# Patient Record
Sex: Female | Born: 1990 | Race: Black or African American | Hispanic: No | Marital: Single | Smoking: Never smoker
Health system: Southern US, Community
[De-identification: ages and names within clinical notes are randomized; demographics above are authoritative.]

## PROBLEM LIST (undated history)

## (undated) ENCOUNTER — Inpatient Hospital Stay (HOSPITAL_COMMUNITY): Payer: Self-pay

## (undated) DIAGNOSIS — N83209 Unspecified ovarian cyst, unspecified side: Secondary | ICD-10-CM

## (undated) DIAGNOSIS — R55 Syncope and collapse: Secondary | ICD-10-CM

## (undated) DIAGNOSIS — D649 Anemia, unspecified: Secondary | ICD-10-CM

## (undated) DIAGNOSIS — R011 Cardiac murmur, unspecified: Secondary | ICD-10-CM

## (undated) HISTORY — DX: Syncope and collapse: R55

## (undated) HISTORY — DX: Cardiac murmur, unspecified: R01.1

## (undated) HISTORY — PX: NO PAST SURGERIES: SHX2092

---

## 2011-06-13 ENCOUNTER — Emergency Department (HOSPITAL_COMMUNITY)
Admission: EM | Admit: 2011-06-13 | Discharge: 2011-06-13 | Disposition: A | Discharge status: Home / Self Care | Payer: Self-pay | Attending: Emergency Medicine | Admitting: Emergency Medicine

## 2011-06-13 ENCOUNTER — Encounter (HOSPITAL_COMMUNITY): Payer: Self-pay | Admitting: *Deleted

## 2011-06-13 ENCOUNTER — Other Ambulatory Visit: Payer: Self-pay

## 2011-06-13 ENCOUNTER — Emergency Department (HOSPITAL_COMMUNITY): Payer: Self-pay

## 2011-06-13 DIAGNOSIS — K219 Gastro-esophageal reflux disease without esophagitis: Secondary | ICD-10-CM | POA: Insufficient documentation

## 2011-06-13 DIAGNOSIS — R10816 Epigastric abdominal tenderness: Secondary | ICD-10-CM | POA: Insufficient documentation

## 2011-06-13 DIAGNOSIS — R0602 Shortness of breath: Secondary | ICD-10-CM | POA: Insufficient documentation

## 2011-06-13 DIAGNOSIS — R079 Chest pain, unspecified: Secondary | ICD-10-CM | POA: Insufficient documentation

## 2011-06-13 MED ORDER — GI COCKTAIL ~~LOC~~
30.0000 mL | Freq: Once | ORAL | Status: AC
  Administered 2011-06-13: 30 mL via ORAL
  Filled 2011-06-13: qty 30

## 2011-06-13 MED ORDER — FAMOTIDINE 20 MG PO TABS
40.0000 mg | ORAL_TABLET | Freq: Once | ORAL | Status: AC
  Administered 2011-06-13: 40 mg via ORAL
  Filled 2011-06-13: qty 1

## 2011-06-13 MED ORDER — SUCRALFATE 1 G PO TABS: 1.0000 g | ORAL_TABLET | Freq: Four times a day (QID) | ORAL | Status: DC

## 2011-06-13 MED ORDER — IBUPROFEN 800 MG PO TABS
800.0000 mg | ORAL_TABLET | Freq: Once | ORAL | Status: AC
  Administered 2011-06-13: 800 mg via ORAL
  Filled 2011-06-13: qty 1

## 2011-06-13 MED ORDER — PANTOPRAZOLE SODIUM 40 MG PO TBEC: 40.0000 mg | DELAYED_RELEASE_TABLET | Freq: Every day | ORAL | Status: DC

## 2011-06-13 NOTE — ED Notes (Signed)
Performed and gave pt EKG to A.Allen,EDP

## 2011-06-13 NOTE — ED Notes (Signed)
Pt from home c/o chest pain, denies radiation but states that "it is hard to breathe sometimes", started yesterday morning upon awakening, denies injury.

## 2011-06-13 NOTE — ED Provider Notes (Signed)
History     CSN: [id number]  Arrival date & time 06/13/11  1027   First MD Initiated Contact with Patient 06/13/11 1100      Chief Complaint  Patient presents with  . Chest Pain    (Consider location/radiation/quality/duration/timing/severity/associated sxs/prior treatment) Patient is a 21 y.o. female presenting with chest pain. The history is provided by the patient.  Chest Pain    patient here with epigastric pain x24 hours. No cough or fever. Pain is described as burning. No associated belching or increased flatus. No pleuritic pain. Denies any leg swelling. No treatment used prior to arrival no history of same. No vomiting or diarrhea or urinary symptoms.  History reviewed. No pertinent past medical history.  History reviewed. No pertinent past surgical history.  History reviewed. No pertinent family history.  History  Substance Use Topics  . Smoking status: Never Smoker   . Smokeless tobacco: Never Used  . Alcohol Use: No    OB History    Grav Para Term Preterm Abortions TAB SAB Ect Mult Living                  Review of Systems  Cardiovascular: Positive for chest pain.  All other systems reviewed and are negative.    Allergies  Review of patient's allergies indicates no known allergies.  Home Medications  No current outpatient prescriptions on file.  BP 107/66  Pulse 70  Temp(Src) 98.7 F (37.1 C) (Oral)  Resp 16  SpO2 100%  Physical Exam  Nursing note and vitals reviewed. Constitutional: She is oriented to person, place, and time. She appears well-developed and well-nourished.  Non-toxic appearance. No distress.  HENT:  Head: Normocephalic and atraumatic.  Eyes: Conjunctivae, EOM and lids are normal. Pupils are equal, round, and reactive to light.  Neck: Normal range of motion. Neck supple. No tracheal deviation present. No mass present.  Cardiovascular: Normal rate, regular rhythm and normal heart sounds.  Exam reveals no gallop.   No murmur  heard. Pulmonary/Chest: Effort normal and breath sounds normal. No stridor. No respiratory distress. She has no decreased breath sounds. She has no wheezes. She has no rhonchi. She has no rales.  Abdominal: Soft. Normal appearance and bowel sounds are normal. She exhibits no distension. There is tenderness in the epigastric area. There is no rebound and no CVA tenderness.  Musculoskeletal: Normal range of motion. She exhibits no edema and no tenderness.  Neurological: She is alert and oriented to person, place, and time. She has normal strength. No cranial nerve deficit or sensory deficit. GCS eye subscore is 4. GCS verbal subscore is 5. GCS motor subscore is 6.  Skin: Skin is warm and dry. No abrasion and no rash noted.  Psychiatric: She has a normal mood and affect. Her speech is normal and behavior is normal.    ED Course  Procedures (including critical care time)  Labs Reviewed - No data to display No results found.   No diagnosis found.    MDM   Date: 06/13/2011  Rate: 65  Rhythm: normal sinus rhythm  QRS Axis: normal  Intervals: normal  ST/T Wave abnormalities: normal  Conduction Disutrbances:none  Narrative Interpretation:   Old EKG Reviewed: none available  12:54 PM Pt given meds and feels better, suspect gerd, doubt pe--will give ppi      Toy Baker, MD 06/13/11 1255

## 2011-08-18 ENCOUNTER — Other Ambulatory Visit: Payer: Self-pay | Admitting: Infectious Diseases

## 2011-08-18 ENCOUNTER — Ambulatory Visit
Admission: RE | Admit: 2011-08-18 | Discharge: 2011-08-18 | Disposition: A | Discharge status: Home / Self Care | Payer: PRIVATE HEALTH INSURANCE | Source: Ambulatory Visit | Attending: Infectious Diseases | Admitting: Infectious Diseases

## 2011-08-18 DIAGNOSIS — R7611 Nonspecific reaction to tuberculin skin test without active tuberculosis: Secondary | ICD-10-CM

## 2011-12-10 ENCOUNTER — Encounter (HOSPITAL_COMMUNITY): Payer: Self-pay | Admitting: Emergency Medicine

## 2011-12-10 ENCOUNTER — Emergency Department (HOSPITAL_COMMUNITY)
Admission: EM | Admit: 2011-12-10 | Discharge: 2011-12-10 | Disposition: A | Discharge status: Home / Self Care | Payer: Self-pay | Attending: Emergency Medicine | Admitting: Emergency Medicine

## 2011-12-10 DIAGNOSIS — B86 Scabies: Secondary | ICD-10-CM | POA: Insufficient documentation

## 2011-12-10 MED ORDER — DIPHENHYDRAMINE HCL 25 MG PO TABS: 50.0000 mg | ORAL_TABLET | Freq: Four times a day (QID) | ORAL | Status: DC

## 2011-12-10 MED ORDER — PERMETHRIN 5 % EX CREA: TOPICAL_CREAM | CUTANEOUS | Status: AC

## 2011-12-10 NOTE — ED Provider Notes (Signed)
History     CSN: [id number]  Arrival date & time 12/10/11  1229   First MD Initiated Contact with Patient 12/10/11 1242      Chief Complaint  Patient presents with  . Rash    (Consider location/radiation/quality/duration/timing/severity/associated sxs/prior treatment) HPI Pt presents with c/o itchy rash.  Pt states rash began several days ago.  She initially thought they were small bug bites and are spreading.  Over arms, ankles, waist band.  No fever/chills.  She has not tried anything prior to arrival.  There are no other associated systemic symptoms, there are no other alleviating or modifying factors.  She has had no recent travel.  No household contacts have similar symptoms.   History reviewed. No pertinent past medical history.  History reviewed. No pertinent past surgical history.  History reviewed. No pertinent family history.  History  Substance Use Topics  . Smoking status: Never Smoker   . Smokeless tobacco: Never Used  . Alcohol Use: No    OB History    Grav Para Term Preterm Abortions TAB SAB Ect Mult Living                  Review of Systems ROS reviewed and all otherwise negative except for mentioned in HPI  Allergies  Review of patient's allergies indicates no known allergies.  Home Medications   Current Outpatient Rx  Name Route Sig Dispense Refill  . DIPHENHYDRAMINE HCL 25 MG PO TABS Oral Take 2 tablets (50 mg total) by mouth every 6 (six) hours. Take 1-2 tablets every 6 hours x 2 days, then space out to an as needed basis 20 tablet 0  . PERMETHRIN 5 % EX CREA  Apply to affected area once leave on 8-12, then rinse off 60 g 0    BP 116/72  Pulse 70  Temp 98.4 F (36.9 C) (Oral)  Resp 18  SpO2 100% Vitals reviewed Physical Exam Physical Examination: General appearance - alert, well appearing, and in no distress Mental status - alert, oriented to person, place, and time Eyes - no conjunctival injection, no scleral icterus Chest - clear to  auscultation, no wheezes, rales or rhonchi, symmetric air entry Heart - normal rate, regular rhythm, normal S1, S2, no murmurs, rubs, clicks or gallops Musculoskeletal - no joint tenderness, deformity or swelling Extremities - peripheral pulses normal, no pedal edema, no clubbing or cyanosis Skin - normal coloration, flesh colored papules over arms, lower back around waist band, ankles  ED Course  Procedures (including critical care time)  Labs Reviewed - No data to display No results found.   1. Scabies       MDM  Pt presentts with rash c/w scabies.  Pt given permethrin rx and explained its applications.  Discharged with strict return precautions.  Pt agreeable with plan.        Ethelda Chick, MD 12/11/11 1345

## 2011-12-10 NOTE — ED Notes (Signed)
Pt c/o generalized body rash with bug bites that are itchy x several days; pt sts mostly on arms and face

## 2012-02-27 ENCOUNTER — Emergency Department (HOSPITAL_COMMUNITY)
Admission: EM | Admit: 2012-02-27 | Discharge: 2012-02-27 | Disposition: A | Discharge status: Home / Self Care | Payer: Self-pay | Attending: Emergency Medicine | Admitting: Emergency Medicine

## 2012-02-27 ENCOUNTER — Encounter (HOSPITAL_COMMUNITY): Payer: Self-pay | Admitting: *Deleted

## 2012-02-27 DIAGNOSIS — N921 Excessive and frequent menstruation with irregular cycle: Secondary | ICD-10-CM | POA: Insufficient documentation

## 2012-02-27 LAB — POCT PREGNANCY, URINE: Preg Test, Ur: NEGATIVE

## 2012-02-27 NOTE — ED Notes (Signed)
Reports concern due to menstrual cycle stopped on Sept. 7th however, started back on the 11th and still present. Stated 4 days ago just started  having to use 1-2pad/day.

## 2012-02-27 NOTE — ED Notes (Signed)
Pt reports vaginal bleeding starting September 11th.  Pt reports periods are typically regular and on the 21st. Pt reports last menstrual cycle started on the 11th, stopped for two days and bleeding then began again.  Pt denies stomach pain or cramping.  Pt denies being sexually active.  Pt states blood amount is spotting- bright red/dark red in color.

## 2012-02-27 NOTE — ED Provider Notes (Signed)
History     CSN: [id number]  Arrival date & time 02/27/12  1305   First MD Initiated Contact with Patient 02/27/12 1352      Chief Complaint  Patient presents with  . Vaginal Bleeding    (Consider location/radiation/quality/duration/timing/severity/associated sxs/prior treatment) The history is provided by the patient.  pt c/o irregular periods.   She denies pain.  She denies n/v. She denies uti sxs or vag discharge.  She is not sexually active.  She denies hx of similar sxs. She denies fh of fibroids, endometriosis or other gyn dz.   She has not had bleeding anywhere else. She denies rash or bruise.  History reviewed. No pertinent past medical history.  History reviewed. No pertinent past surgical history.  No family history on file.  History  Substance Use Topics  . Smoking status: Never Smoker   . Smokeless tobacco: Never Used  . Alcohol Use: No    OB History    Grav Para Term Preterm Abortions TAB SAB Ect Mult Living                  Review of Systems  Constitutional: Negative for fever and chills.  HENT: Negative for nosebleeds.   Respiratory: Negative for shortness of breath.   Cardiovascular: Negative for chest pain and palpitations.  Gastrointestinal: Negative for abdominal pain and blood in stool.  Genitourinary: Negative for hematuria.  Musculoskeletal: Negative for back pain.  Skin: Negative for pallor and rash.  Neurological: Negative for weakness.  Hematological: Does not bruise/bleed easily.  All other systems reviewed and are negative.    Allergies  Review of patient's allergies indicates no known allergies.  Home Medications  No current outpatient prescriptions on file.  BP 122/70  Pulse 89  Temp 99.4 F (37.4 C) (Oral)  Resp 19  SpO2 100%  LMP 02/09/2012  Physical Exam  Nursing note and vitals reviewed. Constitutional: She is oriented to person, place, and time. She appears well-developed and well-nourished.  HENT:  Head:  Normocephalic and atraumatic.  Eyes: Conjunctivae normal are normal.       No conjunctival pallor  Neck: Normal range of motion.  Cardiovascular: Normal rate, regular rhythm and intact distal pulses.   No murmur heard.      CRF < 2 sec  Abdominal: Soft. Bowel sounds are normal. She exhibits no distension and no mass. There is no tenderness. There is no rebound and no guarding.  Genitourinary:       Pelvic done with female med tech  Ext vagina nl.  Min amt of old brown vag bleeding. Os closed. No adnexal masses or cmt.  No vag discharge.  Musculoskeletal: Normal range of motion. She exhibits no edema.  Neurological: She is alert and oriented to person, place, and time.  Skin: Skin is warm and dry. No pallor.  Psychiatric: She has a normal mood and affect. Thought content normal.    ED Course  Procedures (including critical care time) 40 y female with irregular vag bleeding. No orthostatic sxs. No pain. Not sexually active. Not pregnant.    Labs Reviewed  POCT PREGNANCY, URINE   No results found.   No diagnosis found.    MDM  Irregular menses        Cheri Guppy, MD 02/27/12 1425  Cheri Guppy, MD 02/27/12 1446

## 2014-11-20 ENCOUNTER — Ambulatory Visit (INDEPENDENT_AMBULATORY_CARE_PROVIDER_SITE_OTHER): Payer: 59 | Admitting: Physician Assistant

## 2014-11-20 VITALS — BP 106/68 | HR 58 | Temp 98.5°F | Resp 18 | Ht 61.5 in | Wt 117.6 lb

## 2014-11-20 DIAGNOSIS — Z23 Encounter for immunization: Secondary | ICD-10-CM | POA: Diagnosis not present

## 2014-11-20 DIAGNOSIS — R6889 Other general symptoms and signs: Secondary | ICD-10-CM | POA: Diagnosis not present

## 2014-11-20 LAB — POCT CBC
GRANULOCYTE PERCENT: 56.7 % (ref 37–80)
HCT, POC: 40.8 % (ref 37.7–47.9)
Hemoglobin: 13.2 g/dL (ref 12.2–16.2)
Lymph, poc: 2.3 (ref 0.6–3.4)
MCH, POC: 29.4 pg (ref 27–31.2)
MCHC: 32.3 g/dL (ref 31.8–35.4)
MCV: 91 fL (ref 80–97)
MID (CBC): 0.2 (ref 0–0.9)
MPV: 8.7 fL (ref 0–99.8)
PLATELET COUNT, POC: 213 10*3/uL (ref 142–424)
POC GRANULOCYTE: 3.2 (ref 2–6.9)
POC LYMPH %: 40.6 % (ref 10–50)
POC MID %: 2.7 % (ref 0–12)
RBC: 4.48 M/uL (ref 4.04–5.48)
RDW, POC: 13.5 %
WBC: 5.7 10*3/uL (ref 4.6–10.2)

## 2014-11-20 LAB — TSH: TSH: 0.752 u[IU]/mL (ref 0.350–4.500)

## 2014-11-20 NOTE — Patient Instructions (Addendum)
You are not anemic. I will call you with results of thyroid test. Return with further problems/concerns.  Tdap Vaccine (Tetanus, Diphtheria, Pertussis): What You Need to Know 1. Why get vaccinated? Tetanus, diphtheria and pertussis can be very serious diseases, even for adolescents and adults. Tdap vaccine can protect [place] from these diseases. TETANUS (Lockjaw) causes painful muscle tightening and stiffness, usually all over the body.  It can lead to tightening of muscles in the head and neck so you can't open your mouth, swallow, or sometimes even breathe. Tetanus kills about 1 out of 5 people who are infected. DIPHTHERIA can cause a thick coating to form in the back of the throat.  It can lead to breathing problems, paralysis, heart failure, and death. PERTUSSIS (Whooping Cough) causes severe coughing spells, which can cause difficulty breathing, vomiting and disturbed sleep.  It can also lead to weight loss, incontinence, and rib fractures. Up to 2 in 100 adolescents and 5 in 100 adults with pertussis are hospitalized or have complications, which could include pneumonia or death. These diseases are caused by bacteria. Diphtheria and pertussis are spread from person to person through coughing or sneezing. Tetanus enters the body through cuts, scratches, or wounds. Before vaccines, the [place] saw as many as 200,000 cases a year of diphtheria and pertussis, and hundreds of cases of tetanus. Since vaccination began, tetanus and diphtheria have dropped by about 99% and pertussis by about 80%. 2. Tdap vaccine Tdap vaccine can protect adolescents and adults from tetanus, diphtheria, and pertussis. One dose of Tdap is routinely given at age 53 or 58. People who did not get Tdap at that age should get it as soon as possible. Tdap is especially important for health care professionals and anyone having close contact with a baby younger than 12 months. Pregnant women should get a dose of Tdap during  every pregnancy, to protect the newborn from pertussis. Infants are most at risk for severe, life-threatening complications from pertussis. A similar vaccine, called Td, protects from tetanus and diphtheria, but not pertussis. A Td booster should be given every 10 years. Tdap may be given as one of these boosters if you have not already gotten a dose. Tdap may also be given after a severe cut or burn to prevent tetanus infection. Your doctor can give you more information. Tdap may safely be given at the same time as other vaccines. 3. Some people should not get this vaccine  If you ever had a life-threatening allergic reaction after a dose of any tetanus, diphtheria, or pertussis containing vaccine, OR if you have a severe allergy to any part of this vaccine, you should not get Tdap. Tell your doctor if you have any severe allergies.  If you had a coma, or long or multiple seizures within 7 days after a childhood dose of DTP or DTaP, you should not get Tdap, unless a cause other than the vaccine was found. You can still get Td.  Talk to your doctor if you:  have epilepsy or another nervous system problem,  had severe pain or swelling after any vaccine containing diphtheria, tetanus or pertussis,  ever had Guillain-Barr Syndrome (GBS),  aren't feeling well on the day the shot is scheduled. 4. Risks of a vaccine reaction With any medicine, including vaccines, there is a chance of side effects. These are usually mild and go away on their own, but serious reactions are also possible. Brief fainting spells can follow a vaccination, leading to injuries from falling.  Sitting or lying down for about 15 minutes can help prevent these. Tell your doctor if you feel dizzy or light-headed, or have vision changes or ringing in the ears. Mild problems following Tdap (Did not interfere with activities)  Pain where the shot was given (about 3 in 4 adolescents or 2 in 3 adults)  Redness or swelling where  the shot was given (about 1 person in 5)  Mild fever of at least 100.31F (up to about 1 in 25 adolescents or 1 in 100 adults)  Headache (about 3 or 4 people in 10)  Tiredness (about 1 person in 3 or 4)  Nausea, vomiting, diarrhea, stomach ache (up to 1 in 4 adolescents or 1 in 10 adults)  Chills, body aches, sore joints, rash, swollen glands (uncommon) Moderate problems following Tdap (Interfered with activities, but did not require medical attention)  Pain where the shot was given (about 1 in 5 adolescents or 1 in 100 adults)  Redness or swelling where the shot was given (up to about 1 in 16 adolescents or 1 in 25 adults)  Fever over 102F (about 1 in 100 adolescents or 1 in 250 adults)  Headache (about 3 in 20 adolescents or 1 in 10 adults)  Nausea, vomiting, diarrhea, stomach ache (up to 1 or 3 people in 100)  Swelling of the entire arm where the shot was given (up to about 3 in 100). Severe problems following Tdap (Unable to perform usual activities; required medical attention)  Swelling, severe pain, bleeding and redness in the arm where the shot was given (rare). A severe allergic reaction could occur after any vaccine (estimated less than 1 in a million doses). 5. What if there is a serious reaction? What should I look for?  Look for anything that concerns you, such as signs of a severe allergic reaction, very high fever, or behavior changes. Signs of a severe allergic reaction can include hives, swelling of the face and throat, difficulty breathing, a fast heartbeat, dizziness, and weakness. These would start a few minutes to a few hours after the vaccination. What should I do?  If you think it is a severe allergic reaction or other emergency that can't wait, call 9-1-1 or get the person to the nearest hospital. Otherwise, call your doctor.  Afterward, the reaction should be reported to the "Vaccine Adverse Event Reporting System" (VAERS). Your doctor might file this  report, or you can do it yourself through the VAERS web site at www.vaers.LAgents.no, or by calling 1-847-048-5100. VAERS is only for reporting reactions. They do not give medical advice.  6. The National Vaccine Injury Compensation Program The [employer] Vaccine Injury Compensation Program (VICP) is a federal program that was created to compensate people who may have been injured by certain vaccines. Persons who believe they may have been injured by a vaccine can learn about the program and about filing a claim by calling 1-(714)726-9167 or visiting the VICP website at SpiritualWord.at. 7. How can I learn more?  Ask your doctor.  Call your local or state health department.  Contact the Centers for Disease Control and Prevention (CDC):  Call 225-076-3117 or visit CDC's website at PicCapture.uy. CDC Tdap Vaccine VIS (10/07/11) Document Released: 11/16/2011 Document Revised: 10/01/2013 Document Reviewed: 08/29/2013 ExitCare Patient Information 2015 [place], [place]. This information is not intended to replace advice given to you by your health care provider. Make sure you discuss any questions you have with your health care provider. Hepatitis A Vaccine: What You Need to  Know 1. What is hepatitis A? Hepatitis A is a serious liver disease caused by the hepatitis A virus (HAV). HAV is found in the stool of people with hepatitis A. It is usually spread by close personal contact and sometimes by eating food or drinking water containing HAV. A person who has hepatitis A can easily pass the disease to others within the same household. Hepatitis A can cause:  "flu-like" illness  jaundice (yellow skin or eyes, dark urine)  severe stomach pains and diarrhea (children) People with hepatitis A often have to be hospitalized (up to about 1 person in 5). Adults with hepatitis A are often too ill to work for up to a month. Sometimes, people die as a result of hepatitis A (about 3-6  deaths per 1,000 cases). Hepatitis A vaccine can prevent hepatitis A. 2. Who should get hepatitis A vaccine and when? WHO Some people should be routinely vaccinated with hepatitis A vaccine:  All children between their first and second birthdays (31 through 39 months of age).  Anyone 1 year of age and older traveling to or working in countries with high or intermediate prevalence of hepatitis A, such as those located in [place] or [place], [place], [place] (except [place]), [place], and [place]. For more information see http://www.church.org/.  Children and adolescents 2 through 50 years of age who live in states or communities where routine vaccination has been implemented because of high disease incidence.  Men who have sex with men.  People who use street drugs.  People with chronic liver disease.  People who are treated with clotting factor concentrates.  People who work with HAV-infected primates or who work with HAV in Music therapist.  Members of households planning to adopt a child, or care for a newly arriving adopted child, from a country where hepatitis A is common. Other people might get hepatitis A vaccine in certain situations (ask your doctor for more details):  Unvaccinated children or adolescents in communities where outbreaks of hepatitis A are occurring.  Unvaccinated people who have been exposed to hepatitis A virus.  Anyone 1 year of age or older who wants protection from hepatitis A. Hepatitis A vaccine is not licensed for children younger than 1 year of age. WHEN For children, the first dose should be given at 36 through 30 months of age. Children who are not vaccinated by 81 years of age can be vaccinated at later visits. For others at risk, the hepatitis A vaccine series may be started whenever a person wishes to be protected or is at risk of infection. For travelers, it is best to start the vaccine series at least 1 month before traveling. (Some  protection may still result if the vaccine is given on or closer to the travel date.) Some people who cannot get the vaccine before traveling, or for whom the vaccine might not be effective, can get a shot called immune globulin (IG). IG gives immediate, temporary protection. Two doses of the vaccine are needed for lasting protection. These doses should be given at least 6 months apart. Hepatitis A vaccine may be given at the same time as other vaccines. 3. Some people should not get hepatitis A vaccine or should wait.  Anyone who has ever had a severe (life threatening) allergic reaction to a previous dose of hepatitis A vaccine should not get another dose.  Anyone who has a severe (life threatening) allergy to any vaccine component should not get the vaccine.  Tell your doctor if  you have any severe allergies, including a severe allergy to latex. All hepatitis A vaccines contain alum, and some hepatitis A vaccines contain 2-phenoxyethanol.  Anyone who is moderately or severely ill at the time the shot is scheduled should probably wait until they recover. Ask your doctor. People with a mild illness can usually get the vaccine.  Tell your doctor if you are pregnant. Because hepatitis A vaccine is inactivated (killed), the risk to a pregnant woman or her unborn baby is believed to be very low. But your doctor can weigh any theoretical risk from the vaccine against the need for protection. 4. What are the risks from hepatitis A vaccine? A vaccine, like any medicine, could possibly cause serious problems, such as severe allergic reactions. The risk of hepatitis A vaccine causing serious harm, or death, is extremely small. Getting hepatitis A vaccine is much safer than getting the disease. Mild problems  soreness where the shot was given (about 1 out of 2 adults, and up to 1 out of 6 children)  headache (about 1 out of 6 adults and 1 out of 25 children)  loss of appetite (about 1 out of 12  children)  tiredness (about 1 out of 14 adults) If these problems occur, they usually last 1 or 2 days. Severe problems  serious allergic reaction, within a few minutes to a few hours after the shot (very rare). 5. What if there is a serious reaction? What should I look for?  Look for anything that concerns you, such as signs of a severe allergic reaction, very high fever, or behavior changes. Signs of a severe allergic reaction can include hives, swelling of the face and throat, difficulty breathing, a fast heartbeat, dizziness, and weakness. These would start a few minutes to a few hours after the vaccination. What should I do?  If you think it is a severe allergic reaction or other emergency that can't wait, call 9-1-1 or get the person to the nearest hospital. Otherwise, call your doctor.  Afterward, the reaction should be reported to the Vaccine Adverse Event Reporting System (VAERS). Your doctor might file this report, or you can do it yourself through the VAERS web site at www.vaers.LAgents.no, or by calling 1-830-152-6042. VAERS is only for reporting reactions. They do not give medical advice. 6. The National Vaccine Injury Compensation Program The [employer] Vaccine Injury Compensation Program (VICP) is a federal program that was created to compensate people who may have been injured by certain vaccines. Persons who believe they may have been injured by a vaccine can learn about the program and about filing a claim by calling 1-(801) 359-1414 or visiting the VICP website at SpiritualWord.at. 7. How can I learn more?  Ask your doctor.  Call your local or state health department.  Contact the Centers for Disease Control and Prevention (CDC):  Call 564-019-5625 (1-800-CDC-INFO) or  Visit CDC's website at: PicCapture.uy CDC Hepatitis A Vaccine VIS (Interim) (03/24/10)  Document Released: 03/11/2006 Document Revised: 10/01/2013 Document Reviewed:  06/28/2013 ExitCare Patient Information 2015 [place], [place]. This information is not intended to replace advice given to you by your health care provider. Make sure you discuss any questions you have with your health care provider.

## 2014-11-20 NOTE — Progress Notes (Signed)
[facility] [address] [place] [address] [phone]  Date:  11/20/2014   Name:  Shirley Baker   DOB:  01-17-1991   MRN:  [id number]  PCP:  No primary care provider on file.    Chief Complaint: Immunizations and Labs Only   History of Present Illness:  This is a 24 y.o. female who is presenting needing vaccinations and blood work. Has been getting vaccinations from [facility]. She was informed recently that she was due for hep A and tdap.   Pt also wanting lab work to make sure she is not anemic. She states that for "forever" she notices she gets cold easily. She denies fatigue, palpitations, edema. She has never been told she was anemic before. She has not personal or family history of thyroid issues.  Review of Systems:  Review of Systems See HPI  There are no active problems to display for this patient.   Prior to Admission medications   Not on File    No Known Allergies  History reviewed. No pertinent past surgical history.  History  Substance Use Topics  . Smoking status: Never Smoker   . Smokeless tobacco: Never Used  . Alcohol Use: No    History reviewed. No pertinent family history.  Medication list has been reviewed and updated.  Physical Examination:  Physical Exam  Constitutional: She is oriented to person, place, and time. She appears well-developed and well-nourished. No distress.  HENT:  Head: Normocephalic and atraumatic.  Right Ear: Hearing normal.  Left Ear: Hearing normal.  Nose: Nose normal.  Mouth/Throat: Uvula is midline, oropharynx is clear and moist and mucous membranes are normal.  Eyes: Conjunctivae and lids are normal. Right eye exhibits no discharge. Left eye exhibits no discharge. No scleral icterus.  Conjunctiva normal, no pallor  Cardiovascular: Normal rate, regular rhythm, normal heart sounds and normal pulses.   No murmur heard. Pulmonary/Chest: Effort normal and breath sounds  normal. No respiratory distress. She has no wheezes. She has no rhonchi. She has no rales.  Musculoskeletal: Normal range of motion.  Neurological: She is alert and oriented to person, place, and time.  Skin: Skin is warm, dry and intact. No lesion and no rash noted.  Psychiatric: She has a normal mood and affect. Her speech is normal and behavior is normal. Thought content normal.   BP 106/68 mmHg  Pulse 58  Temp(Src) 98.5 F (36.9 C) (Oral)  Resp 18  Ht 5' 1.5" (1.562 m)  Wt 117 lb 9.6 oz (53.343 kg)  BMI 21.86 kg/m2  SpO2 98%  Results for orders placed or performed in visit on 11/20/14  POCT CBC  Result Value Ref Range   WBC 5.7 4.6 - 10.2 K/uL   Lymph, poc 2.3 0.6 - 3.4   POC LYMPH PERCENT 40.6 10 - 50 %L   MID (cbc) 0.2 0 - 0.9   POC MID % 2.7 0 - 12 %M   POC Granulocyte 3.2 2 - 6.9   Granulocyte percent 56.7 37 - 80 %G   RBC 4.48 4.04 - 5.48 M/uL   Hemoglobin 13.2 12.2 - 16.2 g/dL   HCT, POC 29.5 62.1 - 47.9 %   MCV 91.0 80 - 97 fL   MCH, POC 29.4 27 - 31.2 pg   MCHC 32.3 31.8 - 35.4 g/dL   RDW, POC 30.8 %   Platelet Count, POC 213 142 - 424 K/uL   MPV 8.7 0 - 99.8 fL  Assessment and Plan:  1. Need for hepatitis A immunization - Hepatitis A vaccine adult IM  2. Need for Tdap vaccination - Tdap vaccine greater than or equal to 7yo IM  3. Cold intolerance Not anemic. TSH pending. - POCT CBC - TSH   Roswell Miners. Dyke Brackett, MHS [facility]  11/20/2014

## 2014-12-25 ENCOUNTER — Telehealth: Payer: Self-pay

## 2014-12-25 NOTE — Telephone Encounter (Signed)
Yes, let's have her return to clinic prior to the referral so we can get more information and get some lab work completed prior to sending her to a specialist.

## 2014-12-25 NOTE — Telephone Encounter (Signed)
Does pt need to RTC? 

## 2014-12-25 NOTE — Telephone Encounter (Addendum)
Pt would like to be referred to a Fertility Clinic. Please call [phone]

## 2014-12-26 ENCOUNTER — Ambulatory Visit (INDEPENDENT_AMBULATORY_CARE_PROVIDER_SITE_OTHER): Payer: 59 | Admitting: Family Medicine

## 2014-12-26 VITALS — BP 102/64 | HR 65 | Temp 98.2°F | Resp 14 | Ht 61.5 in | Wt 118.6 lb

## 2014-12-26 DIAGNOSIS — Z3161 Procreative counseling and advice using natural family planning: Secondary | ICD-10-CM

## 2014-12-26 NOTE — Progress Notes (Signed)
[facility] [address], [place] [address] [phone]  Date:  12/26/2014   Name:  Shirley Baker   DOB:  01-12-1991   MRN:  [id number]  PCP:  No primary care provider on file.    Chief Complaint: Referral and Labs Only   History of Present Illness:  Shirley Baker is a 24 y.o. very pleasant female patient who presents with the following: Counseling/labs regarding infertility. Pt states she has been trying to get pregnant with her husband for 6 months. Pt used to use explanon for contraception and had it removed 6-7 months ago. LMP 11/25/14. Took a pregnancy test this morning which was negative. She has symptoms of ovulation each month - tender breasts, abdominal cramping. Pt denies any medical conditions that she knows of. She has had regular periods for 6 months now, since having the explanon removed. Pt states that, to her knowledge, her mother and grandmother did not have infertility issues. Pt takes an OTC MV and no other medications. Pt receives an annual well woman check at the local health department.  There are no active problems to display for this patient.  History reviewed. No pertinent past medical history.  History reviewed. No pertinent past surgical history.  History  Substance Use Topics  . Smoking status: Never Smoker   . Smokeless tobacco: Never Used  . Alcohol Use: No    History reviewed. No pertinent family history.  No Known Allergies  Medication list has been reviewed and updated.  No current outpatient prescriptions on file prior to visit.   No current facility-administered medications on file prior to visit.    Review of Systems: Const: denies fever, chills, night sweats HEENT: denies visual changes, photophobia, hearing loss, ear pain, nasal congestion CV: denies CP and palpitations Pulm: denies cough and SOB GI: denies abdominal pain, N/V/D GU: denies dysuria, hematuria, or vaginal discharge Neuro: denies HA and  weakness Skin: denies skin changes/rashes  Physical Examination: HEENT: PERRLA  Neck: trachea is midline, no thyromegaly, no cervical LAD CV: heart RRR no m/r/g, distal pulses in tact Pulm: CTABL, no wheezes or rhonchi Abd: abdomen is soft, non-tender, non-distended with bowel sounds in all 4 quadrants, no masses Skin: warm, dry, no lesions noted  Filed Vitals:   12/26/14 1142  BP: 102/64  Pulse: 65  Temp: 98.2 F (36.8 C)  Resp: 14   Filed Vitals:   12/26/14 1142  Height: 5' 1.5" (1.562 m)  Weight: 118 lb 9.6 oz (53.797 kg)   Body mass index is 22.05 kg/(m^2). Ideal Body Weight: Weight in (lb) to have BMI = 25: 134.2  Assessment and Plan: Counseling about natural family planning reassurance that it often takes healthy fertile couples 6-12 months to conceive  -begin taking an OTC prenatal vitamin -patient resources: cycle beads app on iphone to track ovulation, "Taking Charge of Your Fertility" book -call or come back to clinic with any questions or concenrs -FU in 6 months for re-evaluation/referral if still not pregnant.  She may certainly call and I will refer her  Signed Abbe Amsterdam, MD

## 2014-12-26 NOTE — Patient Instructions (Signed)
Please start on pre-natal vitamins now Please give [place] a call if you are still not pregnant in about 6 months and I will refer you to a fertility specialist You might try a book called "Taking Charge of your Fertility" to learn more about your fertility cycle

## 2014-12-26 NOTE — Telephone Encounter (Signed)
LMOM to RTC. 

## 2014-12-30 ENCOUNTER — Ambulatory Visit (INDEPENDENT_AMBULATORY_CARE_PROVIDER_SITE_OTHER): Payer: 59 | Admitting: Family Medicine

## 2014-12-30 VITALS — BP 120/76 | HR 64 | Temp 98.4°F | Resp 16 | Ht 61.0 in | Wt 119.2 lb

## 2014-12-30 DIAGNOSIS — R220 Localized swelling, mass and lump, head: Secondary | ICD-10-CM | POA: Diagnosis not present

## 2014-12-30 DIAGNOSIS — R070 Pain in throat: Secondary | ICD-10-CM

## 2014-12-30 LAB — POCT RAPID STREP A (OFFICE): Rapid Strep A Screen: NEGATIVE

## 2014-12-30 MED ORDER — PENICILLIN G BENZATHINE 1200000 UNIT/2ML IM SUSP: 2.4000 10*6.[IU] | Freq: Once | INTRAMUSCULAR | Status: DC

## 2014-12-30 MED ORDER — AMOXICILLIN 875 MG PO TABS: 875.0000 mg | ORAL_TABLET | Freq: Two times a day (BID) | ORAL | Status: DC

## 2014-12-30 MED ORDER — PENICILLIN G BENZATHINE 1200000 UNIT/2ML IM SUSP
1.2000 10*6.[IU] | Freq: Once | INTRAMUSCULAR | Status: AC
  Administered 2014-12-30: 1.2 10*6.[IU] via INTRAMUSCULAR

## 2014-12-30 NOTE — Patient Instructions (Addendum)

## 2014-12-30 NOTE — Progress Notes (Signed)
[facility] [address] [place] [address] [phone]  Date:  12/30/2014   Name:  Shirley Baker   DOB:  August 07, 1990   MRN:  [id number]  PCP:  No primary care provider on file.    History of Present Illness:  Shirley Baker is a 24 y.o. female patient who presents to [facility] for chief complaint of tooth pain at the back of her mouth that started 3 days ago.  She rates the pain 10/10 at times.  It hurts to swallow and open mouth.  She has been drooling.  There is no sob or dyspnea.  She has tried advil which has not helped.  She has no UR sxs, nasal congestion, or cough.      There are no active problems to display for this patient.   History reviewed. No pertinent past medical history.  History reviewed. No pertinent past surgical history.  History  Substance Use Topics  . Smoking status: Never Smoker   . Smokeless tobacco: Never Used  . Alcohol Use: No    History reviewed. No pertinent family history.  No Known Allergies  Medication list has been reviewed and updated.  No current outpatient prescriptions on file prior to visit.   No current facility-administered medications on file prior to visit.    ROS ROS otherwise unremarkable unless listed above.    Physical Examination: BP 120/76 mmHg  Pulse 64  Temp(Src) 98.4 F (36.9 C) (Oral)  Resp 16  Ht  (1.549 m)  Wt 119 lb 3.2 oz (54.069 kg)  BMI 22.53 kg/m2  SpO2 96%  LMP 12/30/2014 Ideal Body Weight: Weight in (lb) to have BMI = 25: 132  Physical Exam  Constitutional: She is oriented to person, place, and time. She appears well-developed and well-nourished. No distress.  HENT:  Head: Normocephalic and atraumatic.  Right Ear: Tympanic membrane, external ear and ear canal normal.  Left Ear: Tympanic membrane, external ear and ear canal normal.  Nose: No mucosal edema or rhinorrhea.  Mouth/Throat: No uvula swelling. No oropharyngeal exudate, posterior oropharyngeal edema or posterior  oropharyngeal erythema.  Posterior gum swelling of left side.  Tender without drainage.  There is no erythema.  Uvula is midline.    Pulmonary/Chest: Breath sounds normal. No apnea. No respiratory distress. She has no decreased breath sounds. She has no wheezes. She has no rhonchi.  Lymphadenopathy:       Head (right side): No submental, no submandibular, no tonsillar, no preauricular and no posterior auricular adenopathy present.       Head (left side): No submental, no submandibular, no tonsillar, no preauricular and no posterior auricular adenopathy present.    She has cervical adenopathy.       Left cervical: Superficial cervical adenopathy present.  Neurological: She is alert and oriented to person, place, and time.  Skin: She is not diaphoretic.  Psychiatric: She has a normal mood and affect. Her speech is normal and behavior is normal.     Assessment and Plan: 24 year old female is here today for chief complaint of mouth pain.  There is gum swelling, and I have advised that she seek dental referral.  Bicillin given.  Starting on amoxicillin at this time.  Alarming sxs given that demand immediate return.  RTC in 3 days.    Trena Platt, PA-C [facility] 12/30/2014 2:43 PM

## 2015-01-01 LAB — CULTURE, GROUP A STREP: Organism ID, Bacteria: NORMAL

## 2015-01-02 ENCOUNTER — Ambulatory Visit (INDEPENDENT_AMBULATORY_CARE_PROVIDER_SITE_OTHER): Payer: 59 | Admitting: Family Medicine

## 2015-01-02 VITALS — BP 108/74 | HR 73 | Temp 98.6°F | Resp 16 | Ht 61.0 in | Wt 116.0 lb

## 2015-01-02 DIAGNOSIS — K088 Other specified disorders of teeth and supporting structures: Secondary | ICD-10-CM | POA: Diagnosis not present

## 2015-01-02 DIAGNOSIS — R220 Localized swelling, mass and lump, head: Secondary | ICD-10-CM

## 2015-01-02 DIAGNOSIS — K0889 Other specified disorders of teeth and supporting structures: Secondary | ICD-10-CM

## 2015-01-02 MED ORDER — CLINDAMYCIN HCL 300 MG PO CAPS: 300.0000 mg | ORAL_CAPSULE | Freq: Three times a day (TID) | ORAL | Status: AC

## 2015-01-02 NOTE — Addendum Note (Signed)
Addended by: Trena Platt D on: 01/02/2015 08:37 PM   Modules accepted: Level of Service

## 2015-01-02 NOTE — Patient Instructions (Signed)
Please attend your dental appointment, as this appears to be the root of your problem.   You can take ibuprofen  every 4 hours for pain.

## 2015-01-02 NOTE — Progress Notes (Signed)
[facility] [address], [place] [address] [phone]  Date:  01/02/2015   Name:  Shirley Baker   DOB:  [birth year]   MRN:  [id number]  PCP:  Shirley Edin, MD    History of Present Illness:  Shirley Baker is a 24 y.o. female patient who presents to [facility] for follow up of mouth pain.  She was having swelling and pain 5 days ago at the left side of her mouth to her neck.  She was treated with bicillin and amoxicillin, and advised to rtc if her sxs did not resolve.  She states that she continues to have the mouth pain.  It is difficult to eat, swallow, and open her mouth due to the pain.  She has no fever.  She has not taken the amoxicillin, however she does report a rash that itches along her face.  It is mildly pruritic.  She has taken nothing for this.  At last visit, I had asked that she contact a dentist.  She states that it was difficult to find an appointment with schedule.  No sob, dyspnea, fever.          There are no active problems to display for this patient.   No past medical history on file.  No past surgical history on file.  History  Substance Use Topics  . Smoking status: Never Smoker   . Smokeless tobacco: Never Used  . Alcohol Use: No    No family history on file.  No Known Allergies  Medication list has been reviewed and updated.  No current outpatient prescriptions on file prior to visit.   No current facility-administered medications on file prior to visit.    ROS ROS otherwise unremarkable unless listed above.   Physical Examination: BP 108/74 mmHg  Pulse 73  Temp(Src) 98.6 F (37 C) (Oral)  Resp 16  Ht 5\' 1"  (1.549 m)  Wt 116 lb (52.617 kg)  BMI 21.93 kg/m2  SpO2 99%  LMP 12/30/2014 Ideal Body Weight: Weight in (lb) to have BMI = 25: 132  Physical Exam  Constitutional: She is oriented to person, place, and time. She appears well-developed and well-nourished. No distress.  HENT:  Head: Normocephalic and atraumatic.    Mouth/Throat: No uvula swelling. No oropharyngeal exudate, posterior oropharyngeal edema or posterior oropharyngeal erythema.    There is generous swelling of the gumline over just above most posterior molar (3rd).  It is tender to the touch and without drainage.  Oral mucosa is swollen and hangs over the the tooth, erythematous and raw, and is in the line of teeth when she bites down.    Eyes: Conjunctivae and EOM are normal. Pupils are equal, round, and reactive to light. Right eye exhibits no discharge. Left eye exhibits no discharge.  Neck: Normal range of motion.  Cardiovascular: Normal rate and regular rhythm.   Pulmonary/Chest: Effort normal and breath sounds normal. No respiratory distress. She has no wheezes.  Neurological: She is alert and oriented to person, place, and time.  Skin: Rash (Tiny papular rash along cheeks, forehead, and beneath eyes.  ) noted. She is not diaphoretic.  Psychiatric: She has a normal mood and affect. Her behavior is normal.     Assessment and Plan: 24 year old female is here today for chief complaint of mouth pain and rash.  This appears to be possible exposure to allergen, heat, or the abx.  I will give her clindamycin at this time.  Discontinue the amoxicillin to avoid possible pnc allergen.  I have advised her to contact a dentist.  She now has an appointment for tomorrow at 2: 45pm.  I have advised her to take ibuprofen  every 6 hours for pain.  She may likely need to have back molars removed at this time, but dental specialist is involved at this time.   1. Swelling of gums - clindamycin (CLEOCIN) 300 MG capsule; Take 1 capsule (300 mg total) by mouth 3 (three) times daily.  Dispense: 21 capsule; Refill: 0  2. Tooth pain - clindamycin (CLEOCIN) 300 MG capsule; Take 1 capsule (300 mg total) by mouth 3 (three) times daily.  Dispense: 21 capsule; Refill: 0   Trena Platt, PA-C [facility] 01/02/2015 7:58 PM

## 2015-01-02 NOTE — Progress Notes (Signed)
  Medical screening examination/treatment/procedure(s) were performed by non-physician practitioner and as supervising physician I was immediately available for consultation/collaboration.     

## 2015-01-03 NOTE — Progress Notes (Signed)
Patient was examined by me.  The recorded information has been reviewed and considered. Dr Conley Rolls

## 2015-01-15 ENCOUNTER — Ambulatory Visit: Payer: 59 | Admitting: Family Medicine

## 2015-02-10 ENCOUNTER — Encounter: Payer: 59 | Admitting: Family Medicine

## 2015-03-04 ENCOUNTER — Encounter: Payer: Self-pay | Admitting: Emergency Medicine

## 2015-03-17 ENCOUNTER — Inpatient Hospital Stay (HOSPITAL_COMMUNITY)
Admission: AD | Admit: 2015-03-17 | Discharge: 2015-03-17 | Disposition: A | Discharge status: Home / Self Care | Payer: Medicaid Other | Source: Ambulatory Visit | Attending: Family Medicine | Admitting: Family Medicine

## 2015-03-17 ENCOUNTER — Encounter (HOSPITAL_COMMUNITY): Payer: Self-pay | Admitting: *Deleted

## 2015-03-17 ENCOUNTER — Inpatient Hospital Stay (HOSPITAL_COMMUNITY): Payer: Medicaid Other

## 2015-03-17 DIAGNOSIS — Z3A01 Less than 8 weeks gestation of pregnancy: Secondary | ICD-10-CM | POA: Diagnosis not present

## 2015-03-17 DIAGNOSIS — O26899 Other specified pregnancy related conditions, unspecified trimester: Secondary | ICD-10-CM

## 2015-03-17 DIAGNOSIS — O26891 Other specified pregnancy related conditions, first trimester: Secondary | ICD-10-CM | POA: Insufficient documentation

## 2015-03-17 DIAGNOSIS — R109 Unspecified abdominal pain: Secondary | ICD-10-CM

## 2015-03-17 DIAGNOSIS — O9989 Other specified diseases and conditions complicating pregnancy, childbirth and the puerperium: Secondary | ICD-10-CM

## 2015-03-17 DIAGNOSIS — O3680X0 Pregnancy with inconclusive fetal viability, not applicable or unspecified: Secondary | ICD-10-CM

## 2015-03-17 LAB — URINALYSIS, ROUTINE W REFLEX MICROSCOPIC
Bilirubin Urine: NEGATIVE
GLUCOSE, UA: NEGATIVE mg/dL
Hgb urine dipstick: NEGATIVE
KETONES UR: NEGATIVE mg/dL
LEUKOCYTES UA: NEGATIVE
NITRITE: NEGATIVE
PH: 6 (ref 5.0–8.0)
Protein, ur: NEGATIVE mg/dL
SPECIFIC GRAVITY, URINE: 1.01 (ref 1.005–1.030)
Urobilinogen, UA: 0.2 mg/dL (ref 0.0–1.0)

## 2015-03-17 LAB — CBC
HCT: 34.3 % — ABNORMAL LOW (ref 36.0–46.0)
HEMOGLOBIN: 12.4 g/dL (ref 12.0–15.0)
MCH: 30.8 pg (ref 26.0–34.0)
MCHC: 36.2 g/dL — ABNORMAL HIGH (ref 30.0–36.0)
MCV: 85.3 fL (ref 78.0–100.0)
Platelets: 226 10*3/uL (ref 150–400)
RBC: 4.02 MIL/uL (ref 3.87–5.11)
RDW: 12.8 % (ref 11.5–15.5)
WBC: 7.4 10*3/uL (ref 4.0–10.5)

## 2015-03-17 LAB — HCG, QUANTITATIVE, PREGNANCY: HCG, BETA CHAIN, QUANT, S: 4370 m[IU]/mL — AB (ref <5)

## 2015-03-17 LAB — POCT PREGNANCY, URINE: Preg Test, Ur: POSITIVE — AB

## 2015-03-17 NOTE — MAU Provider Note (Signed)
History     CSN: [id number]  Arrival date and time: 03/17/15 1813   First Provider Initiated Contact with Patient 03/17/15 1936      Chief Complaint  Patient presents with  . Abdominal Pain   HPI Comments: [name] is a 24 y.o. female G2P1001 at [redacted]w[redacted]d presenting to MAU with abdominal pain. The patient first noticed the abdominal pain 2 weeks ago. The pain is located in the lower part of her stomach; worse on her right side and radiates to her thighs. She currently rates her pain 8/10. She read online that pain could be from an ectopic pregnancy and would like to make sure this is not the case.   She denies vaginal bleeding.   Pelvic Pain The patient's primary symptoms include pelvic pain. This is a new problem. The current episode started 1 to 4 weeks ago. The problem occurs intermittently. The problem has been unchanged. The pain is severe. The problem affects the right side. She is pregnant. Associated symptoms include abdominal pain and nausea. Pertinent negatives include no chills, dysuria, fever or vomiting. There has been no bleeding. Nothing aggravates the symptoms. She has tried nothing for the symptoms.     OB History    Gravida Para Term Preterm AB TAB SAB Ectopic Multiple Living   Past Medical History  Diagnosis Date  . Medical history non-contributory     Past Surgical History  Procedure Laterality Date  . No past surgeries      History reviewed. No pertinent family history.  Social History  Substance Use Topics  . Smoking status: Never Smoker   . Smokeless tobacco: Never Used  . Alcohol Use: No    Allergies: No Known Allergies  No prescriptions prior to admission    Review of Systems  Constitutional: Negative for fever and chills.  Gastrointestinal: Positive for nausea and abdominal pain. Negative for vomiting.  Genitourinary: Positive for pelvic pain. Negative for dysuria.   Physical Exam   Blood pressure 139/83, pulse  89, temperature 98.4 F (36.9 C), temperature source Oral, resp. rate 16, height 5' 1.5" (1.562 m), weight 114 lb (51.71 kg), last menstrual period 02/16/2015.  Physical Exam  Vitals reviewed. Constitutional: She is oriented to person, place, and time. She appears well-developed and well-nourished. No distress.  HENT:  Head: Normocephalic.  Eyes: Pupils are equal, round, and reactive to light.  Cardiovascular: Normal rate.   Respiratory: Effort normal.  GI: There is tenderness in the right lower quadrant and suprapubic area. There is no rigidity, no rebound and no guarding.  Musculoskeletal: Normal range of motion.  Neurological: She is alert and oriented to person, place, and time.  Skin: Skin is warm and dry. She is not diaphoretic.  Psychiatric: Her behavior is normal.    Results for orders placed or performed during the hospital encounter of 03/17/15 (from the past 24 hour(s))  Urinalysis, Routine w reflex microscopic (not at [facility])     Status: None   Collection Time: 03/17/15  6:18 PM  Result Value Ref Range   Color, Urine YELLOW YELLOW   APPearance CLEAR CLEAR   Specific Gravity, Urine 1.010 1.005 - 1.030   pH 6.0 5.0 - 8.0   Glucose, UA NEGATIVE NEGATIVE mg/dL   Hgb urine dipstick NEGATIVE NEGATIVE   Bilirubin Urine NEGATIVE NEGATIVE   Ketones, ur NEGATIVE NEGATIVE mg/dL   Protein, ur NEGATIVE NEGATIVE mg/dL   Urobilinogen,  UA 0.2 0.0 - 1.0 mg/dL   Nitrite NEGATIVE NEGATIVE   Leukocytes, UA NEGATIVE NEGATIVE  Pregnancy, urine POC     Status: Abnormal   Collection Time: 03/17/15  6:25 PM  Result Value Ref Range   Preg Test, Ur POSITIVE (A) NEGATIVE  CBC     Status: Abnormal   Collection Time: 03/17/15  7:55 PM  Result Value Ref Range   WBC 7.4 4.0 - 10.5 K/uL   RBC 4.02 3.87 - 5.11 MIL/uL   Hemoglobin 12.4 12.0 - 15.0 g/dL   HCT 04.534.3 (L) 40.936.0 - 81.146.0 %   MCV 85.3 78.0 - 100.0 fL   MCH 30.8 26.0 - 34.0 pg   MCHC 36.2 (H) 30.0 - 36.0 g/dL   RDW 91.412.8 78.211.5 - 95.615.5 %    Platelets 226 150 - 400 K/uL  ABO/Rh     Status: None (Preliminary result)   Collection Time: 03/17/15  7:55 PM  Result Value Ref Range   ABO/RH(D) B POS   hCG, quantitative, pregnancy     Status: Abnormal   Collection Time: 03/17/15  7:55 PM  Result Value Ref Range   hCG, Beta Chain, Quant, S 4370 (H) <5 mIU/mL    [place] Ob Comp Less 14 Wks  03/17/2015  CLINICAL DATA:  Abdominal pain for 1 week. The HCG pending. Gestational age by last menstrual period equals 4 weeks 1 day EXAM: OBSTETRIC <14 WK US AND TRANSVAGINAL OB US TECHNIQUE: Both transabdominal and transvaginal ultrasound examinations were performed for complete evaluation of the gestation as well as the maternal uterus, adnexal regions, and pelvic cul-de-sac. Transvaginal technique was performed to assess early pregnancy. COMPARISON:  None. FINDINGS: Intrauterine gestational sac: None identified Yolk sac:  Not identified Embryo:  Not identified Maternal uterus/adnexae: Corpus luteal cyst of the RIGHT ovary measuring 2.2 cm. Normal small follicles LEFT ovary. Small free fluid. IMPRESSION: No intrauterine gestational sac, yolk sac, or fetal pole identified. Differential considerations include intrauterine pregnancy too early to be sonographically visualized, missed abortion, or ectopic pregnancy. Followup ultrasound is recommended in 10-14 days for further evaluation. Electronically Signed   By: Genevive BiStewart  Edmunds M.D.   On: 03/17/2015 20:32   [place] Ob Transvaginal  03/17/2015  CLINICAL DATA:  Abdominal pain for 1 week. The HCG pending. Gestational age by last menstrual period equals 4 weeks 1 day EXAM: OBSTETRIC <14 WK US AND TRANSVAGINAL OB US TECHNIQUE: Both transabdominal and transvaginal ultrasound examinations were performed for complete evaluation of the gestation as well as the maternal uterus, adnexal regions, and pelvic cul-de-sac. Transvaginal technique was performed to assess early pregnancy. COMPARISON:  None. FINDINGS: Intrauterine  gestational sac: None identified Yolk sac:  Not identified Embryo:  Not identified Maternal uterus/adnexae: Corpus luteal cyst of the RIGHT ovary measuring 2.2 cm. Normal small follicles LEFT ovary. Small free fluid. IMPRESSION: No intrauterine gestational sac, yolk sac, or fetal pole identified. Differential considerations include intrauterine pregnancy too early to be sonographically visualized, missed abortion, or ectopic pregnancy. Followup ultrasound is recommended in 10-14 days for further evaluation. Electronically Signed   By: Genevive BiStewart  Edmunds M.D.   On: 03/17/2015 20:32    MAU Course  Procedures  None  MDM  CBC ABO Quant US   Report given to Thressa ShellerHeather Hogan CNM who resumes care of the patient. Patient is awaiting US.   Duane LopeJennifer I Rasch, NP  Assessment and Plan   1. Pregnancy of unknown anatomic location   2. Abdominal pain in pregnancy, antepartum    DC  home Comfort measures reviewed  Bleeding precautions Ectopic precautions RX: none    Follow-up Information    Follow up with [facility] [place] [facility] ADMISS[facility].   Why:  03/19/15 around 7:00 PM    Contact information:   [address] [id number] mc [place] [address] [phone]     Tawnya Crook  03/17/2015 9:22 PM

## 2015-03-17 NOTE — MAU Note (Signed)
Patient presents stating she is pregnant (+HPT) with c/o abdominal pain X 1 week. Denies bleeding or discharge.

## 2015-03-17 NOTE — Discharge Instructions (Signed)

## 2015-03-18 LAB — ABO/RH: ABO/RH(D): B POS

## 2015-03-18 LAB — HIV ANTIBODY (ROUTINE TESTING W REFLEX): HIV Screen 4th Generation wRfx: NONREACTIVE

## 2015-03-19 ENCOUNTER — Encounter (HOSPITAL_COMMUNITY): Payer: Self-pay

## 2015-03-19 ENCOUNTER — Inpatient Hospital Stay (HOSPITAL_COMMUNITY)
Admission: AD | Admit: 2015-03-19 | Discharge: 2015-03-19 | Disposition: A | Discharge status: Home / Self Care | Payer: Medicaid Other | Source: Ambulatory Visit | Attending: Obstetrics & Gynecology | Admitting: Obstetrics & Gynecology

## 2015-03-19 ENCOUNTER — Inpatient Hospital Stay (HOSPITAL_COMMUNITY): Payer: Medicaid Other

## 2015-03-19 DIAGNOSIS — O26899 Other specified pregnancy related conditions, unspecified trimester: Secondary | ICD-10-CM

## 2015-03-19 DIAGNOSIS — R109 Unspecified abdominal pain: Secondary | ICD-10-CM

## 2015-03-19 DIAGNOSIS — O26891 Other specified pregnancy related conditions, first trimester: Secondary | ICD-10-CM | POA: Insufficient documentation

## 2015-03-19 DIAGNOSIS — O3680X0 Pregnancy with inconclusive fetal viability, not applicable or unspecified: Secondary | ICD-10-CM

## 2015-03-19 DIAGNOSIS — O3680X1 Pregnancy with inconclusive fetal viability, fetus 1: Secondary | ICD-10-CM

## 2015-03-19 DIAGNOSIS — Z3A01 Less than 8 weeks gestation of pregnancy: Secondary | ICD-10-CM | POA: Insufficient documentation

## 2015-03-19 DIAGNOSIS — R1084 Generalized abdominal pain: Secondary | ICD-10-CM

## 2015-03-19 DIAGNOSIS — R1031 Right lower quadrant pain: Secondary | ICD-10-CM | POA: Insufficient documentation

## 2015-03-19 LAB — HCG, QUANTITATIVE, PREGNANCY: hCG, Beta Chain, Quant, S: 8479 m[IU]/mL — ABNORMAL HIGH (ref <5)

## 2015-03-19 MED ORDER — OXYCODONE-ACETAMINOPHEN 5-325 MG PO TABS: 1.0000 | ORAL_TABLET | ORAL | Status: DC | PRN

## 2015-03-19 NOTE — MAU Note (Signed)
Pt here for F/U BHCG.  Pt continues to have RLQ pain, denies bleeding.

## 2015-03-19 NOTE — Discharge Instructions (Signed)
Ultrasound department will call you on Monday to schedule ultrasound for next week   Abdominal Pain During Pregnancy Abdominal pain is common in pregnancy. Most of the time, it does not cause harm. There are many causes of abdominal pain. Some causes are more serious than others. Some of the causes of abdominal pain in pregnancy are easily diagnosed. Occasionally, the diagnosis takes time to understand. Other times, the cause is not determined. Abdominal pain can be a sign that something is very wrong with the pregnancy, or the pain may have nothing to do with the pregnancy at all. For this reason, always tell your health care provider if you have any abdominal discomfort. HOME CARE INSTRUCTIONS  Monitor your abdominal pain for any changes. The following actions may help to alleviate any discomfort you are experiencing:  Do not have sexual intercourse or put anything in your vagina until your symptoms go away completely.  Get plenty of rest until your pain improves.  Drink clear fluids if you feel nauseous. Avoid solid food as long as you are uncomfortable or nauseous.  Only take over-the-counter or prescription medicine as directed by your health care provider.  Keep all follow-up appointments with your health care provider. SEEK IMMEDIATE MEDICAL CARE IF:  You are bleeding, leaking fluid, or passing tissue from the vagina.  You have increasing pain or cramping.  You have persistent vomiting.  You have painful or bloody urination.  You have a fever.  You notice a decrease in your baby's movements.  You have extreme weakness or feel faint.  You have shortness of breath, with or without abdominal pain.  You develop a severe headache with abdominal pain.  You have abnormal vaginal discharge with abdominal pain.  You have persistent diarrhea.  You have abdominal pain that continues even after rest, or gets worse. MAKE SURE YOU:   Understand these instructions.  Will watch  your condition.  Will get help right away if you are not doing well or get worse.   This information is not intended to replace advice given to you by your health care provider. Make sure you discuss any questions you have with your health care provider.   Document Released: 05/17/2005 Document Revised: 03/07/2013 Document Reviewed: 12/14/2012 Elsevier Interactive Patient Education [employer]2016 Elsevier Inc.

## 2015-03-19 NOTE — MAU Provider Note (Signed)
History   [id number]   Chief Complaint  Patient presents with  . Follow-up    HPI Shirley Baker is a 24 y.o. female G2P1001 here for follow-up BHCG.  Upon review of the records patient was first seen on 10/17 for abdominal pain. BHCG on that day was 4370. Ultrasound showed no intrauterine gestation. GC/CT and wet prep were not collected. Pt discharged home. Pt here today with report of increased abdominal pain and no vaginal bleeding.   All other systems negative.   Patient's last menstrual period was 02/16/2015.  OB History  Gravida Para Term Preterm AB SAB TAB Ectopic Multiple Living  2 1 1       1     # Outcome Date GA Lbr Len/2nd Weight Sex Delivery Anes PTL Lv  2 Current           1 Term      Vag-Spont         Past Medical History  Diagnosis Date  . Medical history non-contributory     No family history on file.  Social History   Social History  . Marital Status: Single    Spouse Name: N/A  . Number of Children: N/A  . Years of Education: N/A   Social History Main Topics  . Smoking status: Never Smoker   . Smokeless tobacco: Never Used  . Alcohol Use: No  . Drug Use: No  . Sexual Activity: Yes    Birth Control/ Protection: None   Other Topics Concern  . Not on file   Social History Narrative    No Known Allergies  No current facility-administered medications on file prior to encounter.   No current outpatient prescriptions on file prior to encounter.     Physical Exam   Filed Vitals:   03/19/15 1753  BP: 122/71  Pulse: 75  Temp: 98.2 F (36.8 C)  TempSrc: Oral  Resp: 18    Physical Exam  Constitutional: She is oriented to person, place, and time. She appears well-developed and well-nourished. No distress.  HENT:  Head: Normocephalic and atraumatic.  Cardiovascular: Normal rate.   Respiratory: Effort normal. No respiratory distress.  GI: Soft. There is no tenderness.  Neurological: She is alert and oriented to person, place, and time.   Skin: She is not diaphoretic.  Psychiatric: She has a normal mood and affect. Her behavior is normal. Judgment and thought content normal.    MAU Course  Procedures Component     Latest Ref Rng 03/17/2015 03/19/2015  HCG, Beta Chain, Quant, S     <5 mIU/mL 4370 (H) 8479 (H)   [place] Ob Transvaginal  03/19/2015  CLINICAL DATA:  Right lower quadrant pain. EXAM: OBSTETRIC <14 WK US AND TRANSVAGINAL OB US TECHNIQUE: Both transabdominal and transvaginal ultrasound examinations were performed for complete evaluation of the gestation as well as the maternal uterus, adnexal regions, and pelvic cul-de-sac. Transvaginal technique was performed to assess early pregnancy. COMPARISON:  03/17/2015. FINDINGS: Intrauterine gestational sac: Present. Yolk sac:  Questionable faint yolk sac. Embryo:  Not visualized. Cardiac Activity: Not visualized. MSD: 6 mm  mm   5 w   2  d    US EDC: 11/17/2015 Maternal uterus/adnexae: Small to moderate subchorionic hemorrhage. Corpus luteum cyst in the right ovary. Left ovary is unremarkable. Small free fluid. IMPRESSION: 1. Single living intrauterine pregnancy with gestational age of [redacted] weeks 2 days. 2. Small to moderate subchorionic hemorrhage. 3. Small free fluid. Electronically Signed   By: Leanna BattlesMelinda  Blietz  M.D.   On: 03/19/2015 19:36     MDM VSS No apparent distress IUGS with "questionable yolk sac" & appropriate rise in BHCG Assessment and Plan  24 y.o. G2P1001 at [redacted]w[redacted]d wks Pregnancy Follow-up BHCG 1. Pregnancy of unknown anatomic location   2. Abdominal pain in pregnancy    P: Discharge home F/u in 1 week for outpatient ultrasound Discussed reasons to return to MAU  Judeth Horn, NP 03/19/2015 5:37 PM

## 2015-03-24 ENCOUNTER — Ambulatory Visit (HOSPITAL_COMMUNITY): Payer: Self-pay

## 2015-03-27 ENCOUNTER — Ambulatory Visit (HOSPITAL_COMMUNITY)
Admission: RE | Admit: 2015-03-27 | Discharge: 2015-03-27 | Disposition: A | Discharge status: Home / Self Care | Payer: 59 | Source: Ambulatory Visit | Attending: Student | Admitting: Student

## 2015-03-27 ENCOUNTER — Inpatient Hospital Stay (HOSPITAL_COMMUNITY)
Admission: AD | Admit: 2015-03-27 | Discharge: 2015-03-27 | Disposition: A | Discharge status: Home / Self Care | Payer: 59 | Source: Ambulatory Visit | Attending: Family Medicine | Admitting: Family Medicine

## 2015-03-27 ENCOUNTER — Encounter (HOSPITAL_COMMUNITY): Payer: Self-pay | Admitting: Advanced Practice Midwife

## 2015-03-27 DIAGNOSIS — O468X1 Other antepartum hemorrhage, first trimester: Secondary | ICD-10-CM

## 2015-03-27 DIAGNOSIS — Z3A01 Less than 8 weeks gestation of pregnancy: Secondary | ICD-10-CM | POA: Insufficient documentation

## 2015-03-27 DIAGNOSIS — R109 Unspecified abdominal pain: Secondary | ICD-10-CM

## 2015-03-27 DIAGNOSIS — O3680X0 Pregnancy with inconclusive fetal viability, not applicable or unspecified: Secondary | ICD-10-CM

## 2015-03-27 DIAGNOSIS — O209 Hemorrhage in early pregnancy, unspecified: Secondary | ICD-10-CM

## 2015-03-27 DIAGNOSIS — O26899 Other specified pregnancy related conditions, unspecified trimester: Secondary | ICD-10-CM

## 2015-03-27 DIAGNOSIS — O208 Other hemorrhage in early pregnancy: Secondary | ICD-10-CM | POA: Insufficient documentation

## 2015-03-27 DIAGNOSIS — O418X10 Other specified disorders of amniotic fluid and membranes, first trimester, not applicable or unspecified: Secondary | ICD-10-CM

## 2015-03-27 MED ORDER — CONCEPT OB 130-92.4-1 MG PO CAPS: 1.0000 | ORAL_CAPSULE | Freq: Every day | ORAL | Status: DC

## 2015-03-27 NOTE — Discharge Instructions (Signed)
Subchorionic Hematoma A subchorionic hematoma is a gathering of blood between the outer wall of the placenta and the inner wall of the womb (uterus). The placenta is the organ that connects the fetus to the wall of the uterus. The placenta performs the feeding, breathing (oxygen to the fetus), and waste removal (excretory work) of the fetus.  Subchorionic hematoma is the most common abnormality found on a result from ultrasonography done during the first trimester or early second trimester of pregnancy. If there has been little or no vaginal bleeding, early small hematomas usually shrink on their own and do not affect your baby or pregnancy. The blood is gradually absorbed over 1-2 weeks. When bleeding starts later in pregnancy or the hematoma is larger or occurs in an older pregnant woman, the outcome may not be as good. Larger hematomas may get bigger, which increases the chances for miscarriage. Subchorionic hematoma also increases the risk of premature detachment of the placenta from the uterus, preterm (premature) labor, and stillbirth. HOME CARE INSTRUCTIONS  Stay on bed rest if your health care provider recommends this. Although bed rest will not prevent more bleeding or prevent a miscarriage, your health care provider may recommend bed rest until you are advised otherwise.  Avoid heavy lifting (more than 10 lb [4.5 kg]), exercise, sexual intercourse, or douching as directed by your health care provider.  Keep track of the number of pads you use each day and how soaked (saturated) they are. Write down this information.  Do not use tampons.  Keep all follow-up appointments as directed by your health care provider. Your health care provider may ask you to have follow-up blood tests or ultrasound tests or both. SEEK IMMEDIATE MEDICAL CARE IF:  You have severe cramps in your stomach, back, abdomen, or pelvis.  You have a fever.  You pass large clots or tissue. Save any tissue for your health  care provider to look at.  Your bleeding increases or you become lightheaded, feel weak, or have fainting episodes.   This information is not intended to replace advice given to you by your health care provider. Make sure you discuss any questions you have with your health care provider.   Document Released: 09/01/2006 Document Revised: 06/07/2014 Document Reviewed: 12/14/2012 Elsevier Interactive Patient Education [employer]  First Trimester of Pregnancy The first trimester of pregnancy is from week 1 until the end of week 12 (months 1 through 3). A week after a sperm fertilizes an egg, the egg will implant on the wall of the uterus. This embryo will begin to develop into a baby. Genes from you and your partner are forming the baby. The female genes determine whether the baby is a boy or a girl. At 6-8 weeks, the eyes and face are formed, and the heartbeat can be seen on ultrasound. At the end of 12 weeks, all the baby's organs are formed.  Now that you are pregnant, you will want to do everything you can to have a healthy baby. Two of the most important things are to get good prenatal care and to follow your health care provider's instructions. Prenatal care is all the medical care you receive before the baby's birth. This care will help prevent, find, and treat any problems during the pregnancy and childbirth. BODY CHANGES Your body goes through many changes during pregnancy. The changes vary from woman to woman.   You may gain or lose a couple of pounds at first.  You may feel sick to  your stomach (nauseous) and throw up (vomit). If the vomiting is uncontrollable, call your health care provider.  You may tire easily.  You may develop headaches that can be relieved by medicines approved by your health care provider.  You may urinate more often. Painful urination may mean you have a bladder infection.  You may develop heartburn as a result of your pregnancy.  You may develop  constipation because certain hormones are causing the muscles that push waste through your intestines to slow down.  You may develop hemorrhoids or swollen, bulging veins (varicose veins).  Your breasts may begin to grow larger and become tender. Your nipples may stick out more, and the tissue that surrounds them (areola) may become darker.  Your gums may bleed and may be sensitive to brushing and flossing.  Dark spots or blotches (chloasma, mask of pregnancy) may develop on your face. This will likely fade after the baby is born.  Your menstrual periods will stop.  You may have a loss of appetite.  You may develop cravings for certain kinds of food.  You may have changes in your emotions from day to day, such as being excited to be pregnant or being concerned that something may go wrong with the pregnancy and baby.  You may have more vivid and strange dreams.  You may have changes in your hair. These can include thickening of your hair, rapid growth, and changes in texture. Some women also have hair loss during or after pregnancy, or hair that feels dry or thin. Your hair will most likely return to normal after your baby is born. WHAT TO EXPECT AT YOUR PRENATAL VISITS During a routine prenatal visit:  You will be weighed to make sure you and the baby are growing normally.  Your blood pressure will be taken.  Your abdomen will be measured to track your baby's growth.  The fetal heartbeat will be listened to starting around week 10 or 12 of your pregnancy.  Test results from any previous visits will be discussed. Your health care provider may ask you:  How you are feeling.  If you are feeling the baby move.  If you have had any abnormal symptoms, such as leaking fluid, bleeding, severe headaches, or abdominal cramping.  If you are using any tobacco products, including cigarettes, chewing tobacco, and electronic cigarettes.  If you have any questions. Other tests that may be  performed during your first trimester include:  Blood tests to find your blood type and to check for the presence of any previous infections. They will also be used to check for low iron levels (anemia) and Rh antibodies. Later in the pregnancy, blood tests for diabetes will be done along with other tests if problems develop.  Urine tests to check for infections, diabetes, or protein in the urine.  An ultrasound to confirm the proper growth and development of the baby.  An amniocentesis to check for possible genetic problems.  Fetal screens for spina bifida and Down syndrome.  You may need other tests to make sure you and the baby are doing well.  HIV (human immunodeficiency virus) testing. Routine prenatal testing includes screening for HIV, unless you choose not to have this test. HOME CARE INSTRUCTIONS  Medicines  Follow your health care provider's instructions regarding medicine use. Specific medicines may be either safe or unsafe to take during pregnancy.  Take your prenatal vitamins as directed.  If you develop constipation, try taking a stool softener if your health care  approves. Diet  Eat regular, well-balanced meals. Choose a variety of foods, such as meat or vegetable-based protein, fish, milk and low-fat dairy products, vegetables, fruits, and whole grain breads and cereals. Your health care provider will help you determine the amount of weight gain that is right for you.  Avoid raw meat and uncooked cheese. These carry germs that can cause birth defects in the baby.  Eating four or five small meals rather than three large meals a day may help relieve nausea and vomiting. If you start to feel nauseous, eating a few soda crackers can be helpful. Drinking liquids between meals instead of during meals also seems to help nausea and vomiting.  If you develop constipation, eat more high-fiber foods, such as fresh vegetables or fruit and whole grains. Drink enough fluids  to keep your urine clear or pale yellow. Activity and Exercise  Exercise only as directed by your health care provider. Exercising will help you:  Control your weight.  Stay in shape.  Be prepared for labor and delivery.  Experiencing pain or cramping in the lower abdomen or low back is a good sign that you should stop exercising. Check with your health care provider before continuing normal exercises.  Try to avoid standing for long periods of time. Move your legs often if you must stand in one place for a long time.  Avoid heavy lifting.  Wear low-heeled shoes, and practice good posture.  You may continue to have sex unless your health care provider directs you otherwise. Relief of Pain or Discomfort  Wear a good support bra for breast tenderness.   Take warm sitz baths to soothe any pain or discomfort caused by hemorrhoids. Use hemorrhoid cream if your health care provider approves.   Rest with your legs elevated if you have leg cramps or low back pain.  If you develop varicose veins in your legs, wear support hose. Elevate your feet for 15 minutes, 3-4 times a day. Limit salt in your diet. Prenatal Care  Schedule your prenatal visits by the twelfth week of pregnancy. They are usually scheduled monthly at first, then more often in the last 2 months before delivery.  Write down your questions. Take them to your prenatal visits.  Keep all your prenatal visits as directed by your health care provider. Safety  Wear your seat belt at all times when driving.  Make a list of emergency phone numbers, including numbers for family, friends, the hospital, and police and fire departments. General Tips  Ask your health care provider for a referral to a local prenatal education class. Begin classes no later than at the beginning of month 6 of your pregnancy.  Ask for help if you have counseling or nutritional needs during pregnancy. Your health care provider can offer advice or  refer you to specialists for help with various needs.  Do not use hot tubs, steam rooms, or saunas.  Do not douche or use tampons or scented sanitary pads.  Do not cross your legs for long periods of time.  Avoid cat litter boxes and soil used by cats. These carry germs that can cause birth defects in the baby and possibly loss of the fetus by miscarriage or stillbirth.  Avoid all smoking, herbs, alcohol, and medicines not prescribed by your health care provider. Chemicals in these affect the formation and growth of the baby.  Do not use any tobacco products, including cigarettes, chewing tobacco, and electronic cigarettes. If you need help quitting, ask your   your health care provider. You may receive counseling support and other resources to help you quit.  Schedule a dentist appointment. At home, brush your teeth with a soft toothbrush and be gentle when you floss. SEEK MEDICAL CARE IF:   You have dizziness.  You have mild pelvic cramps, pelvic pressure, or nagging pain in the abdominal area.  You have persistent nausea, vomiting, or diarrhea.  You have a bad smelling vaginal discharge.  You have pain with urination.  You notice increased swelling in your face, hands, legs, or ankles. SEEK IMMEDIATE MEDICAL CARE IF:   You have a fever.  You are leaking fluid from your vagina.  You have spotting or bleeding from your vagina.  You have severe abdominal cramping or pain.  You have rapid weight gain or loss.  You vomit blood or material that looks like coffee grounds.  You are exposed to [place] measles and have never had them.  You are exposed to fifth disease or chickenpox.  You develop a severe headache.  You have shortness of breath.  You have any kind of trauma, such as from a fall or a car accident.   This information is not intended to replace advice given to you by your health care provider. Make sure you discuss any questions you have with your health care  provider.   Document Released: 05/11/2001 Document Revised: 06/07/2014 Document Reviewed: 03/27/2013 Elsevier Interactive Patient Education [employer]2016 Elsevier Inc.

## 2015-03-27 NOTE — MAU Provider Note (Signed)
History   Chief Complaint:  No chief complaint on file.   Shirley Baker is  24 y.o. G2P1001 Patient's last menstrual period was 02/16/2015.Marland Kitchen. Patient is here for follow up of quantitative HCG and ongoing surveillance of pregnancy status.   She is 3876w4d weeks gestation  by LMP.    Since her last visit, the patient is without new complaint.     ROS Abdomin Pain: Intermittent, mostly at night. Less than previous MAU visit.  Vaginal bleeding: none now.   Passage of clots or tissue: None Dizziness: None  Her previous Quantitative HCG values are:   Results for Shirley Baker, Aven (MRN [id number]) as of 03/27/2015 10:42  Ref. Range 03/17/2015 19:55 03/19/2015 17:42  HCG, Beta Chain, Quant, S Latest Ref Range: <5 mIU/mL 4370 (H) 8479 (H)    Physical Exam   LMP 02/16/2015 Constitutional: Well-nourished female in no apparent distress. No pallor Neuro: Alert and oriented 4 Cardiovascular: Normal rate Respiratory: Normal effort and rate Gynecological Exam: examination not indicated  Labs: No results found for this or any previous visit (from the past 24 hour(s)).  Ultrasound Studies:   [place] Ob Comp Less 14 Wks  03/17/2015  CLINICAL DATA:  Abdominal pain for 1 week. The HCG pending. Gestational age by last menstrual period equals 4 weeks 1 day EXAM: OBSTETRIC <14 WK US AND TRANSVAGINAL OB US TECHNIQUE: Both transabdominal and transvaginal ultrasound examinations were performed for complete evaluation of the gestation as well as the maternal uterus, adnexal regions, and pelvic cul-de-sac. Transvaginal technique was performed to assess early pregnancy. COMPARISON:  None. FINDINGS: Intrauterine gestational sac: None identified Yolk sac:  Not identified Embryo:  Not identified Maternal uterus/adnexae: Corpus luteal cyst of the RIGHT ovary measuring 2.2 cm. Normal small follicles LEFT ovary. Small free fluid. IMPRESSION: No intrauterine gestational sac, yolk sac, or fetal pole identified. Differential  considerations include intrauterine pregnancy too early to be sonographically visualized, missed abortion, or ectopic pregnancy. Followup ultrasound is recommended in 10-14 days for further evaluation. Electronically Signed   By: Genevive BiStewart  Edmunds M.D.   On: 03/17/2015 20:32   [place] Ob Transvaginal  03/27/2015  CLINICAL DATA:  Abdominal pain.  Early pregnancy. EXAM: TRANSVAGINAL OB ULTRASOUND TECHNIQUE: Transvaginal ultrasound was performed for complete evaluation of the gestation as well as the maternal uterus, adnexal regions, and pelvic cul-de-sac. COMPARISON:  03/19/2015 FINDINGS: Intrauterine gestational sac: Visualized/normal in shape. Yolk sac:  Present. Embryo:  Present. Cardiac Activity: Present. Heart Rate: 121 bpm CRL:   3.1  mm   5 w 6 d Maternal uterus/adnexae: Small subchorionic hemorrhage. Trace free fluid in the pelvis. Unremarkable appearance of the ovaries aside from a right ovarian corpus luteum cyst. IMPRESSION: Single living intrauterine gestation as above. Fetal pole and cardiac activity now identified. Electronically Signed   By: Sebastian AcheAllen  Grady M.D.   On: 03/27/2015 10:39   [place] Ob Transvaginal  03/19/2015  CLINICAL DATA:  Right lower quadrant pain. EXAM: OBSTETRIC <14 WK US AND TRANSVAGINAL OB US TECHNIQUE: Both transabdominal and transvaginal ultrasound examinations were performed for complete evaluation of the gestation as well as the maternal uterus, adnexal regions, and pelvic cul-de-sac. Transvaginal technique was performed to assess early pregnancy. COMPARISON:  03/17/2015. FINDINGS: Intrauterine gestational sac: Present. Yolk sac:  Questionable faint yolk sac. Embryo:  Not visualized. Cardiac Activity: Not visualized. MSD: 6 mm  mm   5 w   2  d    US EDC: 11/17/2015 Maternal uterus/adnexae: Small to moderate subchorionic hemorrhage. Corpus luteum cyst in the  right ovary. Left ovary is unremarkable. Small free fluid. IMPRESSION: 1. Single living intrauterine pregnancy with gestational  age of [redacted] weeks 2 days. 2. Small to moderate subchorionic hemorrhage. 3. Small free fluid. Electronically Signed   By: Leanna Battles M.D.   On: 03/19/2015 19:36   US Ob Transvaginal  03/17/2015  CLINICAL DATA:  Abdominal pain for 1 week. The HCG pending. Gestational age by last menstrual period equals 4 weeks 1 day EXAM: OBSTETRIC <14 WK [place] AND TRANSVAGINAL OB US TECHNIQUE: Both transabdominal and transvaginal ultrasound examinations were performed for complete evaluation of the gestation as well as the maternal uterus, adnexal regions, and pelvic cul-de-sac. Transvaginal technique was performed to assess early pregnancy. COMPARISON:  None. FINDINGS: Intrauterine gestational sac: None identified Yolk sac:  Not identified Embryo:  Not identified Maternal uterus/adnexae: Corpus luteal cyst of the RIGHT ovary measuring 2.2 cm. Normal small follicles LEFT ovary. Small free fluid. IMPRESSION: No intrauterine gestational sac, yolk sac, or fetal pole identified. Differential considerations include intrauterine pregnancy too early to be sonographically visualized, missed abortion, or ectopic pregnancy. Followup ultrasound is recommended in 10-14 days for further evaluation. Electronically Signed   By: Genevive Bi M.D.   On: 03/17/2015 20:32    MAU course/MDM: Ultrasound  Bleeding in early pregnancy with confirmed live IUP and hemodynamically stable.  Assessment: 1. Subchorionic hemorrhage in first trimester   2. Vaginal bleeding in pregnancy, first trimester    Plan: Discharge home in stable condition. Bleeding and first trimester precautions PNV Rx  Follow-up Information    Follow up with Obstetrician of your choice.   Why:  Start prenatal care      Follow up with [facility] [place] [facility].   Why:  As needed in emergencies   Contact information:   [address] [id number] mc [place] [id number] [phone]        Medication List     TAKE these medications        CONCEPT OB 130-92.4-1 MG Caps  Take 1 tablet by mouth daily.     oxyCODONE-acetaminophen 5-325 MG tablet  Commonly known as:  PERCOCET/ROXICET  Take 1-2 tablets by mouth every 4 (four) hours as needed for severe pain.       Dorathy Kinsman, CNM 03/27/2015, 10:53 AM  2/3

## 2015-05-05 LAB — CYSTIC FIBROSIS MUTATION 97: Cystic Fibrosis Mutat: NEGATIVE

## 2015-05-06 LAB — HEMOGLOBINOPATHY EVALUATION

## 2015-05-13 ENCOUNTER — Other Ambulatory Visit (HOSPITAL_COMMUNITY): Payer: Self-pay | Admitting: Nurse Practitioner

## 2015-05-13 DIAGNOSIS — Z3A13 13 weeks gestation of pregnancy: Secondary | ICD-10-CM

## 2015-05-13 DIAGNOSIS — Z3682 Encounter for antenatal screening for nuchal translucency: Secondary | ICD-10-CM

## 2015-05-16 ENCOUNTER — Ambulatory Visit (HOSPITAL_COMMUNITY)
Admission: RE | Admit: 2015-05-16 | Discharge: 2015-05-16 | Disposition: A | Discharge status: Home / Self Care | Payer: Medicaid Other | Source: Ambulatory Visit | Attending: Nurse Practitioner | Admitting: Nurse Practitioner

## 2015-05-16 ENCOUNTER — Other Ambulatory Visit (HOSPITAL_COMMUNITY): Payer: Self-pay | Admitting: Nurse Practitioner

## 2015-05-16 ENCOUNTER — Ambulatory Visit (HOSPITAL_COMMUNITY)
Admission: RE | Admit: 2015-05-16 | Discharge: 2015-05-16 | Disposition: A | Discharge status: Home / Self Care | Payer: Medicaid Other | Source: Ambulatory Visit | Attending: Emergency Medicine | Admitting: Emergency Medicine

## 2015-05-16 ENCOUNTER — Encounter (HOSPITAL_COMMUNITY): Payer: Self-pay

## 2015-05-16 ENCOUNTER — Other Ambulatory Visit: Payer: Self-pay | Admitting: Nurse Practitioner

## 2015-05-16 DIAGNOSIS — Z36 Encounter for antenatal screening of mother: Secondary | ICD-10-CM | POA: Diagnosis present

## 2015-05-16 DIAGNOSIS — Z3682 Encounter for antenatal screening for nuchal translucency: Secondary | ICD-10-CM

## 2015-05-16 DIAGNOSIS — Z3A13 13 weeks gestation of pregnancy: Secondary | ICD-10-CM

## 2015-05-16 DIAGNOSIS — Z3A12 12 weeks gestation of pregnancy: Secondary | ICD-10-CM | POA: Insufficient documentation

## 2015-06-01 NOTE — L&D Delivery Note (Signed)
Delivery Note At 1:26 AM a viable and healthy female was delivered after pushing for one contraction via Vaginal, Spontaneous Delivery (Presentation: Left Occiput Anterior).  Infant vigorous, placed skin-to-skin w/ mom. APGAR: 8, 9; weight pending .   Placenta status: Intact, Spontaneous. Small, w/ many calcifications, small, short cord.  Pathology.  Cord: 3 vessels with the following complications: None.  Cord pH: NA  Anesthesia: None  Episiotomy: None Lacerations: left Labial lac, hemostatic, no repair.  Suture Repair: NA Est. Blood Loss (mL): 100  Mom to postpartum.  Baby to Couplet care / Skin to Skin. Placenta to: Pathology Feeding: Breast Circ: NA Contraception: Condoms  Shirley Baker 11/10/2015, 2:19 AM

## 2015-09-29 NOTE — Progress Notes (Signed)
Cardiology Office Note   Date:  09/30/2015   ID:  Shirley Baker, DOB Oct 03, 1990, MRN [id number]  PCP:  Lucilla EdinAUB, STEVE A, MD  Cardiologist:   Regan LemmingWill Martin Dae Antonucci, MD    Chief Complaint  Patient presents with  . New Patient (Initial Visit)  . Loss of Consciousness  . Heart Murmur     History of Present Illness: Shirley BearMadina Buege is a 25 y.o. female who presents today for cardiology evaluation.   She presents for evaluation of a murmur as well as syncope. She says that she was diagnosed with a murmur by one of her OB physicians. She was actually sitting at the physician's office when she passed out. She says that when she passes out, she gets hot and sweaty. Most recently C was sitting in a chair when this happened. It is happened 2 other times. One time she was standing in the kitchen cooking. It is not associated with changing positions. She always has a prodrome of feeling hot and sweaty prior to passing out. When she wakes up she feels well. She has had palpitations in the past that are associated with her passing out.   Today, she denies symptoms of palpitations, chest pain, shortness of breath, orthopnea, PND, lower extremity edema, claudication, dizziness, presyncope, syncope, bleeding, or neurologic sequela. The patient is tolerating medications without difficulties and is otherwise without complaint today.    Past Medical History  Diagnosis Date  . Medical history non-contributory   . Syncope   . Heart murmur    Past Surgical History  Procedure Laterality Date  . No past surgeries       Current Outpatient Prescriptions  Medication Sig Dispense Refill  . BIOTIN PO Take 1 tablet by mouth daily.    Burnis Medin. Prenat w/o A Vit-FeFum-FePo-FA (CONCEPT OB) 130-92.4-1 MG CAPS Take 1 tablet by mouth daily. 30 capsule 12   No current facility-administered medications for this visit.    Allergies:   Review of patient's allergies indicates no known allergies.   Social History:  The patient   reports that she has never smoked. She has never used smokeless tobacco. She reports that she does not drink alcohol or use illicit drugs.   Family History:  The patient's History on this patient family history is not on file.    ROS:  Please see the history of present illness.   Otherwise, review of systems is positive for chest pain, shortness of breath when laying down, dizziness, syncope.   All other systems are reviewed and negative.    PHYSICAL EXAM: VS:  BP 110/70 mmHg  Pulse 81  Ht 5\' 2"  (1.575 m)  Wt 134 lb (60.782 kg)  BMI 24.50 kg/m2  LMP 02/16/2015 , BMI Body mass index is 24.5 kg/(m^2). GEN: Well nourished, well developed, in no acute distress HEENT: normal Neck: no JVD, carotid bruits, or masses Cardiac: RRR; 1/6 murmur at the base, no rubs, or gallops,no edema  Respiratory:  clear to auscultation bilaterally, normal work of breathing GI: soft, nontender, nondistended, + BS MS: no deformity or atrophy Skin: warm and dry Neuro:  Strength and sensation are intact Psych: euthymic mood, full affect  EKG:  EKG is ordered today. The ekg ordered today shows sinus rhythm, rate 81  Recent Labs: 11/20/2014: TSH 0.752 03/17/2015: Hemoglobin 12.4; Platelets 226    Lipid Panel  No results found for: CHOL, TRIG, HDL, CHOLHDL, VLDL, LDLCALC, LDLDIRECT   Wt Readings from Last 3 Encounters:  09/30/15 134 lb (60.782  kg)  05/16/15 118 lb 9.6 oz (53.797 kg)  03/17/15 114 lb (51.71 kg)      Other studies Reviewed: Additional studies/ records that were reviewed today include: OB notes  ASSESSMENT AND PLAN:  1.  Syncope: She has been having syncope that does not sound orthostatic in nature. She has had palpitations that were associated with her syncope. Due to that, we'll fit her with a 30 day monitor to see if we can diagnose any SVT that could be a cause of her issues. She has a month and a half left for her pregnancy, and this may improve after she delivers. I have told her  of driving restrictions by [facility]. She is not to drive for 6 months since the most recent episode of syncope.  2. Murmur: She has a soft murmur at the base of her heart. She does not have any shortness of breath or symptoms of heart failure. We'll continue to monitor.    Current medicines are reviewed at length with the patient today.   The patient does not have concerns regarding her medicines.  The following changes were made today:  none  Labs/ tests ordered today include:  Orders Placed This Encounter  Procedures  . Cardiac event monitor  . EKG 12-Lead     Disposition:   FU with Mindy Gali post monitor  Signed, Bettey Muraoka Jorja Loa, MD  09/30/2015 12:21 PM     [facility] [address] [place] [address] [phone] (office) [phone] (fax)

## 2015-09-30 ENCOUNTER — Encounter: Payer: Self-pay | Admitting: Cardiology

## 2015-09-30 ENCOUNTER — Ambulatory Visit (INDEPENDENT_AMBULATORY_CARE_PROVIDER_SITE_OTHER): Payer: Medicaid Other | Admitting: Cardiology

## 2015-09-30 VITALS — BP 110/70 | HR 81 | Ht 62.0 in | Wt 134.0 lb

## 2015-09-30 DIAGNOSIS — R002 Palpitations: Secondary | ICD-10-CM | POA: Diagnosis not present

## 2015-09-30 DIAGNOSIS — R55 Syncope and collapse: Secondary | ICD-10-CM | POA: Diagnosis not present

## 2015-09-30 NOTE — Patient Instructions (Signed)
Medication Instructions:  Your physician recommends that you continue on your current medications as directed. Please refer to the Current Medication list given to you today.  Labwork: None ordered  Testing/Procedures: Your physician has recommended that you wear an event monitor. Event monitors are medical devices that record the heart's electrical activity. Doctors most often us these monitors to diagnose arrhythmias. Arrhythmias are problems with the speed or rhythm of the heartbeat. The monitor is a small, portable device. You can wear one while you do your normal daily activities. This is usually used to diagnose what is causing palpitations/syncope (passing out).  Follow-Up: To be determined once monitor results have reviewed by Dr. Elberta Fortisamnitz.  We will call you with the results.  If you need a refill on your cardiac medications before your next appointment, please call your pharmacy.  Thank you for choosing [facility]!!   Dory HornSherri Rece Zechman, RN [phone]

## 2015-10-24 ENCOUNTER — Inpatient Hospital Stay (HOSPITAL_COMMUNITY)
Admission: AD | Admit: 2015-10-24 | Discharge: 2015-10-24 | Disposition: A | Discharge status: Home / Self Care | Payer: Medicaid Other | Source: Ambulatory Visit | Attending: Family Medicine | Admitting: Family Medicine

## 2015-10-24 ENCOUNTER — Encounter (HOSPITAL_COMMUNITY): Payer: Self-pay | Admitting: *Deleted

## 2015-10-24 ENCOUNTER — Inpatient Hospital Stay (HOSPITAL_COMMUNITY): Payer: Medicaid Other

## 2015-10-24 DIAGNOSIS — R102 Pelvic and perineal pain: Secondary | ICD-10-CM | POA: Diagnosis not present

## 2015-10-24 DIAGNOSIS — Z3A35 35 weeks gestation of pregnancy: Secondary | ICD-10-CM

## 2015-10-24 DIAGNOSIS — Z3A36 36 weeks gestation of pregnancy: Secondary | ICD-10-CM | POA: Insufficient documentation

## 2015-10-24 DIAGNOSIS — O288 Other abnormal findings on antenatal screening of mother: Secondary | ICD-10-CM

## 2015-10-24 DIAGNOSIS — O26893 Other specified pregnancy related conditions, third trimester: Secondary | ICD-10-CM | POA: Diagnosis not present

## 2015-10-24 LAB — URINALYSIS, ROUTINE W REFLEX MICROSCOPIC
Bilirubin Urine: NEGATIVE
GLUCOSE, UA: NEGATIVE mg/dL
Hgb urine dipstick: NEGATIVE
Ketones, ur: NEGATIVE mg/dL
Nitrite: NEGATIVE
PROTEIN: NEGATIVE mg/dL
pH: 6 (ref 5.0–8.0)

## 2015-10-24 LAB — URINE MICROSCOPIC-ADD ON

## 2015-10-24 NOTE — Discharge Instructions (Signed)
Abdominal Pain During Pregnancy Belly (abdominal) pain is common during pregnancy. Most of the time, it is not a serious problem. Other times, it can be a sign that something is wrong with the pregnancy. Always tell your doctor if you have belly pain. HOME CARE Monitor your belly pain for any changes. The following actions may help you feel better:  Do not have sex (intercourse) or put anything in your vagina until you feel better.  Rest until your pain stops.  Drink clear fluids if you feel sick to your stomach (nauseous). Do not eat solid food until you feel better.  Only take medicine as told by your doctor.  Keep all doctor visits as told. GET HELP RIGHT AWAY IF:  1. You are bleeding, leaking fluid, or pieces of tissue come out of your vagina. 2. You have more pain or cramping. 3. You keep throwing up (vomiting). 4. You have pain when you pee (urinate) or have blood in your pee. 5. You have a fever. 6. You do not feel your baby moving as much. 7. You feel very weak or feel like passing out. 8. You have trouble breathing, with or without belly pain. 9. You have a very bad headache and belly pain. 10. You have fluid leaking from your vagina and belly pain. 11. You keep having watery poop (diarrhea). 12. Your belly pain does not go away after resting, or the pain gets worse. MAKE SURE YOU:   Understand these instructions.  Will watch your condition.  Will get help right away if you are not doing well or get worse.   This information is not intended to replace advice given to you by your health care provider. Make sure you discuss any questions you have with your health care provider.   Document Released: 05/05/2009 Document Revised: 01/17/2013 Document Reviewed: 12/14/2012 Elsevier Interactive Patient Education 2016 [employer] Fetal Movement Counts Patient Name: __________________________________________________ Patient Due Date: ____________________ Performing a fetal  movement count is highly recommended in high-risk pregnancies, but it is good for every pregnant woman to do. Your health care provider may ask you to start counting fetal movements at 28 weeks of the pregnancy. Fetal movements often increase:  After eating a full meal.  After physical activity.  After eating or drinking something sweet or cold.  At rest. Pay attention to when you feel the baby is most active. This will help you notice a pattern of your baby's sleep and wake cycles and what factors contribute to an increase in fetal movement. It is important to perform a fetal movement count at the same time each day when your baby is normally most active.  HOW TO COUNT FETAL MOVEMENTS 13. Find a quiet and comfortable area to sit or lie down on your left side. Lying on your left side provides the best blood and oxygen circulation to your baby. 14. Write down the day and time on a sheet of paper or in a journal. 15. Start counting kicks, flutters, swishes, rolls, or jabs in a 2-hour period. You should feel at least 10 movements within 2 hours. 16. If you do not feel 10 movements in 2 hours, wait 2-3 hours and count again. Look for a change in the pattern or not enough counts in 2 hours. SEEK MEDICAL CARE IF:  You feel less than 10 counts in 2 hours, tried twice.  There is no movement in over an hour.  The pattern is changing or taking longer each day to reach  10 counts in 2 hours.  You feel the baby is not moving as he or she usually does. Date: ____________ Movements: ____________ Start time: ____________ Doreatha Martin time: ____________  Date: ____________ Movements: ____________ Start time: ____________ Doreatha Martin time: ____________ Date: ____________ Movements: ____________ Start time: ____________ Doreatha Martin time: ____________ Date: ____________ Movements: ____________ Start time: ____________ Doreatha Martin time: ____________ Date: ____________ Movements: ____________ Start time: ____________ Doreatha Martin time:  ____________ Date: ____________ Movements: ____________ Start time: ____________ Doreatha Martin time: ____________ Date: ____________ Movements: ____________ Start time: ____________ Doreatha Martin time: ____________ Date: ____________ Movements: ____________ Start time: ____________ Doreatha Martin time: ____________  Date: ____________ Movements: ____________ Start time: ____________ Doreatha Martin time: ____________ Date: ____________ Movements: ____________ Start time: ____________ Doreatha Martin time: ____________ Date: ____________ Movements: ____________ Start time: ____________ Doreatha Martin time: ____________ Date: ____________ Movements: ____________ Start time: ____________ Doreatha Martin time: ____________ Date: ____________ Movements: ____________ Start time: ____________ Doreatha Martin time: ____________ Date: ____________ Movements: ____________ Start time: ____________ Doreatha Martin time: ____________ Date: ____________ Movements: ____________ Start time: ____________ Doreatha Martin time: ____________  Date: ____________ Movements: ____________ Start time: ____________ Doreatha Martin time: ____________ Date: ____________ Movements: ____________ Start time: ____________ Doreatha Martin time: ____________ Date: ____________ Movements: ____________ Start time: ____________ Doreatha Martin time: ____________ Date: ____________ Movements: ____________ Start time: ____________ Doreatha Martin time: ____________ Date: ____________ Movements: ____________ Start time: ____________ Doreatha Martin time: ____________ Date: ____________ Movements: ____________ Start time: ____________ Doreatha Martin time: ____________ Date: ____________ Movements: ____________ Start time: ____________ Doreatha Martin time: ____________  Date: ____________ Movements: ____________ Start time: ____________ Doreatha Martin time: ____________ Date: ____________ Movements: ____________ Start time: ____________ Doreatha Martin time: ____________ Date: ____________ Movements: ____________ Start time: ____________ Doreatha Martin time: ____________ Date: ____________ Movements:  ____________ Start time: ____________ Doreatha Martin time: ____________ Date: ____________ Movements: ____________ Start time: ____________ Doreatha Martin time: ____________ Date: ____________ Movements: ____________ Start time: ____________ Doreatha Martin time: ____________ Date: ____________ Movements: ____________ Start time: ____________ Doreatha Martin time: ____________  Date: ____________ Movements: ____________ Start time: ____________ Doreatha Martin time: ____________ Date: ____________ Movements: ____________ Start time: ____________ Doreatha Martin time: ____________ Date: ____________ Movements: ____________ Start time: ____________ Doreatha Martin time: ____________ Date: ____________ Movements: ____________ Start time: ____________ Doreatha Martin time: ____________ Date: ____________ Movements: ____________ Start time: ____________ Doreatha Martin time: ____________ Date: ____________ Movements: ____________ Start time: ____________ Doreatha Martin time: ____________ Date: ____________ Movements: ____________ Start time: ____________ Doreatha Martin time: ____________  Date: ____________ Movements: ____________ Start time: ____________ Doreatha Martin time: ____________ Date: ____________ Movements: ____________ Start time: ____________ Doreatha Martin time: ____________ Date: ____________ Movements: ____________ Start time: ____________ Doreatha Martin time: ____________ Date: ____________ Movements: ____________ Start time: ____________ Doreatha Martin time: ____________ Date: ____________ Movements: ____________ Start time: ____________ Doreatha Martin time: ____________ Date: ____________ Movements: ____________ Start time: ____________ Doreatha Martin time: ____________ Date: ____________ Movements: ____________ Start time: ____________ Doreatha Martin time: ____________  Date: ____________ Movements: ____________ Start time: ____________ Doreatha Martin time: ____________ Date: ____________ Movements: ____________ Start time: ____________ Doreatha Martin time: ____________ Date: ____________ Movements: ____________ Start time: ____________ Doreatha Martin  time: ____________ Date: ____________ Movements: ____________ Start time: ____________ Doreatha Martin time: ____________ Date: ____________ Movements: ____________ Start time: ____________ Doreatha Martin time: ____________ Date: ____________ Movements: ____________ Start time: ____________ Doreatha Martin time: ____________ Date: ____________ Movements: ____________ Start time: ____________ Doreatha Martin time: ____________  Date: ____________ Movements: ____________ Start time: ____________ Doreatha Martin time: ____________ Date: ____________ Movements: ____________ Start time: ____________ Doreatha Martin time: ____________ Date: ____________ Movements: ____________ Start time: ____________ Doreatha Martin time: ____________ Date: ____________ Movements: ____________ Start time: ____________ Doreatha Martin time: ____________ Date: ____________ Movements: ____________ Start time: ____________ Doreatha Martin time: ____________ Date: ____________ Movements: ____________ Start time: ____________ Doreatha Martin time: ____________   This information is not intended to replace advice  given to you by your health care provider. Make sure you discuss any questions you have with your health care provider.   Document Released: 06/16/2006 Document Revised: 06/07/2014 Document Reviewed: 03/13/2012 Elsevier Interactive Patient Education [employer]

## 2015-10-24 NOTE — MAU Note (Signed)
Pt states she is having pelvic pain and pressure and rates it 8/10. Pt denies ctxs, LOF or vaginal bleeding. Fetus active.

## 2015-10-24 NOTE — MAU Provider Note (Signed)
  History     CSN: [id number]  Arrival date and time: 10/24/15 1813   First Provider Initiated Contact with Patient 10/24/15 1924       Chief Complaint  Patient presents with  . Pelvic Pain   HPI Shirley Baker is a 25 y.o. G2P1001 at 7632w0d who presents with pelvic pain & vaginal pressure. Symptoms began this morning. Pain is constant & worse with walking/movement. Rates pain 8/10. Has not treated. Denies contractions, vaginal bleeding, or LOF. Positive fetal movement. Denies complications with pregnancy.  OB History    Gravida Para Term Preterm AB TAB SAB Ectopic Multiple Living   2 1 1       1       Past Medical History  Diagnosis Date  . Medical history non-contributory   . Syncope   . Heart murmur     Past Surgical History  Procedure Laterality Date  . No past surgeries      No family history on file.  Social History  Substance Use Topics  . Smoking status: Never Smoker   . Smokeless tobacco: Never Used  . Alcohol Use: No    Allergies: No Known Allergies  Prescriptions prior to admission  Medication Sig Dispense Refill Last Dose  . Prenat w/o A Vit-FeFum-FePo-FA (CONCEPT OB) 130-92.4-1 MG CAPS Take 1 tablet by mouth daily. 30 capsule 12 10/24/2015 at Unknown time    Review of Systems  Constitutional: Negative.   Gastrointestinal: Positive for abdominal pain. Negative for nausea, vomiting, diarrhea and constipation.  Genitourinary: Negative.    Physical Exam   Blood pressure 128/77, pulse 77, temperature 97.8 F (36.6 C), temperature source Oral, resp. rate 20, height 5\' 2"  (1.575 m), weight 141 lb (63.957 kg), last menstrual period 02/16/2015.  Physical Exam  Nursing note and vitals reviewed. Constitutional: She is oriented to person, place, and time. She appears well-developed and well-nourished. No distress.  HENT:  Head: Normocephalic and atraumatic.  Eyes: Conjunctivae are normal. Right eye exhibits no discharge. Left eye exhibits no discharge. No  scleral icterus.  Neck: Normal range of motion.  Cardiovascular: Normal rate, regular rhythm and normal heart sounds.   No murmur heard. Respiratory: Effort normal and breath sounds normal. No respiratory distress. She has no wheezes.  GI: Soft. There is no tenderness.  Neurological: She is alert and oriented to person, place, and time.  Skin: Skin is warm and dry. She is not diaphoretic.  Psychiatric: She has a normal mood and affect. Her behavior is normal. Judgment and thought content normal.   Dilation: Fingertip Effacement (%): Thick Cervical Position: Posterior Station: -3 Exam by:: Judeth HornErin Winner Valeriano NP  Fetal Tracing:  Baseline: 135 Variability: moderate Accelerations: 10x10 Decelerations: none  Toco: irregular UI   MAU Course  Procedures  MDM Reassuring tracing; not reactive BPP 8/8 with normal AFI  Assessment and Plan  A: 1. Pelvic pain affecting pregnancy in third trimester, antepartum   2. NST (non-stress test) nonreactive   3. [redacted] weeks gestation of pregnancy     P: Discharge home Maternity support belt Preterm labor precautions & fetal kick counts Keep f/u with ob  Judeth Hornrin Jadian Karman 10/24/2015, 7:24 PM

## 2015-11-09 ENCOUNTER — Encounter (HOSPITAL_COMMUNITY): Payer: Self-pay | Admitting: *Deleted

## 2015-11-09 ENCOUNTER — Inpatient Hospital Stay (HOSPITAL_COMMUNITY): Payer: Medicaid Other

## 2015-11-09 ENCOUNTER — Inpatient Hospital Stay (HOSPITAL_COMMUNITY)
Admission: AD | Admit: 2015-11-09 | Discharge: 2015-11-09 | Disposition: A | Discharge status: Home / Self Care | Payer: Medicaid Other | Source: Ambulatory Visit | Attending: Obstetrics and Gynecology | Admitting: Obstetrics and Gynecology

## 2015-11-09 DIAGNOSIS — O288 Other abnormal findings on antenatal screening of mother: Secondary | ICD-10-CM

## 2015-11-09 NOTE — MAU Note (Signed)
Notified Zerita BoersDarlene Lawson CNM BPP 8/8, CNM aware NST not reactive, received order to discharge patient.

## 2015-11-09 NOTE — Discharge Instructions (Signed)
Fetal Movement Counts °Patient Name: __________________________________________________ Patient Due Date: ____________________ °Performing a fetal movement count is highly recommended in high-risk pregnancies, but it is good for every pregnant woman to do. Your health care provider may ask you to start counting fetal movements at 28 weeks of the pregnancy. Fetal movements often increase: °· After eating a full meal. °· After physical activity. °· After eating or drinking something sweet or cold. °· At rest. °Pay attention to when you feel the baby is most active. This will help you notice a pattern of your baby's sleep and wake cycles and what factors contribute to an increase in fetal movement. It is important to perform a fetal movement count at the same time each day when your baby is normally most active.  °HOW TO COUNT FETAL MOVEMENTS °1. Find a quiet and comfortable area to sit or lie down on your left side. Lying on your left side provides the best blood and oxygen circulation to your baby. °2. Write down the day and time on a sheet of paper or in a journal. °3. Start counting kicks, flutters, swishes, rolls, or jabs in a 2-hour period. You should feel at least 10 movements within 2 hours. °4. If you do not feel 10 movements in 2 hours, wait 2-3 hours and count again. Look for a change in the pattern or not enough counts in 2 hours. °SEEK MEDICAL CARE IF: °· You feel less than 10 counts in 2 hours, tried twice. °· There is no movement in over an hour. °· The pattern is changing or taking longer each day to reach 10 counts in 2 hours. °· You feel the baby is not moving as he or she usually does. °Date: ____________ Movements: ____________ Start time: ____________ Finish time: ____________  °Date: ____________ Movements: ____________ Start time: ____________ Finish time: ____________ °Date: ____________ Movements: ____________ Start time: ____________ Finish time: ____________ °Date: ____________ Movements:  ____________ Start time: ____________ Finish time: ____________ °Date: ____________ Movements: ____________ Start time: ____________ Finish time: ____________ °Date: ____________ Movements: ____________ Start time: ____________ Finish time: ____________ °Date: ____________ Movements: ____________ Start time: ____________ Finish time: ____________ °Date: ____________ Movements: ____________ Start time: ____________ Finish time: ____________  °Date: ____________ Movements: ____________ Start time: ____________ Finish time: ____________ °Date: ____________ Movements: ____________ Start time: ____________ Finish time: ____________ °Date: ____________ Movements: ____________ Start time: ____________ Finish time: ____________ °Date: ____________ Movements: ____________ Start time: ____________ Finish time: ____________ °Date: ____________ Movements: ____________ Start time: ____________ Finish time: ____________ °Date: ____________ Movements: ____________ Start time: ____________ Finish time: ____________ °Date: ____________ Movements: ____________ Start time: ____________ Finish time: ____________  °Date: ____________ Movements: ____________ Start time: ____________ Finish time: ____________ °Date: ____________ Movements: ____________ Start time: ____________ Finish time: ____________ °Date: ____________ Movements: ____________ Start time: ____________ Finish time: ____________ °Date: ____________ Movements: ____________ Start time: ____________ Finish time: ____________ °Date: ____________ Movements: ____________ Start time: ____________ Finish time: ____________ °Date: ____________ Movements: ____________ Start time: ____________ Finish time: ____________ °Date: ____________ Movements: ____________ Start time: ____________ Finish time: ____________  °Date: ____________ Movements: ____________ Start time: ____________ Finish time: ____________ °Date: ____________ Movements: ____________ Start time: ____________ Finish  time: ____________ °Date: ____________ Movements: ____________ Start time: ____________ Finish time: ____________ °Date: ____________ Movements: ____________ Start time: ____________ Finish time: ____________ °Date: ____________ Movements: ____________ Start time: ____________ Finish time: ____________ °Date: ____________ Movements: ____________ Start time: ____________ Finish time: ____________ °Date: ____________ Movements: ____________ Start time: ____________ Finish time: ____________  °Date: ____________ Movements: ____________ Start time: ____________ Finish   time: ____________ °Date: ____________ Movements: ____________ Start time: ____________ Finish time: ____________ °Date: ____________ Movements: ____________ Start time: ____________ Finish time: ____________ °Date: ____________ Movements: ____________ Start time: ____________ Finish time: ____________ °Date: ____________ Movements: ____________ Start time: ____________ Finish time: ____________ °Date: ____________ Movements: ____________ Start time: ____________ Finish time: ____________ °Date: ____________ Movements: ____________ Start time: ____________ Finish time: ____________  °Date: ____________ Movements: ____________ Start time: ____________ Finish time: ____________ °Date: ____________ Movements: ____________ Start time: ____________ Finish time: ____________ °Date: ____________ Movements: ____________ Start time: ____________ Finish time: ____________ °Date: ____________ Movements: ____________ Start time: ____________ Finish time: ____________ °Date: ____________ Movements: ____________ Start time: ____________ Finish time: ____________ °Date: ____________ Movements: ____________ Start time: ____________ Finish time: ____________ °Date: ____________ Movements: ____________ Start time: ____________ Finish time: ____________  °Date: ____________ Movements: ____________ Start time: ____________ Finish time: ____________ °Date: ____________  Movements: ____________ Start time: ____________ Finish time: ____________ °Date: ____________ Movements: ____________ Start time: ____________ Finish time: ____________ °Date: ____________ Movements: ____________ Start time: ____________ Finish time: ____________ °Date: ____________ Movements: ____________ Start time: ____________ Finish time: ____________ °Date: ____________ Movements: ____________ Start time: ____________ Finish time: ____________ °Date: ____________ Movements: ____________ Start time: ____________ Finish time: ____________  °Date: ____________ Movements: ____________ Start time: ____________ Finish time: ____________ °Date: ____________ Movements: ____________ Start time: ____________ Finish time: ____________ °Date: ____________ Movements: ____________ Start time: ____________ Finish time: ____________ °Date: ____________ Movements: ____________ Start time: ____________ Finish time: ____________ °Date: ____________ Movements: ____________ Start time: ____________ Finish time: ____________ °Date: ____________ Movements: ____________ Start time: ____________ Finish time: ____________ °  °This information is not intended to replace advice given to you by your health care provider. Make sure you discuss any questions you have with your health care provider. °  °Document Released: 06/16/2006 Document Revised: 06/07/2014 Document Reviewed: 03/13/2012 °Elsevier Interactive Patient Education ©2016 [employer] °Vaginal Delivery °During delivery, your health care provider will help you give birth to your baby. During a vaginal delivery, you will work to push the baby out of your vagina. However, before you can push your baby out, a few things need to happen. The opening of your uterus (cervix) has to soften, thin out, and open up (dilate) all the way to 10 cm. Also, your baby has to move down from the uterus into your vagina.  °SIGNS OF LABOR  °Your health care provider will first need to make sure you  are in labor. Signs of labor include:  °· Passing what is called the mucous plug before labor begins. This is a small amount of blood-stained mucus. °· Having regular, painful uterine contractions.   °· The time between contractions gets shorter.   °· The discomfort and pain gradually get more intense. °· Contraction pains get worse when walking and do not go away when resting.   °· Your cervix becomes thinner (effacement) and dilates. °BEFORE THE DELIVERY °Once you are in labor and admitted into the hospital or care center, your health care provider may do the following:  °5. Perform a complete physical exam. °6. Review any complications related to pregnancy or labor.  °7. Check your blood pressure, pulse, temperature, and heart rate (vital signs).   °8. Determine if, and when, the rupture of amniotic membranes occurred. °9. Do a vaginal exam (using a sterile glove and lubricant) to determine:   °1. The position (presentation) of the baby. Is the baby's head presenting first (vertex) in the birth canal (vagina), or are the feet or buttocks first (breech)?   °2. The level (station) of the baby's head within   the birth canal.   °3. The effacement and dilatation of the cervix.   °10. An electronic fetal monitor is usually placed on your abdomen when you first arrive. This is used to monitor your contractions and the baby's heart rate. °1. When the monitor is on your abdomen (external fetal monitor), it can only pick up the frequency and length of your contractions. It cannot tell the strength of your contractions. °2. If it becomes necessary for your health care provider to know exactly how strong your contractions are or to see exactly what the baby's heart rate is doing, an internal monitor may be inserted into your vagina and uterus. Your health care provider will discuss the benefits and risks of using an internal monitor and obtain your permission before inserting the device. °3. Continuous fetal monitoring may be  needed if you have an epidural, are receiving certain medicines (such as oxytocin), or have pregnancy or labor complications. °11. An IV access tube may be placed into a vein in your arm to deliver fluids and medicines if necessary. °THREE STAGES OF LABOR AND DELIVERY °Normal labor and delivery is divided into three stages. °First Stage °This stage starts when you begin to contract regularly and your cervix begins to efface and dilate. It ends when your cervix is completely open (fully dilated). The first stage is the longest stage of labor and can last from 3 hours to 15 hours.  °Several methods are available to help with labor pain. You and your health care provider will decide which option is best for you. Options include:  °· Opioid medicines. These are strong pain medicines that you can get through your IV tube or as a shot into your muscle. These medicines lessen pain but do not make it go away completely.  °· Epidural. A medicine is given through a thin tube that is inserted in your back. The medicine numbs the lower part of your body and prevents any pain in that area. °· Paracervical pain medicine. This is an injection of an anesthetic on each side of your cervix.   °· You may request natural childbirth, which does not involve the use of pain medicines or an epidural during labor and delivery. Instead, you will use other things, such as breathing exercises, to help cope with the pain. °Second Stage °The second stage of labor begins when your cervix is fully dilated at 10 cm. It continues until you push your baby down through the birth canal and the baby is born. This stage can take only minutes or several hours. °· The location of your baby's head as it moves through the birth canal is reported as a number called a station. If the baby's head has not started its descent, the station is described as being at minus 3 (-3). When your baby's head is at the zero station, it is at the middle of the birth canal  and is engaged in the pelvis. The station of your baby helps indicate the progress of the second stage of labor. °· When your baby is born, your health care provider may hold the baby with his or her head lowered to prevent amniotic fluid, mucus, and blood from getting into the baby's lungs. The baby's mouth and nose may be suctioned with a small bulb syringe to remove any additional fluid. °· Your health care provider may then place the baby on your stomach. It is important to keep the baby from getting cold. To do this, the health care provider will dry   the baby off, place the baby directly on your skin (with no blankets between you and the baby), and cover the baby with warm, dry blankets.   °· The umbilical cord is cut. °Third Stage °During the third stage of labor, your health care provider will deliver the placenta (afterbirth) and make sure your bleeding is under control. The delivery of the placenta usually takes about 5 minutes but can take up to 30 minutes. After the placenta is delivered, a medicine may be given either by IV or injection to help contract the uterus and control bleeding. If you are planning to breastfeed, you can try to do so now. °After you deliver the placenta, your uterus should contract and get very firm. If your uterus does not remain firm, your health care provider will massage it. This is important because the contraction of the uterus helps cut off bleeding at the site where the placenta was attached to your uterus. If your uterus does not contract properly and stay firm, you may continue to bleed heavily. If there is a lot of bleeding, medicines may be given to contract the uterus and stop the bleeding.  °  °This information is not intended to replace advice given to you by your health care provider. Make sure you discuss any questions you have with your health care provider. °  °Document Released: 02/24/2008 Document Revised: 06/07/2014 Document Reviewed: 01/12/2012 °Elsevier  Interactive Patient Education ©2016 Elsevier Inc. ° °

## 2015-11-09 NOTE — MAU Note (Signed)
Pt states she is having contractions that started around 0000.  Pt states they are no more than 5-6 minutes apart but they only last like 20 seconds.  Pt states she is having vaginal bleeding that started last night.  Pt states she is feeling the baby move.

## 2015-11-09 NOTE — Progress Notes (Signed)
Shirley Baker. Lawson, CNM notified of pt in MAU.  Notified that pt complains of contractions lasting 20 seconds.  Pt states that she had some bleeding last night.  Notified that pt showed the RN pictures of the bleeding and it was bright red and mixed with mucus.  Notified that there were three spots on a pad that she wore for two hours last night.  Notified that pt is 1 cm, thick, and posterior.  Notified that there was no bleeding on the glove when pt was checked.  Notified that FHR tracing is not yet reactive but pt is currently drinking gingerale to help.  Provider states when strip gets reactive pt can be discharged.

## 2015-11-10 ENCOUNTER — Inpatient Hospital Stay (HOSPITAL_COMMUNITY)
Admission: AD | Admit: 2015-11-10 | Discharge: 2015-11-12 | DRG: 775 | Disposition: A | Discharge status: Home / Self Care | Payer: Medicaid Other | Source: Ambulatory Visit | Attending: Obstetrics and Gynecology | Admitting: Obstetrics and Gynecology

## 2015-11-10 ENCOUNTER — Encounter (HOSPITAL_COMMUNITY): Payer: Self-pay | Admitting: Emergency Medicine

## 2015-11-10 DIAGNOSIS — O99824 Streptococcus B carrier state complicating childbirth: Secondary | ICD-10-CM | POA: Diagnosis present

## 2015-11-10 DIAGNOSIS — Z3A38 38 weeks gestation of pregnancy: Secondary | ICD-10-CM

## 2015-11-10 LAB — CBC
HCT: 37.8 % (ref 36.0–46.0)
HEMOGLOBIN: 13.7 g/dL (ref 12.0–15.0)
MCH: 31.4 pg (ref 26.0–34.0)
MCHC: 36.2 g/dL — ABNORMAL HIGH (ref 30.0–36.0)
MCV: 86.7 fL (ref 78.0–100.0)
PLATELETS: 169 10*3/uL (ref 150–400)
RBC: 4.36 MIL/uL (ref 3.87–5.11)
RDW: 13.3 % (ref 11.5–15.5)
WBC: 9 10*3/uL (ref 4.0–10.5)

## 2015-11-10 LAB — TYPE AND SCREEN
ABO/RH(D): B POS
ANTIBODY SCREEN: NEGATIVE

## 2015-11-10 LAB — RPR: RPR: NONREACTIVE

## 2015-11-10 MED ORDER — FERROUS SULFATE 325 (65 FE) MG PO TABS
325.0000 mg | ORAL_TABLET | Freq: Two times a day (BID) | ORAL | Status: DC
  Administered 2015-11-10 – 2015-11-12 (×5): 325 mg via ORAL
  Filled 2015-11-10 (×6): qty 1

## 2015-11-10 MED ORDER — ONDANSETRON HCL 4 MG PO TABS: 4.0000 mg | ORAL_TABLET | ORAL | Status: DC | PRN

## 2015-11-10 MED ORDER — OXYTOCIN 10 UNIT/ML IJ SOLN
10.0000 [IU] | Freq: Once | INTRAMUSCULAR | Status: AC
  Administered 2015-11-10: 10 [IU] via INTRAMUSCULAR

## 2015-11-10 MED ORDER — SIMETHICONE 80 MG PO CHEW
80.0000 mg | CHEWABLE_TABLET | ORAL | Status: DC | PRN
  Filled 2015-11-10: qty 1

## 2015-11-10 MED ORDER — OXYTOCIN 10 UNIT/ML IJ SOLN
INTRAMUSCULAR | Status: AC
  Filled 2015-11-10: qty 1

## 2015-11-10 MED ORDER — MAGNESIUM HYDROXIDE 400 MG/5ML PO SUSP
30.0000 mL | ORAL | Status: DC | PRN
  Filled 2015-11-10: qty 30

## 2015-11-10 MED ORDER — BENZOCAINE-MENTHOL 20-0.5 % EX AERO
1.0000 "application " | INHALATION_SPRAY | CUTANEOUS | Status: DC | PRN
  Administered 2015-11-12: 1 via TOPICAL
  Filled 2015-11-10 (×2): qty 56

## 2015-11-10 MED ORDER — DIBUCAINE 1 % RE OINT
1.0000 "application " | TOPICAL_OINTMENT | RECTAL | Status: DC | PRN
  Administered 2015-11-12: 1 via RECTAL
  Filled 2015-11-10: qty 56.7
  Filled 2015-11-10: qty 28

## 2015-11-10 MED ORDER — OXYCODONE-ACETAMINOPHEN 5-325 MG PO TABS
1.0000 | ORAL_TABLET | ORAL | Status: DC | PRN
  Administered 2015-11-10 (×2): 1 via ORAL
  Filled 2015-11-10 (×2): qty 1

## 2015-11-10 MED ORDER — IBUPROFEN 600 MG PO TABS
600.0000 mg | ORAL_TABLET | Freq: Once | ORAL | Status: AC
  Administered 2015-11-10: 600 mg via ORAL
  Filled 2015-11-10: qty 1

## 2015-11-10 MED ORDER — ACETAMINOPHEN 325 MG PO TABS: 650.0000 mg | ORAL_TABLET | ORAL | Status: DC | PRN

## 2015-11-10 MED ORDER — PRENATAL MULTIVITAMIN CH
1.0000 | ORAL_TABLET | Freq: Every day | ORAL | Status: DC
  Administered 2015-11-10 – 2015-11-12 (×3): 1 via ORAL
  Filled 2015-11-10 (×3): qty 1

## 2015-11-10 MED ORDER — DIPHENHYDRAMINE HCL 25 MG PO CAPS: 25.0000 mg | ORAL_CAPSULE | Freq: Four times a day (QID) | ORAL | Status: DC | PRN

## 2015-11-10 MED ORDER — WITCH HAZEL-GLYCERIN EX PADS
1.0000 "application " | MEDICATED_PAD | CUTANEOUS | Status: DC | PRN
  Administered 2015-11-12: 1 via TOPICAL

## 2015-11-10 MED ORDER — MEASLES, MUMPS & RUBELLA VAC ~~LOC~~ INJ
0.5000 mL | INJECTION | Freq: Once | SUBCUTANEOUS | Status: DC
  Filled 2015-11-10: qty 0.5

## 2015-11-10 MED ORDER — COCONUT OIL OIL
1.0000 "application " | TOPICAL_OIL | Status: DC | PRN
  Filled 2015-11-10: qty 120

## 2015-11-10 MED ORDER — ONDANSETRON HCL 4 MG/2ML IJ SOLN: 4.0000 mg | INTRAMUSCULAR | Status: DC | PRN

## 2015-11-10 MED ORDER — OXYCODONE-ACETAMINOPHEN 5-325 MG PO TABS
2.0000 | ORAL_TABLET | ORAL | Status: DC | PRN
Start: 2015-11-10 — End: 2015-11-12

## 2015-11-10 MED ORDER — TETANUS-DIPHTH-ACELL PERTUSSIS 5-2.5-18.5 LF-MCG/0.5 IM SUSP: 0.5000 mL | Freq: Once | INTRAMUSCULAR | Status: DC

## 2015-11-10 MED ORDER — SENNOSIDES-DOCUSATE SODIUM 8.6-50 MG PO TABS
2.0000 | ORAL_TABLET | ORAL | Status: DC
  Administered 2015-11-11 (×2): 2 via ORAL
  Filled 2015-11-10 (×2): qty 2

## 2015-11-10 MED ORDER — IBUPROFEN 600 MG PO TABS
600.0000 mg | ORAL_TABLET | Freq: Four times a day (QID) | ORAL | Status: DC
  Administered 2015-11-10 – 2015-11-12 (×9): 600 mg via ORAL
  Filled 2015-11-10 (×8): qty 1

## 2015-11-10 MED ORDER — ZOLPIDEM TARTRATE 5 MG PO TABS: 5.0000 mg | ORAL_TABLET | Freq: Every evening | ORAL | Status: DC | PRN

## 2015-11-10 NOTE — Lactation Note (Signed)
This note was copied from a baby's chart. Lactation Consultation Note Experienced BF mom has a 26 yr old that she BF for 5 months. Denies difficulty BF. Mom states this baby is BF fine. Mom very sleepy, doing STS holding baby on chest. Mom encouraged to feed baby 8-12 times/24 hours and with feeding cues. Mom encouraged to waken baby for feeds. LPI information sheet given. Explained d/t weight baby would need to be supplemented d/t weight. Mom stated fine. Mom is breast/formula. Referred to Baby and Me Book in Breastfeeding section Pg. 22-23 for position options and Proper latch demonstration .Mom encouraged to do skin-to-skin.Referred to Baby and Me Book in Breastfeeding section Pg. 22-23 for position options and Proper latch demonstration. [place] brochure given w/resources, support groups and [place] services. DEBP kit taken to rm. Mom falling asleep. Will have set up at a later time while awake.  Patient Name: Shirley Baker [id number] Date: 11/10/2015 Reason for consult: Initial assessment   Maternal Data Has patient been taught Hand Expression?: Yes Does the patient have breastfeeding experience prior to this delivery?: Yes  Feeding Feeding Type: Breast Fed  LATCH Score/Interventions Latch: Grasps breast easily, tongue down, lips flanged, rhythmical sucking.  Audible Swallowing: Spontaneous and intermittent  Type of Nipple: Everted at rest and after stimulation  Comfort (Breast/Nipple): Soft / non-tender     Hold (Positioning): Assistance needed to correctly position infant at breast and maintain latch. Intervention(s): Breastfeeding basics reviewed;Support Pillows;Position options;Skin to skin  LATCH Score: 9  Lactation Tools Discussed/Used Tools: Pump Breast pump type: Double-Electric Breast Pump   Consult Status Consult Status: Follow-up Date: 11/10/15 Follow-up type: In-patient    Theodoro Kalata 11/10/2015, 5:55 AM

## 2015-11-10 NOTE — MAU Note (Signed)
Pt had trouble voiding post delivery and reports burning when urinationing. Bladder scan revealed >720 cc in bladder. Dermoplast applied and Percocet given. Pt up to BR and was then able to void. 246cc per Bladder scan. Fundus remains firm @ U with scant lochia.

## 2015-11-10 NOTE — Progress Notes (Signed)
UR chart review completed.  

## 2015-11-10 NOTE — Lactation Note (Signed)
This note was copied from a baby's chart. Lactation Consultation Note  Patient Name: Shirley Baker Today's Date: 11/10/2015   F/u visit at 11 hr of life. Request by Dr. Ezequiel EssexGable to see pt; RN informed this LC that Mom was requesting formula. I entered room & mother stated that she wanted formula b/c the baby "wasn't getting anything." Infant was suckling at breast, but no swallows were noted. Mom gave me permission to hand express; colostrum was easily obtained. This was spoon-fed to infant w/ease (excellent tongue mobility noted). I assisted w/relatching infant. Again, no swallows were noted & some dimpling was noted. Mom permitted me to do more hand-expression. The infant was spoon-fed for a total of 8mL of colostrum. Infant then ready to sleep.   Parents are from [place]. Mom nursed her 1st child for 5 months & did not begin giving formula until that child was 834 months old.   Parents were fine w/offering infant colostrum in lieu of formula at this time. I would recommend additional hand expression & spoon-feeding before introducing formula, if parents are in agreement. Shirley Baker, [facility] Baker [facility] 11/10/2015, 12:53 PM

## 2015-11-10 NOTE — H&P (Signed)
HPI: Shirley Baker is a 25 y.o. year old 172P2001 female at 523w1d weeks gestation who presents to MAU reporting Spontaneous rupture of membranes upon arrive to MUA and Labor, Urge to push upon arrival. Baby +3. Delivery in one contraction. See delivery note.  [facility]. Hgb AS.    History OB History    Gravida Para Term Preterm AB TAB SAB Ectopic Multiple Living   2 2 2       0 1     Past Medical History  Diagnosis Date  . Medical history non-contributory   . Syncope   . Heart murmur    Past Surgical History  Procedure Laterality Date  . No past surgeries     Family History: family history is not on file. Social History:  reports that she has never smoked. She has never used smokeless tobacco. She reports that she does not drink alcohol or use illicit drugs.   Prenatal Transfer Tool  Maternal Diabetes: No Genetic Screening: Normal Maternal Ultrasounds/Referrals: Normal Fetal Ultrasounds or other Referrals:  None Maternal Substance Abuse:  No Significant Maternal Medications:  None Significant Maternal Lab Results:  Lab values include: Group B Strep positive, Other:  Other Comments:  Mother has Hgb AS.   Review of Systems  Constitutional: Negative for fever.  Gastrointestinal: Positive for abdominal pain.  Genitourinary:       Neg for vaginal bleeding. Pos for LOF.     Dilation: 10 Effacement (%): 100 Station: +3 Exam by:: V. Bridget Westbrooks CNM Blood pressure 126/77, pulse 74, temperature 98.3 F (36.8 C), temperature source Oral, last menstrual period 02/16/2015, unknown if currently breastfeeding. Maternal Exam:  Uterine Assessment: Contraction strength is firm.  Contraction frequency is regular.   Abdomen: Estimated fetal weight is 6 lb.   Fetal presentation: vertex  Introitus: Normal vulva. Amniotic fluid character: clear.  Pelvis: adequate for delivery.   Cervix: Cervix evaluated by digital exam.     Fetal Exam Fetal  Monitor Review: Baseline rate: Not assessed .      Physical Exam  Vitals reviewed. Constitutional: She is oriented to person, place, and time. She appears well-developed and well-nourished. She appears distressed.  HENT:  Head: Normocephalic.  Eyes: Conjunctivae are normal.  Cardiovascular: Normal rate and regular rhythm.   Respiratory: Effort normal and breath sounds normal.  GI: Soft. There is no tenderness.  Genitourinary: Vagina normal.  Musculoskeletal: She exhibits no edema or tenderness.  Neurological: She is alert and oriented to person, place, and time. She has normal reflexes.  Skin: Skin is warm and dry.  Psychiatric: She has a normal mood and affect.    Prenatal labs: ABO, Rh: --/--/B POS (10/17 1956) Antibody:  Neg Rubella:  Immune RPR:   NR HBsAg:   Neg HIV: Non Reactive (10/17 1956)  GBS:   Pos First trimester screen: Neg 1 hour GTT 130  Assessment: 1. Labor: Vaginal delivery  2. Fetal Wellbeing: Category Not assessed due to rapid delivery  3. Pain Control: None 4. GBS: Pos-unTx'd 5. 38.1 week IUP  Plan:  1. Admit to Mother Baby per consult with MD 2. Routine Postpartum orders  Dorathy KinsmanSMITH, Tauna Macfarlane 11/10/2015, 2:08 AM

## 2015-11-10 NOTE — MAU Note (Signed)
Pt presents complaining of contractions. Pt undressing and SROM at 0123. Copious amounts of clear fluid. Pt began pushing immediately. Ivonne AndrewV. Smith CNM at bedside for delivery

## 2015-11-11 NOTE — Progress Notes (Signed)
Patient sleeping with baby.  Baby moved, mom did not wake up until baby placed in crib.  Educated about not sleeping with baby

## 2015-11-11 NOTE — Progress Notes (Signed)
POSTPARTUM PROGRESS NOTE  Post Partum Day 1 Subjective:  Shirley Baker is a 25 y.o. G2P2001 3659w1d s/p precipitous NSVD.  No acute events overnight.  Pt denies problems with ambulating, voiding or po intake.  She denies nausea or vomiting.  Pain is well controlled.  She has had flatus. She has had bowel movement.  Lochia Minimal.   Objective: Blood pressure 137/80, pulse 78, temperature 98.4 F (36.9 C), temperature source Oral, resp. rate 18, last menstrual period 02/16/2015, unknown if currently breastfeeding.  Physical Exam:  General: alert, cooperative and no distress Lochia:normal flow Chest: CTAB Heart: RRR no m/r/g Abdomen: +BS, soft, nontender,  Uterine Fundus: firm, 3 below umbilicus DVT Evaluation: No calf swelling or tenderness Extremities: No edema   Recent Labs  11/10/15 0226  HGB 13.7  HCT 37.8    Assessment/Plan:  ASSESSMENT: Shirley Baker is a 25 y.o. G2P2001 6159w1d s/p precipitous NSVD  Plan for d/c 6/14 Breastfeeding Discussed LARC, pt reaffirmed desires condom only, counseled on postpartum risk of pregnancy and safe sex practices   LOS: 1 day   Olena LeatherwoodKelly M Nahia Nissan 11/11/2015, 9:13 AM

## 2015-11-12 MED ORDER — IBUPROFEN 600 MG PO TABS: 600.0000 mg | ORAL_TABLET | Freq: Four times a day (QID) | ORAL | Status: DC

## 2015-11-12 MED ORDER — ACETAMINOPHEN 325 MG PO TABS: 650.0000 mg | ORAL_TABLET | ORAL | Status: DC | PRN

## 2015-11-12 NOTE — Discharge Summary (Signed)
OB Discharge Summary     Patient Name: Shirley Baker DOB: Oct 23, 1990 MRN: [id number]  Date of admission: 11/10/2015 Delivering MD: Dorathy Kinsman   Date of discharge: 11/12/2015  Admitting diagnosis: 38 WEEKS CTX Intrauterine pregnancy: [redacted]w[redacted]d     Secondary diagnosis:  Active Problems:   Vaginal delivery   SVD (spontaneous vaginal delivery)  Additional problems: none     Discharge diagnosis: Term Pregnancy Delivered                                                                                                Post partum procedures:none  Augmentation: none  Complications: None  Hospital course:  Onset of Labor With Vaginal Delivery     25 y.o. yo G2P2001 at [redacted]w[redacted]d was came in to admission unit in second stage labor and delivered right away on 11/10/2015.  Membrane Rupture Time/Date: 1:23 AM ,11/10/2015   Intrapartum Procedures: Episiotomy: None [1]                                         Lacerations:  Labial [10]  Patient had a delivery of a Viable infant. 11/10/2015  Information for the patient's newborn:  Joretta, Eads [[id number]]  Delivery Method: Vag-Spont    Pateint had an uncomplicated postpartum course.  She is ambulating, tolerating a regular diet, passing flatus, and urinating well. Patient is discharged home in stable condition on 11/12/2015.    Physical exam  Filed Vitals:   11/10/15 1800 11/11/15 0500 11/11/15 1811 11/12/15 0616  BP: 122/71 137/80 118/74 121/75  Pulse: 69 78 63 64  Temp: 98.3 F (36.8 C) 98.4 F (36.9 C) 98.1 F (36.7 C) 98.1 F (36.7 C)  TempSrc: Oral Oral Oral Oral  Resp: General: alert, cooperative and no distress Lochia: appropriate Uterine Fundus: firm Incision: N/A DVT Evaluation: No evidence of DVT seen on physical exam. Labs: Lab Results  Component Value Date   WBC 9.0 11/10/2015   HGB 13.7 11/10/2015   HCT 37.8 11/10/2015   MCV 86.7 11/10/2015   PLT 169 11/10/2015   No flowsheet data  found.  Discharge instruction: per After Visit Summary and "Baby and Me Booklet".  After visit meds:    Medication List    TAKE these medications        acetaminophen 325 MG tablet  Commonly known as:  TYLENOL  Take 2 tablets (650 mg total) by mouth every 4 (four) hours as needed (for pain scale < 4).     CONCEPT OB 130-92.4-1 MG Caps  Take 1 tablet by mouth daily.     ibuprofen 600 MG tablet  Commonly known as:  ADVIL,MOTRIN  Take 1 tablet (600 mg total) by mouth every 6 (six) hours.        Diet: routine diet  Activity: Advance as tolerated. Pelvic rest for 6 weeks.   Outpatient follow up:6 weeks Follow-up Information    Follow up with [facility] In 6 weeks.  Why:  Postpartum check   Contact information:   [address] [place] [address] [phone]       Postpartum contraception: Condoms  Newborn Data: Live born female  Birth Weight: 4 lb 15.9 oz (2265 g) APGAR: 8, 9  Baby Feeding: Breast Disposition:home with mother   11/12/2015 Almon Herculesaye T Gonfa, MD   OB FELLOW DISCHARGE ATTESTATION  I have seen and examined this patient and agree with above documentation in the resident's note.   Silvano BilisNoah B Ambrielle Kington, MD 11:27 AM

## 2015-11-12 NOTE — Lactation Note (Signed)
This note was copied from a baby's chart. Lactation Consultation Note  Patient Name: Shirley Baker MYUNK'L Date: 11/12/2015 Reason for consult: Follow-up assessment  Mom's milk is coming to volume (it will likely be completely in by the end of today/tomorrow morning). Mom with a hx of abundant milk supply. Mom cautioned against engorgement since she is also feeding formula. Mom shown how to assemble & use hand pump that was included in pump kit.   Parents are of the Muslim faith. If parents choose to supplement with formula, I advised them not to use Alimentum since it is not halal.   Parents have no questions or concerns at this time.  Matthias Hughs [facility] 11/12/2015, 9:18 AM

## 2015-11-12 NOTE — Discharge Instructions (Signed)

## 2015-11-18 ENCOUNTER — Inpatient Hospital Stay (HOSPITAL_COMMUNITY)
Admission: AD | Admit: 2015-11-18 | Discharge: 2015-11-18 | Disposition: A | Discharge status: Home / Self Care | Payer: Medicaid Other | Source: Ambulatory Visit | Attending: Family Medicine | Admitting: Family Medicine

## 2015-11-18 ENCOUNTER — Encounter (HOSPITAL_COMMUNITY): Payer: Self-pay | Admitting: *Deleted

## 2015-11-18 DIAGNOSIS — L299 Pruritus, unspecified: Secondary | ICD-10-CM | POA: Diagnosis present

## 2015-11-18 DIAGNOSIS — B373 Candidiasis of vulva and vagina: Secondary | ICD-10-CM

## 2015-11-18 DIAGNOSIS — B3731 Acute candidiasis of vulva and vagina: Secondary | ICD-10-CM

## 2015-11-18 DIAGNOSIS — L259 Unspecified contact dermatitis, unspecified cause: Secondary | ICD-10-CM

## 2015-11-18 MED ORDER — HYDROCORTISONE 1 % EX CREA: TOPICAL_CREAM | CUTANEOUS | Status: DC

## 2015-11-18 MED ORDER — NYSTATIN-TRIAMCINOLONE 100000-0.1 UNIT/GM-% EX CREA: TOPICAL_CREAM | CUTANEOUS | Status: DC

## 2015-11-18 NOTE — MAU Note (Signed)
First noted in mouth, little blisters  Around lips, about 4 days ago.  Then started breaking out on hands, arms, face, body.  Small little blisters noted on fingers, itches.  Pt denies shortness of breath or resp problems.

## 2015-11-18 NOTE — MAU Provider Note (Signed)
Chief Complaint: Allergic Reaction   First Provider Initiated Contact with Patient 11/18/15 1836      SUBJECTIVE HPI: Shirley Baker is a 25 y.o. G2P2002 at Unknown by LMP who presents to maternity admissions reporting onset of itching prior to discharge from the hospital on 6/14, then rash starting on her mouth 4-5 days ago, then on her hands 3 days ago. She also reports vaginal itching with irritated skin on her vagina and toward her rectum starting 2-3 days ago.  She denies exposure to illness, and denies changes to soaps or detergents.  She reports taking ibuprofen and Percocet in the hospital, and continuing ibuprofen at home.  She denies any known allergies.  No other family member has a rash.  She reports normal lochia, lighter today than when she was discharged postpartum and denies pain. She denies vaginal bleeding, urinary symptoms, h/a, dizziness, n/v, or fever/chills.     HPI  Past Medical History  Diagnosis Date  . Syncope   . Heart murmur    Past Surgical History  Procedure Laterality Date  . No past surgeries     Social History   Social History  . Marital Status: Single    Spouse Name: N/A  . Number of Children: N/A  . Years of Education: N/A   Occupational History  . Not on file.   Social History Main Topics  . Smoking status: Never Smoker   . Smokeless tobacco: Never Used  . Alcohol Use: No  . Drug Use: No  . Sexual Activity: Yes    Birth Control/ Protection: None   Other Topics Concern  . Not on file   Social History Narrative   No current facility-administered medications on file prior to encounter.   Current Outpatient Prescriptions on File Prior to Encounter  Medication Sig Dispense Refill  . acetaminophen (TYLENOL) 325 MG tablet Take 2 tablets (650 mg total) by mouth every 4 (four) hours as needed (for pain scale < 4). 30 tablet 0  . ibuprofen (ADVIL,MOTRIN) 600 MG tablet Take 1 tablet (600 mg total) by mouth every 6 (six) hours. 30 tablet 0  .  Prenat w/o A Vit-FeFum-FePo-FA (CONCEPT OB) 130-92.4-1 MG CAPS Take 1 tablet by mouth daily. 30 capsule 12   No Known Allergies  ROS:  Review of Systems  Constitutional: Negative for fever, chills and fatigue.  Respiratory: Negative for shortness of breath.   Cardiovascular: Negative for chest pain.  Genitourinary: Negative for dysuria, flank pain, vaginal bleeding, vaginal discharge, difficulty urinating, vaginal pain and pelvic pain.  Skin: Positive for rash.  Neurological: Negative for dizziness and headaches.  Psychiatric/Behavioral: Negative.      I have reviewed patient's Past Medical Hx, Surgical Hx, Family Hx, Social Hx, medications and allergies.   Physical Exam  Patient Vitals for the past 24 hrs:  BP Temp Temp src Pulse Resp SpO2  11/18/15 1818 129/86 mmHg 98.4 F (36.9 C) Oral 61 16 -  11/18/15 1817 - - - - - 98 %   Constitutional: Well-developed, well-nourished female in no acute distress.  Cardiovascular: normal rate Respiratory: normal effort GI: Abd soft, non-tender. Pos BS x 4 MS: Extremities nontender, no edema, normal ROM Neurologic: Alert and oriented x 4.  GU: Neg CVAT. Skin:   PELVIC EXAM: visual inspection reveals mild erythema, no edema of external genitalia    MAU Management/MDM: Kerrie Buffalo, NP, to bedside to evaluate rash.  Likely allergic dermatitis with unknown source.  Will treat with topical hydrocortisone and if not  improved in 48 hours pt to f/u with primary care. Will treat likely vaginal yeast with Mycolog. Pt may use Benadryl PO for itching.  Pt stable at time of discharge.  ASSESSMENT 1. Contact dermatitis   2. Vaginal candidiasis     PLAN Discharge home   Medication List    TAKE these medications        acetaminophen 325 MG tablet  Commonly known as:  TYLENOL  Take 2 tablets (650 mg total) by mouth every 4 (four) hours as needed (for pain scale < 4).     CONCEPT OB 130-92.4-1 MG Caps  Take 1 tablet by mouth daily.      hydrocortisone cream 1 %  Apply to affected area 2 times daily     ibuprofen 600 MG tablet  Commonly known as:  ADVIL,MOTRIN  Take 1 tablet (600 mg total) by mouth every 6 (six) hours.     nystatin-triamcinolone cream  Commonly known as:  MYCOLOG II  Apply to affected area daily           Follow-up Information    Please follow up.   Why:  With primary care if symptoms persist, Return to MAU as needed for emergencies      Please follow up.   Why:  Your postpartum visit at 4-6 weeks as scheduled      Sharen CounterLisa Leftwich-Kirby Certified Nurse-Midwife 11/18/2015  6:53 PM

## 2015-11-18 NOTE — Discharge Instructions (Signed)
Contact Dermatitis °Dermatitis is redness, soreness, and swelling (inflammation) of the skin. Contact dermatitis is a reaction to certain substances that touch the skin. There are two types of contact dermatitis:  °· Irritant contact dermatitis. This type is caused by something that irritates your skin, such as dry hands from washing them too much. This type does not require previous exposure to the substance for a reaction to occur. This type is more common. °· Allergic contact dermatitis. This type is caused by a substance that you are allergic to, such as a nickel allergy or poison ivy. This type only occurs if you have been exposed to the substance (allergen) before. Upon a repeat exposure, your body reacts to the substance. This type is less common. °CAUSES  °Many different substances can cause contact dermatitis. Irritant contact dermatitis is most commonly caused by exposure to:  °· Makeup.   °· Soaps.   °· Detergents.   °· Bleaches.   °· Acids.   °· Metal salts, such as nickel.   °Allergic contact dermatitis is most commonly caused by exposure to:  °· Poisonous plants.   °· Chemicals.   °· Jewelry.   °· Latex.   °· Medicines.   °· Preservatives in products, such as clothing.   °RISK FACTORS °This condition is more likely to develop in:  °· People who have jobs that expose them to irritants or allergens. °· People who have certain medical conditions, such as asthma or eczema.   °SYMPTOMS  °Symptoms of this condition may occur anywhere on your body where the irritant has touched you or is touched by you. Symptoms include: °· Dryness or flaking.   °· Redness.   °· Cracks.   °· Itching.   °· Pain or a burning feeling.   °· Blisters. °· Drainage of small amounts of blood or clear fluid from skin cracks. °With allergic contact dermatitis, there may also be swelling in areas such as the eyelids, mouth, or genitals.  °DIAGNOSIS  °This condition is diagnosed with a medical history and physical exam. A patch skin test  may be performed to help determine the cause. If the condition is related to your job, you may need to see an occupational medicine specialist. °TREATMENT °Treatment for this condition includes figuring out what caused the reaction and protecting your skin from further contact. Treatment may also include:  °· Steroid creams or ointments. Oral steroid medicines may be needed in more severe cases. °· Antibiotics or antibacterial ointments, if a skin infection is present. °· Antihistamine lotion or an antihistamine taken by mouth to ease itching. °· A bandage (dressing). °HOME CARE INSTRUCTIONS °Skin Care  °· Moisturize your skin as needed.   °· Apply cool compresses to the affected areas. °· Try taking a bath with: °¨ Epsom salts. Follow the instructions on the packaging. You can get these at your local pharmacy or grocery store. °¨ Baking soda. Pour a small amount into the bath as directed by your health care provider. °¨ Colloidal oatmeal. Follow the instructions on the packaging. You can get this at your local pharmacy or grocery store. °· Try applying baking soda paste to your skin. Stir water into baking soda until it reaches a paste-like consistency. °· Do not scratch your skin. °· Bathe less frequently, such as every other day. °· Bathe in lukewarm water. Avoid using hot water. °Medicines  °· Take or apply over-the-counter and prescription medicines only as told by your health care provider.   °· If you were prescribed an antibiotic medicine, take or apply your antibiotic as told by your health care provider. Do not stop using the   antibiotic even if your condition starts to improve. General Instructions  Keep all follow-up visits as told by your health care provider. This is important.  Avoid the substance that caused your reaction. If you do not know what caused it, keep a journal to try to track what caused it. Write down:  What you eat.  What cosmetic products you use.  What you drink.  What  you wear in the affected area. This includes jewelry.  If you were given a dressing, take care of it as told by your health care provider. This includes when to change and remove it. SEEK MEDICAL CARE IF:   Your condition does not improve with treatment.  Your condition gets worse.  You have signs of infection such as swelling, tenderness, redness, soreness, or warmth in the affected area.  You have a fever.  You have new symptoms. SEEK IMMEDIATE MEDICAL CARE IF:   You have a severe headache, neck pain, or neck stiffness.  You vomit.  You feel very sleepy.  You notice red streaks coming from the affected area.  Your bone or joint underneath the affected area becomes painful after the skin has healed.  The affected area turns darker.  You have difficulty breathing.   This information is not intended to replace advice given to you by your health care provider. Make sure you discuss any questions you have with your health care provider.   Document Released: 05/14/2000 Document Revised: 02/05/2015 Document Reviewed: 10/02/2014 Elsevier Interactive Patient Education 2016 Elsevier Inc. Monilial Vaginitis Vaginitis in a soreness, swelling and redness (inflammation) of the vagina and vulva. Monilial vaginitis is not a sexually transmitted infection. CAUSES  Yeast vaginitis is caused by yeast (candida) that is normally found in your vagina. With a yeast infection, the candida has overgrown in number to a point that upsets the chemical balance. SYMPTOMS   White, thick vaginal discharge.  Swelling, itching, redness and irritation of the vagina and possibly the lips of the vagina (vulva).  Burning or painful urination.  Painful intercourse. DIAGNOSIS  Things that may contribute to monilial vaginitis are:  Postmenopausal and virginal states.  Pregnancy.  Infections.  Being tired, sick or stressed, especially if you had monilial vaginitis in the past.  Diabetes. Good  control will help lower the chance.  Birth control pills.  Tight fitting garments.  Using bubble bath, feminine sprays, douches or deodorant tampons.  Taking certain medications that kill germs (antibiotics).  Sporadic recurrence can occur if you become ill. TREATMENT  Your caregiver will give you medication.  There are several kinds of anti monilial vaginal creams and suppositories specific for monilial vaginitis. For recurrent yeast infections, use a suppository or cream in the vagina 2 times a week, or as directed.  Anti-monilial or steroid cream for the itching or irritation of the vulva may also be used. Get your caregiver's permission.  Painting the vagina with methylene blue solution may help if the monilial cream does not work.  Eating yogurt may help prevent monilial vaginitis. HOME CARE INSTRUCTIONS   Finish all medication as prescribed.  Do not have sex until treatment is completed or after your caregiver tells you it is okay.  Take warm sitz baths.  Do not douche.  Do not use tampons, especially scented ones.  Wear cotton underwear.  Avoid tight pants and panty hose.  Tell your sexual partner that you have a yeast infection. They should go to their caregiver if they have symptoms such as mild rash  rash or itching.  Your sexual partner should be treated as well if your infection is difficult to eliminate.  Practice safer sex. Use condoms.  Some vaginal medications cause latex condoms to fail. Vaginal medications that harm condoms are:  Cleocin cream.  Butoconazole (Femstat).  Terconazole (Terazol) vaginal suppository.  Miconazole (Monistat) (may be purchased over the counter). SEEK MEDICAL CARE IF:   You have a temperature by mouth above 102 F (38.9 C).  The infection is getting worse after 2 days of treatment.  The infection is not getting better after 3 days of treatment.  You develop blisters in or around your vagina.  You develop vaginal  bleeding, and it is not your menstrual period.  You have pain when you urinate.  You develop intestinal problems.  You have pain with sexual intercourse.   This information is not intended to replace advice given to you by your health care provider. Make sure you discuss any questions you have with your health care provider.   Document Released: 02/24/2005 Document Revised: 08/09/2011 Document Reviewed: 11/18/2014 Elsevier Interactive Patient Education 2016 Elsevier Inc.  

## 2015-12-10 NOTE — Progress Notes (Signed)
09/30/15 Dae Highley, PATIENT WAS GIVEN HARDSHIP PAPER TO FILL OUT,WILL SCHEDU.WE SHE RETURN ALL INFOM.---Lynford HumphreyHANKS  Received: 2 months ago    Dalene Seltzereborah D Miller  Kely Dohn L Allenmichael Mcpartlin, RN

## 2017-08-18 IMAGING — US US MFM FETAL BPP W/O NON-STRESS
1 series · 12 of 12 positions shown · non-contrast
Comparison: none

[Series 1: us mfm fetal bpp w/o non-stress · 12 acquisitions, 12 frames shown]
[im 1/12]
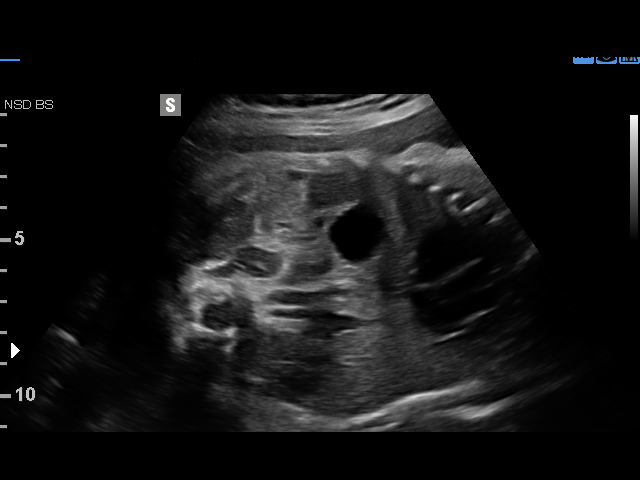
[im 2/12]
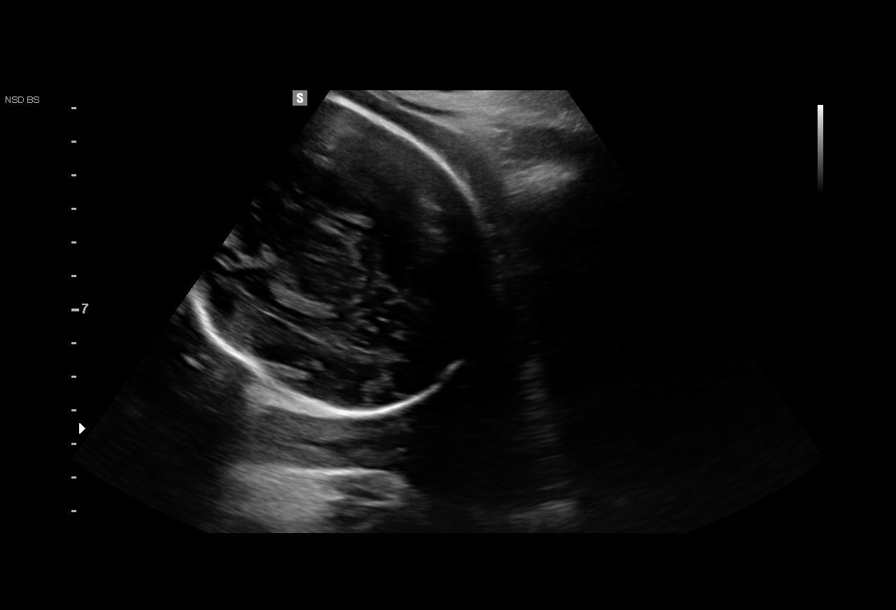
[im 3/12]
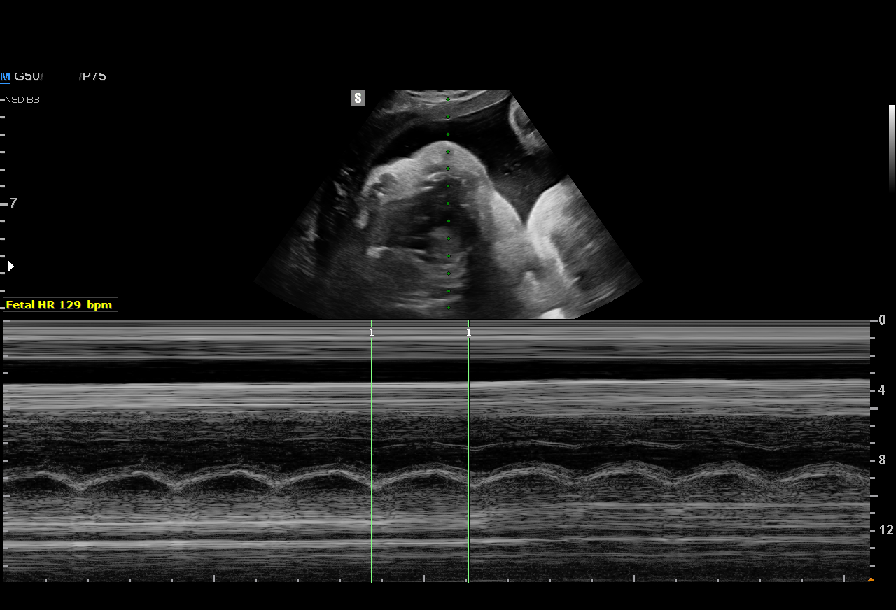
[im 4/12]
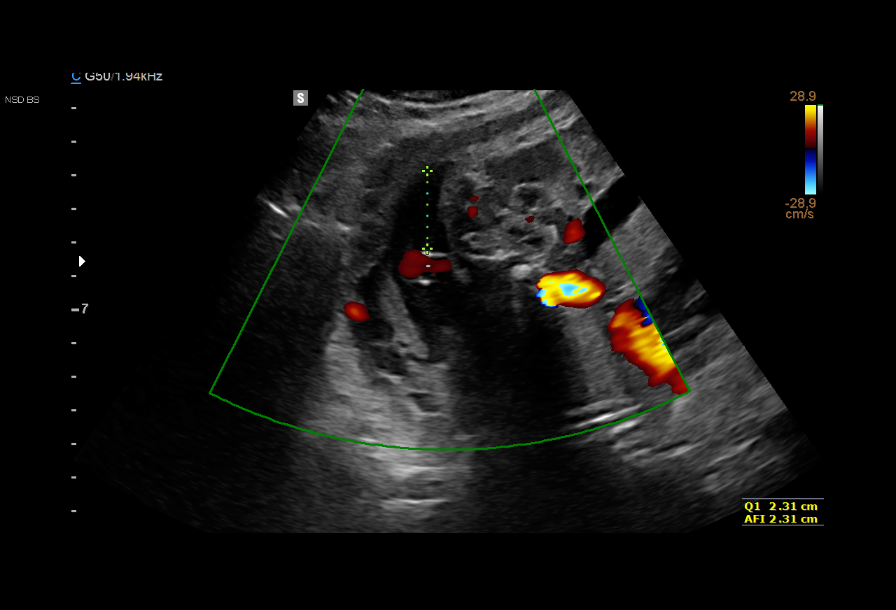
[im 5/12]
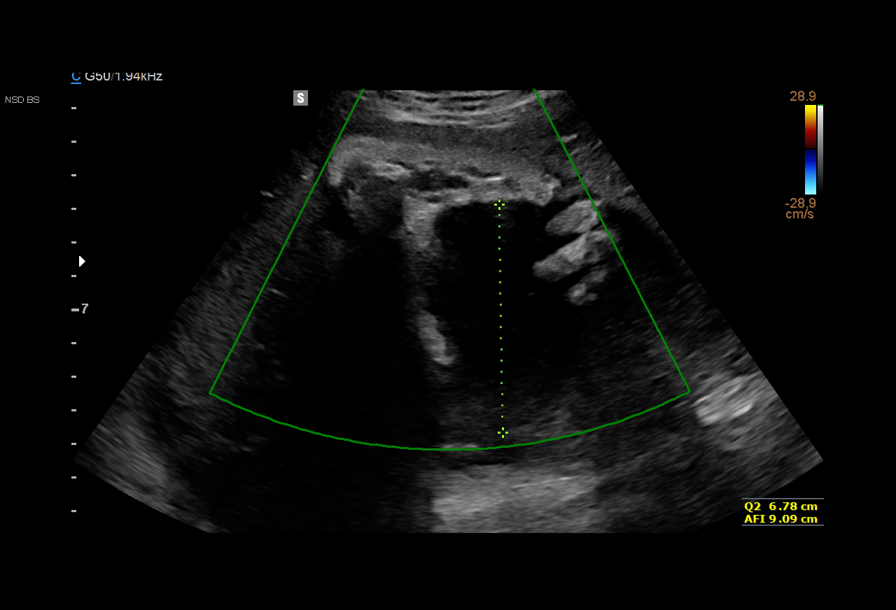
[im 6/12]
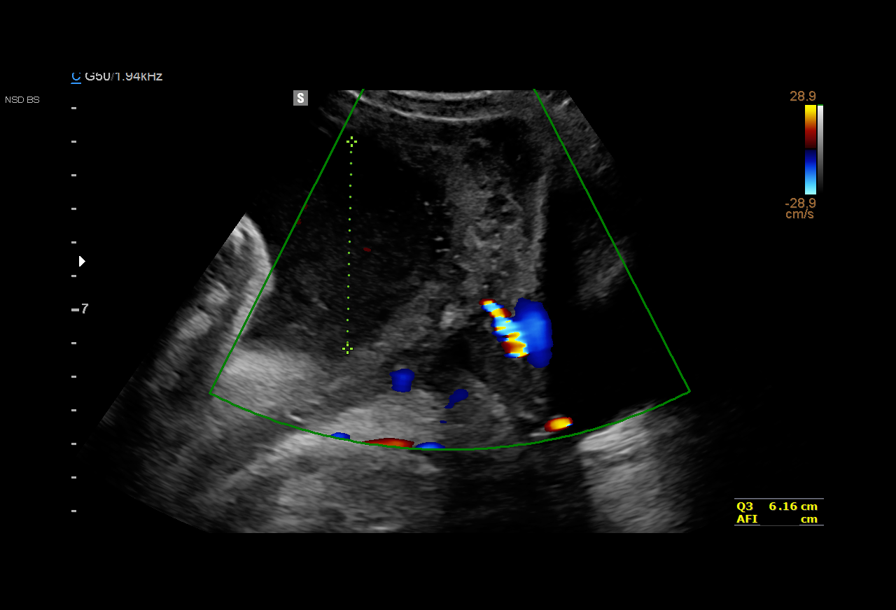
[im 7/12]
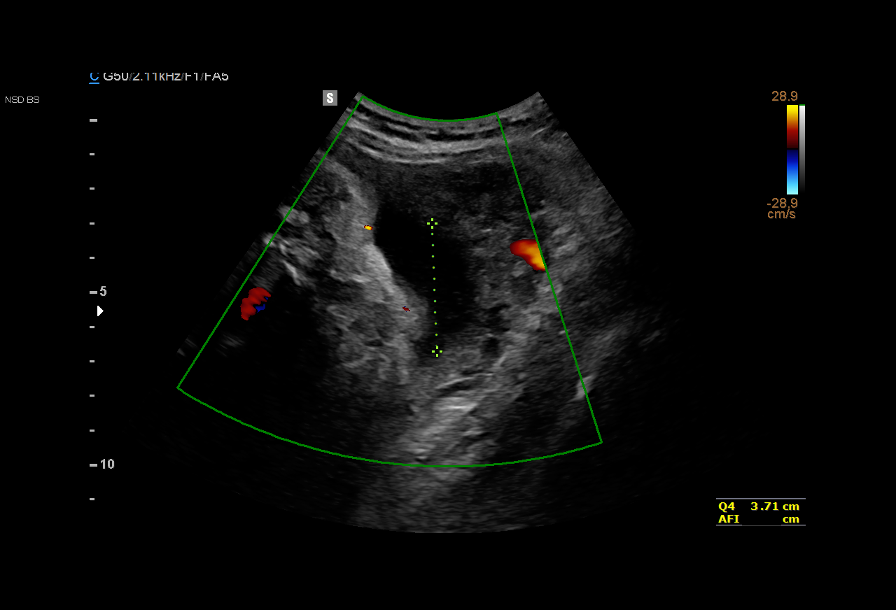
[im 8/12]
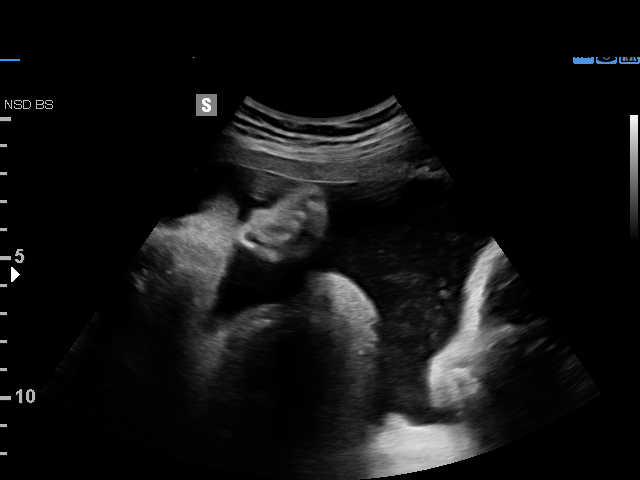
[im 9/12]
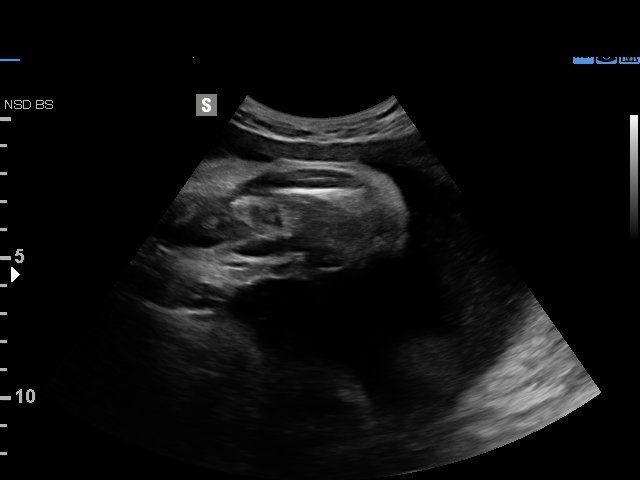
[im 10/12]
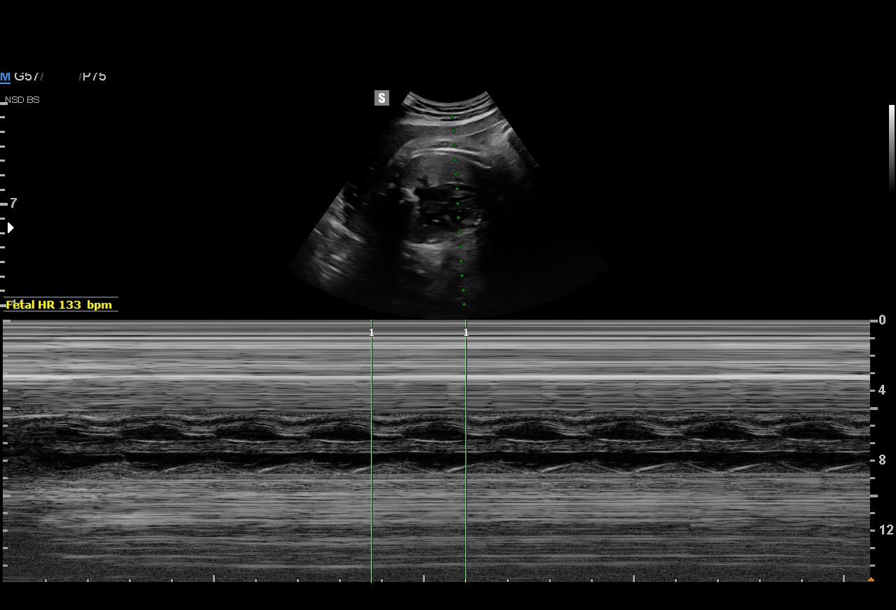
[im 11/12]
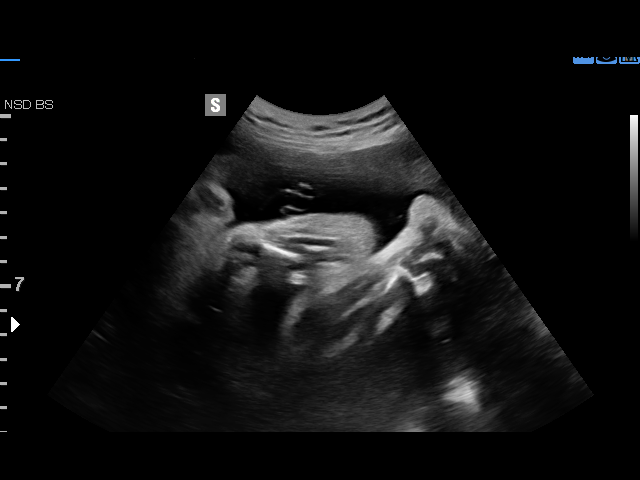
[im 12/12]
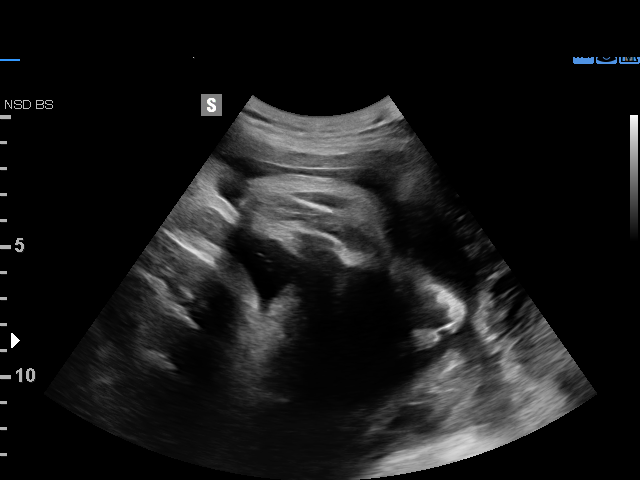

[12 of 12 positions shown; findings below may reference images not displayed]

MAU/Triage
[REDACTED]-
Faculty Physician

1  HARDIK CORNELISON             [id number]      [id number]     [id number]
Indications

38 weeks gestation of pregnancy
Abdominal pain in pregnancy
Vaginal bleeding in pregnancy, third trimester
Non-reactive NST
OB History

Gravidity:    2         Term:   1
Living:       1
Fetal Evaluation

Num Of Fetuses:     1
Fetal Heart         133
Rate(bpm):
Cardiac Activity:   Observed
Presentation:       Cephalic

Amniotic Fluid
AFI FV:      Subjectively upper-normal

AFI Sum(cm)     %Tile       Largest Pocket(cm)
18.96           74

RUQ(cm)       RLQ(cm)       LUQ(cm)        LLQ(cm)
2.31
Biophysical Evaluation
Amniotic F.V:   Pocket => 2 cm two         F. Tone:        Observed
planes
F. Movement:    Observed                   Score:          [DATE]
F. Breathing:   Observed
Gestational Age

LMP:           38w 0d       Date:   02/16/15                 EDD:   11/23/15
Best:          38w 0d    Det. By:   LMP  (02/16/15)          EDD:   11/23/15
Impression

SIUP at 38+0 weeks
Cephalic presentation
High normal amniotic fluid volume
BPP [DATE]
(Placenta not imaged)
Recommendations

Follow-up as clinically indicated

## 2018-02-04 ENCOUNTER — Inpatient Hospital Stay (HOSPITAL_COMMUNITY)
Admission: AD | Admit: 2018-02-04 | Discharge: 2018-02-05 | Disposition: A | Discharge status: Home / Self Care | Payer: Medicaid Other | Source: Ambulatory Visit | Attending: Obstetrics and Gynecology | Admitting: Obstetrics and Gynecology

## 2018-02-04 DIAGNOSIS — Z8632 Personal history of gestational diabetes: Secondary | ICD-10-CM | POA: Insufficient documentation

## 2018-02-04 DIAGNOSIS — O26891 Other specified pregnancy related conditions, first trimester: Secondary | ICD-10-CM | POA: Insufficient documentation

## 2018-02-04 DIAGNOSIS — O283 Abnormal ultrasonic finding on antenatal screening of mother: Secondary | ICD-10-CM | POA: Insufficient documentation

## 2018-02-04 DIAGNOSIS — O3680X0 Pregnancy with inconclusive fetal viability, not applicable or unspecified: Secondary | ICD-10-CM

## 2018-02-04 DIAGNOSIS — R102 Pelvic and perineal pain: Secondary | ICD-10-CM | POA: Insufficient documentation

## 2018-02-04 DIAGNOSIS — O208 Other hemorrhage in early pregnancy: Secondary | ICD-10-CM | POA: Insufficient documentation

## 2018-02-04 DIAGNOSIS — Z3A01 Less than 8 weeks gestation of pregnancy: Secondary | ICD-10-CM | POA: Insufficient documentation

## 2018-02-05 ENCOUNTER — Inpatient Hospital Stay (HOSPITAL_COMMUNITY): Payer: Medicaid Other

## 2018-02-05 ENCOUNTER — Encounter (HOSPITAL_COMMUNITY): Payer: Self-pay | Admitting: *Deleted

## 2018-02-05 DIAGNOSIS — Z8632 Personal history of gestational diabetes: Secondary | ICD-10-CM | POA: Diagnosis not present

## 2018-02-05 DIAGNOSIS — O26891 Other specified pregnancy related conditions, first trimester: Secondary | ICD-10-CM

## 2018-02-05 DIAGNOSIS — O283 Abnormal ultrasonic finding on antenatal screening of mother: Secondary | ICD-10-CM | POA: Diagnosis not present

## 2018-02-05 DIAGNOSIS — R102 Pelvic and perineal pain: Secondary | ICD-10-CM

## 2018-02-05 DIAGNOSIS — Z3A01 Less than 8 weeks gestation of pregnancy: Secondary | ICD-10-CM | POA: Diagnosis not present

## 2018-02-05 DIAGNOSIS — O208 Other hemorrhage in early pregnancy: Secondary | ICD-10-CM | POA: Diagnosis not present

## 2018-02-05 LAB — URINALYSIS, ROUTINE W REFLEX MICROSCOPIC
Bilirubin Urine: NEGATIVE
GLUCOSE, UA: NEGATIVE mg/dL
HGB URINE DIPSTICK: NEGATIVE
KETONES UR: NEGATIVE mg/dL
LEUKOCYTES UA: NEGATIVE
Nitrite: NEGATIVE
PROTEIN: NEGATIVE mg/dL
Specific Gravity, Urine: 1.011 (ref 1.005–1.030)
pH: 5 (ref 5.0–8.0)

## 2018-02-05 LAB — WET PREP, GENITAL
Clue Cells Wet Prep HPF POC: NONE SEEN
Sperm: NONE SEEN
Trich, Wet Prep: NONE SEEN
Yeast Wet Prep HPF POC: NONE SEEN

## 2018-02-05 LAB — HCG, QUANTITATIVE, PREGNANCY: HCG, BETA CHAIN, QUANT, S: 5256 m[IU]/mL — AB (ref <5)

## 2018-02-05 LAB — CBC
HCT: 37.7 % (ref 36.0–46.0)
Hemoglobin: 13.2 g/dL (ref 12.0–15.0)
MCH: 30.8 pg (ref 26.0–34.0)
MCHC: 35 g/dL (ref 30.0–36.0)
MCV: 88.1 fL (ref 78.0–100.0)
Platelets: 227 10*3/uL (ref 150–400)
RBC: 4.28 MIL/uL (ref 3.87–5.11)
RDW: 12.7 % (ref 11.5–15.5)
WBC: 6.3 10*3/uL (ref 4.0–10.5)

## 2018-02-05 LAB — POCT PREGNANCY, URINE: PREG TEST UR: POSITIVE — AB

## 2018-02-05 NOTE — Progress Notes (Signed)
Written and verbal d/c instructions given and understanding voiced. To return Tues to clinic at 1100 for repeat BHCG or to MAU sooner for any concerns

## 2018-02-05 NOTE — MAU Provider Note (Addendum)
History     CSN: [id number]  Arrival date and time: 02/04/18 2359   First Provider Initiated Contact with Patient 02/05/18 0030      Chief Complaint  Patient presents with  . Pelvic Pain  . Possible Pregnancy   Shirley Baker is a 27 y.o. G3P2002 at [redacted]w[redacted]d who presents today with RLQ pain x 2 days. She denies any vaginal bleeding. +UPT at home.   Pelvic Pain  The patient's primary symptoms include pelvic pain. The patient's pertinent negatives include no vaginal discharge. This is a new problem. Episode onset: 2 days  The problem occurs constantly. The problem has been unchanged. Pain severity now: 8/10. The problem affects the right side. She is pregnant. Pertinent negatives include no dysuria or frequency. The vaginal discharge was normal. There has been no bleeding. Nothing aggravates the symptoms. She has tried nothing for the symptoms. She uses nothing for contraception. Her menstrual history has been regular (LMP 01/02/18 ).    OB History    Gravida  3   Para  2   Term  2   Preterm      AB      Living  2     SAB      TAB      Ectopic      Multiple  0   Live Births  1           Past Medical History:  Diagnosis Date  . Gestational diabetes   . Heart murmur   . Syncope     Past Surgical History:  Procedure Laterality Date  . NO PAST SURGERIES      History reviewed. No pertinent family history.  Social History   Tobacco Use  . Smoking status: Never Smoker  . Smokeless tobacco: Never Used  Substance Use Topics  . Alcohol use: No  . Drug use: No    Allergies: No Known Allergies  No medications prior to admission.    Review of Systems  Genitourinary: Positive for pelvic pain. Negative for dysuria, frequency, vaginal bleeding and vaginal discharge.   Physical Exam   Blood pressure 114/83, pulse 75, temperature 98.3 F (36.8 C), resp. rate 18, height 5\' 1"  (1.549 m), weight 54.9 kg, last menstrual period 01/02/2018, currently  breastfeeding.  Physical Exam  Nursing note and vitals reviewed. Constitutional: She is oriented to person, place, and time. She appears well-developed and well-nourished. No distress.  HENT:  Head: Normocephalic.  Cardiovascular: Normal rate.  Respiratory: Effort normal.  GI: Soft. There is no tenderness.  Neurological: She is alert and oriented to person, place, and time.  Skin: Skin is warm and dry.  Psychiatric: She has a normal mood and affect.   Results for orders placed or performed during the hospital encounter of 02/04/18 (from the past 24 hour(s))  Urinalysis, Routine w reflex microscopic     Status: Abnormal   Collection Time: 02/05/18 12:22 AM  Result Value Ref Range   Color, Urine STRAW (A) YELLOW   APPearance CLEAR CLEAR   Specific Gravity, Urine 1.011 1.005 - 1.030   pH 5.0 5.0 - 8.0   Glucose, UA NEGATIVE NEGATIVE mg/dL   Hgb urine dipstick NEGATIVE NEGATIVE   Bilirubin Urine NEGATIVE NEGATIVE   Ketones, ur NEGATIVE NEGATIVE mg/dL   Protein, ur NEGATIVE NEGATIVE mg/dL   Nitrite NEGATIVE NEGATIVE   Leukocytes, UA NEGATIVE NEGATIVE  Pregnancy, urine POC     Status: Abnormal   Collection Time: 02/05/18 12:25 AM  Result Value  Ref Range   Preg Test, Ur POSITIVE (A) NEGATIVE  Wet prep, genital     Status: Abnormal   Collection Time: 02/05/18 12:40 AM  Result Value Ref Range   Yeast Wet Prep HPF POC NONE SEEN NONE SEEN   Trich, Wet Prep NONE SEEN NONE SEEN   Clue Cells Wet Prep HPF POC NONE SEEN NONE SEEN   WBC, Wet Prep HPF POC MANY (A) NONE SEEN   Sperm NONE SEEN   CBC     Status: None   Collection Time: 02/05/18 12:50 AM  Result Value Ref Range   WBC 6.3 4.0 - 10.5 K/uL   RBC 4.28 3.87 - 5.11 MIL/uL   Hemoglobin 13.2 12.0 - 15.0 g/dL   HCT 16.1 09.6 - 04.5 %   MCV 88.1 78.0 - 100.0 fL   MCH 30.8 26.0 - 34.0 pg   MCHC 35.0 30.0 - 36.0 g/dL   RDW 40.9 81.1 - 91.4 %   Platelets 227 150 - 400 K/uL  hCG, quantitative, pregnancy     Status: Abnormal    Collection Time: 02/05/18 12:50 AM  Result Value Ref Range   hCG, Beta Chain, Quant, S 5,256 (H) <5 mIU/mL   US Ob Comp Less 14 Wks  Result Date: 02/05/2018 CLINICAL DATA:  Right pelvic pain. Estimated gestational age by LMP is 4 weeks 6 days. Quantitative beta HCG is not recorded. EXAM: OBSTETRIC <14 WK [place] AND TRANSVAGINAL OB US TECHNIQUE: Both transabdominal and transvaginal ultrasound examinations were performed for complete evaluation of the gestation as well as the maternal uterus, adnexal regions, and pelvic cul-de-sac. Transvaginal technique was performed to assess early pregnancy. COMPARISON:  None. FINDINGS: Intrauterine gestational sac: A single intrauterine gestational sac is identified. Yolk sac:  Not Visualized. Embryo:  Not Visualized. Cardiac Activity: Not Visualized. MSD: 3.6 mm   5 w   0 d Subchorionic hemorrhage: A small subchorionic hemorrhage is identified superiorly. Maternal uterus/adnexae: Uterus is anteverted. No myometrial mass lesions. Both ovaries are identified and appear normal. Corpus luteal cyst on the right. Small amount of free fluid in the pelvis. IMPRESSION: Probable early intrauterine gestational sac, but no yolk sac, fetal pole, or cardiac activity yet visualized. Recommend follow-up quantitative B-HCG levels and follow-up US in 14 days to assess viability. This recommendation follows SRU consensus guidelines: Diagnostic Criteria for Nonviable Pregnancy Early in the First Trimester. Malva Limes Med 2013; 782:9562-13. Electronically Signed   By: Burman Nieves M.D.   On: 02/05/2018 01:28   US Ob Transvaginal  Result Date: 02/05/2018 CLINICAL DATA:  Right pelvic pain. Estimated gestational age by LMP is 4 weeks 6 days. Quantitative beta HCG is not recorded. EXAM: OBSTETRIC <14 WK [place] AND TRANSVAGINAL OB US TECHNIQUE: Both transabdominal and transvaginal ultrasound examinations were performed for complete evaluation of the gestation as well as the maternal uterus, adnexal  regions, and pelvic cul-de-sac. Transvaginal technique was performed to assess early pregnancy. COMPARISON:  None. FINDINGS: Intrauterine gestational sac: A single intrauterine gestational sac is identified. Yolk sac:  Not Visualized. Embryo:  Not Visualized. Cardiac Activity: Not Visualized. MSD: 3.6 mm   5 w   0 d Subchorionic hemorrhage: A small subchorionic hemorrhage is identified superiorly. Maternal uterus/adnexae: Uterus is anteverted. No myometrial mass lesions. Both ovaries are identified and appear normal. Corpus luteal cyst on the right. Small amount of free fluid in the pelvis. IMPRESSION: Probable early intrauterine gestational sac, but no yolk sac, fetal pole, or cardiac activity yet visualized. Recommend follow-up quantitative  B-HCG levels and follow-up US in 14 days to assess viability. This recommendation follows SRU consensus guidelines: Diagnostic Criteria for Nonviable Pregnancy Early in the First Trimester. Malva Limes Med 2013; 882:8003-49. Electronically Signed   By: Burman Nieves M.D.   On: 02/05/2018 01:28   MAU Course  Procedures  MDM  Assessment and Plan   1. Pregnancy, location unknown   2. Pelvic pain in pregnancy, antepartum, first trimester    DC home Comfort measures reviewed  1st Trimester precautions  Bleeding precautions Ectopic precautions RX: none  Return to MAU as needed  Follow-up Information    [facility] Follow up.   Specialty:  Obstetrics and Gynecology Why:  02/07/18 at 11:00  Contact information: [address] [place] [address] [phone]           Thressa Sheller 02/05/2018, 2:29 AM

## 2018-02-05 NOTE — MAU Note (Signed)
Having cramping R lower abd that radiates along lower abd for 2 nights. Last time I felt this way I was pregnant. Took upt and was positive. Denies vag bleeding or d/c. LMP 8/10

## 2018-02-05 NOTE — Discharge Instructions (Signed)
Ectopic Pregnancy An ectopic pregnancy is when the fertilized egg attaches (implants) outside the uterus. Most ectopic pregnancies occur in one of the tubes where eggs travel from the ovary to the uterus (fallopian tubes), but the implanting can occur in other locations. In rare cases, ectopic pregnancies occur on the ovary, intestine, pelvis, abdomen, or cervix. In an ectopic pregnancy, the fertilized egg does not have the ability to develop into a normal, healthy baby. A ruptured ectopic pregnancy is one in which tearing or bursting of a fallopian tube causes internal bleeding. Often, there is intense lower abdominal pain, and vaginal bleeding sometimes occurs. Having an ectopic pregnancy can be life-threatening. If this dangerous condition is not treated, it can lead to blood loss, shock, or even death. What are the causes? The most common cause of this condition is damage to one of the fallopian tubes. A fallopian tube may be narrowed or blocked, and that keeps the fertilized egg from reaching the uterus. What increases the risk? This condition is more likely to develop in women of childbearing age who have different levels of risk. The levels of risk can be divided into three categories. High risk  You have gone through infertility treatment.  You have had an ectopic pregnancy before.  You have had surgery on the fallopian tubes, or another surgical procedure, such as an abortion.  You have had surgery to have the fallopian tubes tied (tubal ligation).  You have problems or diseases of the fallopian tubes.  You have been exposed to diethylstilbestrol (DES). This medicine was used until 1971, and it had effects on babies whose mothers took the medicine.  You become pregnant while using an IUD (intrauterine device) for birth control. Moderate risk  You have a history of infertility.  You have had an STI (sexually transmitted infection).  You have a history of pelvic inflammatory  disease (PID).  You have scarring from endometriosis.  You have multiple sexual partners.  You smoke. Low risk  You have had pelvic surgery.  You use vaginal douches.  You became sexually active before age 18. What are the signs or symptoms? Common symptoms of this condition include normal pregnancy symptoms, such as missing a period, nausea, tiredness, abdominal pain, breast tenderness, and bleeding. However, ectopic pregnancy will have additional symptoms, such as:  Pain with intercourse.  Irregular vaginal bleeding or spotting.  Cramping or pain on one side or in the lower abdomen.  Fast heartbeat, low blood pressure, and sweating.  Passing out while having a bowel movement.  Symptoms of a ruptured ectopic pregnancy and internal bleeding may include:  Sudden, severe pain in the abdomen and pelvis.  Dizziness, weakness, light-headedness, or fainting.  Pain in the shoulder or neck area.  How is this diagnosed? This condition is diagnosed by:  A pelvic exam to locate pain or a mass in the abdomen.  A pregnancy test. This blood test checks for the presence as well as the specific level of pregnancy hormone in the bloodstream.  Ultrasound. This is performed if a pregnancy test is positive. In this test, a probe is inserted into the vagina. The probe will detect a fetus, possibly in a location other than the uterus.  Taking a sample of uterus tissue (dilation and curettage, or D&C).  Surgery to perform a visual exam of the inside of the abdomen using a thin, lighted tube that has a tiny camera on the end (laparoscope).  Culdocentesis. This procedure involves inserting a needle at the top   of the vagina, behind the uterus. If blood is present in this area, it may indicate that a fallopian tube is torn.  How is this treated? This condition is treated with medicine or surgery. Medicine  An injection of a medicine (methotrexate) may be given to cause the pregnancy tissue  to be absorbed. This medicine may save your fallopian tube. It may be given if: ? The diagnosis is made early, with no signs of active bleeding. ? The fallopian tube has not ruptured. ? You are considered to be a good candidate for the medicine. Usually, pregnancy hormone blood levels are checked after methotrexate treatment. This is to be sure that the medicine is effective. It may take 4-6 weeks for the pregnancy to be absorbed. Most pregnancies will be absorbed by 3 weeks. Surgery  A laparoscope may be used to remove the pregnancy tissue.  If severe internal bleeding occurs, a larger cut (incision) may be made in the lower abdomen (laparotomy) to remove the fetus and placenta. This is done to stop the bleeding.  Part or all of the fallopian tube may be removed (salpingectomy) along with the fetus and placenta. The fallopian tube may also be repaired during the surgery.  In very rare circumstances, removal of the uterus (hysterectomy) may be required.  After surgery, pregnancy hormone testing may be done to be sure that there is no pregnancy tissue left. Whether your treatment is medicine or surgery, you may receive a Rho (D) immune globulin shot to prevent problems with any future pregnancy. This shot may be given if:  You are Rh-negative and the baby's father is Rh-positive.  You are Rh-negative and you do not know the Rh type of the baby's father.  Follow these instructions at home:  Rest and limit your activity after the procedure for as long as told by your health care provider.  Until your health care provider says that it is safe: ? Do not lift anything that is heavier than 10 lb (4.5 kg), or the limit that your health care provider tells you. ? Avoid physical exercise and any movement that requires effort (is strenuous).  To help prevent constipation: ? Eat a healthy diet that includes fruits, vegetables, and whole grains. ? Drink 6-8 glasses of water per day. Get help  right away if:  You develop worsening pain that is not relieved by medicine.  You have: ? A fever or chills. ? Vaginal bleeding. ? Redness and swelling at the incision site. ? Nausea and vomiting.  You feel dizzy or weak.  You feel light-headed or you faint. This information is not intended to replace advice given to you by your health care provider. Make sure you discuss any questions you have with your health care provider. Document Released: 06/24/2004 Document Revised: 01/14/2016 Document Reviewed: 12/17/2015 Elsevier Interactive Patient Education  2018 Elsevier Inc.  

## 2018-02-06 LAB — GC/CHLAMYDIA PROBE AMP (~~LOC~~) NOT AT ARMC
Chlamydia: NEGATIVE
Neisseria Gonorrhea: NEGATIVE

## 2018-02-07 ENCOUNTER — Ambulatory Visit: Payer: Medicaid Other

## 2018-02-07 DIAGNOSIS — O3680X0 Pregnancy with inconclusive fetal viability, not applicable or unspecified: Secondary | ICD-10-CM

## 2018-02-07 LAB — HCG, QUANTITATIVE, PREGNANCY: HCG, BETA CHAIN, QUANT, S: 11257 m[IU]/mL — AB (ref <5)

## 2018-02-07 NOTE — Progress Notes (Signed)
Pt here today for STAT Beta Lab.  Pt denies any vaginal bleeding however is continuing to have lower abdominal pain.  Pt advised that she will be here for two hours for duration of test and depending on results will determine her follow up.  Pt stated understanding with no further questions.  Notified Adrian Prows, CNM pt beta results.  Provider recommendation- appropriate rise in beta level and have a f/u OB US in 10 days.  Notified pt of provider's recommendation.  OB US scheduled for 02/16/18 @ 0800.  Pt agreed to provider's recommendation.

## 2018-02-16 ENCOUNTER — Encounter (HOSPITAL_COMMUNITY): Payer: Self-pay

## 2018-02-16 ENCOUNTER — Ambulatory Visit (HOSPITAL_COMMUNITY)
Admission: RE | Admit: 2018-02-16 | Discharge: 2018-02-16 | Disposition: A | Discharge status: Home / Self Care | Payer: Medicaid Other | Source: Ambulatory Visit | Attending: Student | Admitting: Student

## 2018-02-16 DIAGNOSIS — Z3A01 Less than 8 weeks gestation of pregnancy: Secondary | ICD-10-CM | POA: Diagnosis not present

## 2018-02-16 DIAGNOSIS — O3680X0 Pregnancy with inconclusive fetal viability, not applicable or unspecified: Secondary | ICD-10-CM

## 2018-03-24 ENCOUNTER — Other Ambulatory Visit: Payer: Self-pay

## 2018-03-24 ENCOUNTER — Encounter (HOSPITAL_COMMUNITY): Payer: Self-pay | Admitting: *Deleted

## 2018-03-24 ENCOUNTER — Inpatient Hospital Stay (HOSPITAL_COMMUNITY)
Admission: AD | Admit: 2018-03-24 | Discharge: 2018-03-24 | Disposition: A | Discharge status: Home / Self Care | Payer: Medicaid Other | Source: Ambulatory Visit | Attending: Obstetrics and Gynecology | Admitting: Obstetrics and Gynecology

## 2018-03-24 DIAGNOSIS — R109 Unspecified abdominal pain: Secondary | ICD-10-CM | POA: Insufficient documentation

## 2018-03-24 DIAGNOSIS — Z87891 Personal history of nicotine dependence: Secondary | ICD-10-CM | POA: Diagnosis not present

## 2018-03-24 DIAGNOSIS — Z3A11 11 weeks gestation of pregnancy: Secondary | ICD-10-CM | POA: Diagnosis not present

## 2018-03-24 DIAGNOSIS — N898 Other specified noninflammatory disorders of vagina: Secondary | ICD-10-CM | POA: Insufficient documentation

## 2018-03-24 DIAGNOSIS — Z3491 Encounter for supervision of normal pregnancy, unspecified, first trimester: Secondary | ICD-10-CM

## 2018-03-24 DIAGNOSIS — O26891 Other specified pregnancy related conditions, first trimester: Secondary | ICD-10-CM | POA: Diagnosis not present

## 2018-03-24 HISTORY — DX: Anemia, unspecified: D64.9

## 2018-03-24 HISTORY — DX: Unspecified ovarian cyst, unspecified side: N83.209

## 2018-03-24 LAB — URINALYSIS, ROUTINE W REFLEX MICROSCOPIC
Bilirubin Urine: NEGATIVE
GLUCOSE, UA: NEGATIVE mg/dL
Hgb urine dipstick: NEGATIVE
KETONES UR: NEGATIVE mg/dL
Leukocytes, UA: NEGATIVE
NITRITE: NEGATIVE
PROTEIN: NEGATIVE mg/dL
Specific Gravity, Urine: 1.009 (ref 1.005–1.030)
pH: 6 (ref 5.0–8.0)

## 2018-03-24 LAB — WET PREP, GENITAL
Clue Cells Wet Prep HPF POC: NONE SEEN
SPERM: NONE SEEN
Trich, Wet Prep: NONE SEEN
YEAST WET PREP: NONE SEEN

## 2018-03-24 NOTE — MAU Provider Note (Signed)
Chief Complaint: Vaginal Bleeding and Abdominal Pain   First Provider Initiated Contact with Patient 03/24/18 1744     SUBJECTIVE HPI: Shirley Baker is a 27 y.o. G3P2002 at [redacted]w[redacted]d who presents to Maternity Admissions reporting abdominal pain & pink spotting. Pain mainly in lower abdomen, R>L. Pain for the last week. Had pink spotting on toilet paper intermittently. Denies n/v/d, constipation, dysuria. Starts prenatal care at [facility] next week.   Location: lower abdomen Quality: sharp, cramping Severity: 7/10 on pain scale Duration: 1 week Timing: intermittent Modifying factors: nothing makes better or worse. Has not treated pain.  Associated signs and symptoms: pink spotting  Past Medical History:  Diagnosis Date  . Anemia   . Gestational diabetes    with 2nd preg  . Heart murmur   . Ovarian cyst   . Syncope    OB History  Gravida Para Term Preterm AB Living  3 2 2     2   SAB TAB Ectopic Multiple Live Births        0 2    # Outcome Date GA Lbr Len/2nd Weight Sex Delivery Anes PTL Lv  3 Current           2 Term 11/10/15 [redacted]w[redacted]d 25:23 / 00:03 2265 g F Vag-Spont None  LIV  1 Term      Vag-Spont      Past Surgical History:  Procedure Laterality Date  . NO PAST SURGERIES     Social History   Socioeconomic History  . Marital status: Single    Spouse name: Not on file  . Number of children: Not on file  . Years of education: Not on file  . Highest education level: Not on file  Occupational History  . Not on file  Social Needs  . Financial resource strain: Not on file  . Food insecurity:    Worry: Not on file    Inability: Not on file  . Transportation needs:    Medical: Not on file    Non-medical: Not on file  Tobacco Use  . Smoking status: Former Games developer  . Smokeless tobacco: Never Used  . Tobacco comment: hookah, last Aug 2019  Substance and Sexual Activity  . Alcohol use: No  . Drug use: No  . Sexual activity: Yes    Birth control/protection: None  Lifestyle  .  Physical activity:    Days per week: Not on file    Minutes per session: Not on file  . Stress: Not on file  Relationships  . Social connections:    Talks on phone: Not on file    Gets together: Not on file    Attends religious service: Not on file    Active member of club or organization: Not on file    Attends meetings of clubs or organizations: Not on file    Relationship status: Not on file  . Intimate partner violence:    Fear of current or ex partner: Not on file    Emotionally abused: Not on file    Physically abused: Not on file    Forced sexual activity: Not on file  Other Topics Concern  . Not on file  Social History Narrative  . Not on file   Family History  Problem Relation Age of Onset  . Healthy Mother   . Healthy Father   . Hearing loss Neg Hx    No current facility-administered medications on file prior to encounter.    Current Outpatient Medications on File Prior  to Encounter  Medication Sig Dispense Refill  . acetaminophen (TYLENOL) 325 MG tablet Take 2 tablets (650 mg total) by mouth every 4 (four) hours as needed (for pain scale < 4). 30 tablet 0  . hydrocortisone cream 1 % Apply to affected area 2 times daily 15 g 0  . nystatin-triamcinolone (MYCOLOG II) cream Apply to affected area daily 15 g 0  . Prenat w/o A Vit-FeFum-FePo-FA (CONCEPT OB) 130-92.4-1 MG CAPS Take 1 tablet by mouth daily. 30 capsule 12   No Known Allergies  I have reviewed patient's Past Medical Hx, Surgical Hx, Family Hx, Social Hx, medications and allergies.   Review of Systems  Constitutional: Negative.   Gastrointestinal: Positive for abdominal pain. Negative for constipation, diarrhea, nausea and vomiting.  Genitourinary: Positive for vaginal bleeding. Negative for dysuria and vaginal discharge.    OBJECTIVE Patient Vitals for the past 24 hrs:  BP Temp Temp src Pulse Resp SpO2 Weight  03/24/18 1831 107/62 - - - - - -  03/24/18 1708 124/70 98.9 F (37.2 C) Oral 84 - 100 %  54.8 kg   Constitutional: Well-developed, well-nourished female in no acute distress.  Cardiovascular: normal rate & rhythm, no murmur Respiratory: normal rate and effort. Lung sounds clear throughout GI: Abd soft, non-tender, Pos BS x 4. No guarding or rebound tenderness MS: Extremities nontender, no edema, normal ROM Neurologic: Alert and oriented x 4.  GU:     SPECULUM EXAM: NEFG, physiologic discharge, ectropian at cervix. Friable.   BIMANUAL: No CMT. cervix closed; uterus normal size, no adnexal tenderness or masses.    LAB RESULTS Results for orders placed or performed during the hospital encounter of 03/24/18 (from the past 24 hour(s))  Wet prep, genital     Status: Abnormal   Collection Time: 03/24/18  5:55 PM  Result Value Ref Range   Yeast Wet Prep HPF POC NONE SEEN NONE SEEN   Trich, Wet Prep NONE SEEN NONE SEEN   Clue Cells Wet Prep HPF POC NONE SEEN NONE SEEN   WBC, Wet Prep HPF POC MODERATE (A) NONE SEEN   Sperm NONE SEEN   Urinalysis, Routine w reflex microscopic     Status: Abnormal   Collection Time: 03/24/18  6:05 PM  Result Value Ref Range   Color, Urine STRAW (A) YELLOW   APPearance CLEAR CLEAR   Specific Gravity, Urine 1.009 1.005 - 1.030   pH 6.0 5.0 - 8.0   Glucose, UA NEGATIVE NEGATIVE mg/dL   Hgb urine dipstick NEGATIVE NEGATIVE   Bilirubin Urine NEGATIVE NEGATIVE   Ketones, ur NEGATIVE NEGATIVE mg/dL   Protein, ur NEGATIVE NEGATIVE mg/dL   Nitrite NEGATIVE NEGATIVE   Leukocytes, UA NEGATIVE NEGATIVE    IMAGING No results found.  MAU COURSE Orders Placed This Encounter  Procedures  . Wet prep, genital  . Urinalysis, Routine w reflex microscopic  . Discharge patient   No orders of the defined types were placed in this encounter.   MDM FHT present by doppler Cervix closed RH positive No blood on exam VSS, NAD ASSESSMENT 1. Abdominal pain during pregnancy in first trimester   2. [redacted] weeks gestation of pregnancy   3. Fetal heart tones  present, first trimester     PLAN Discharge home in stable condition. SAB precautions Follow-up Information    Department, [facility] Follow up.   Contact information: [address] [place] [address] [phone]          Allergies as of 03/24/2018  No Known Allergies     Medication List    TAKE these medications   acetaminophen 325 MG tablet Commonly known as:  TYLENOL Take 2 tablets (650 mg total) by mouth every 4 (four) hours as needed (for pain scale < 4).   CONCEPT OB 130-92.4-1 MG Caps Take 1 tablet by mouth daily.   hydrocortisone cream 1 % Apply to affected area 2 times daily   nystatin-triamcinolone cream Commonly known as:  MYCOLOG II Apply to affected area daily        Judeth Horn, NP 03/24/2018  11:15 PM

## 2018-03-24 NOTE — MAU Note (Signed)
Having abd in RLQ, same as before. Mostly constant. Pink when wipes on tissue.

## 2018-03-24 NOTE — Discharge Instructions (Signed)

## 2018-03-28 LAB — GC/CHLAMYDIA PROBE AMP (~~LOC~~) NOT AT ARMC
Chlamydia: NEGATIVE
Neisseria Gonorrhea: NEGATIVE

## 2018-04-03 ENCOUNTER — Other Ambulatory Visit (HOSPITAL_COMMUNITY): Payer: Self-pay | Admitting: Nurse Practitioner

## 2018-04-03 DIAGNOSIS — Z0389 Encounter for observation for other suspected diseases and conditions ruled out: Secondary | ICD-10-CM | POA: Diagnosis not present

## 2018-04-03 DIAGNOSIS — O209 Hemorrhage in early pregnancy, unspecified: Secondary | ICD-10-CM | POA: Diagnosis not present

## 2018-04-03 DIAGNOSIS — Z3481 Encounter for supervision of other normal pregnancy, first trimester: Secondary | ICD-10-CM | POA: Diagnosis not present

## 2018-04-03 DIAGNOSIS — O99019 Anemia complicating pregnancy, unspecified trimester: Secondary | ICD-10-CM | POA: Diagnosis not present

## 2018-04-03 DIAGNOSIS — R05 Cough: Secondary | ICD-10-CM | POA: Diagnosis not present

## 2018-04-03 DIAGNOSIS — Z3009 Encounter for other general counseling and advice on contraception: Secondary | ICD-10-CM | POA: Diagnosis not present

## 2018-04-03 DIAGNOSIS — Z23 Encounter for immunization: Secondary | ICD-10-CM | POA: Diagnosis not present

## 2018-04-03 DIAGNOSIS — N644 Mastodynia: Secondary | ICD-10-CM | POA: Diagnosis not present

## 2018-04-03 DIAGNOSIS — Z3A13 13 weeks gestation of pregnancy: Secondary | ICD-10-CM

## 2018-04-03 DIAGNOSIS — Z8759 Personal history of other complications of pregnancy, childbirth and the puerperium: Secondary | ICD-10-CM | POA: Diagnosis not present

## 2018-04-03 DIAGNOSIS — Z1388 Encounter for screening for disorder due to exposure to contaminants: Secondary | ICD-10-CM | POA: Diagnosis not present

## 2018-04-03 DIAGNOSIS — Z369 Encounter for antenatal screening, unspecified: Secondary | ICD-10-CM

## 2018-04-03 LAB — OB RESULTS CONSOLE HGB/HCT, BLOOD
HEMATOCRIT: 38 (ref 36–46)
HEMOGLOBIN: 12.3 (ref 12.0–16.0)

## 2018-04-03 LAB — OB RESULTS CONSOLE PLATELET COUNT: Platelets: 249 (ref 150–399)

## 2018-04-03 LAB — DRUG SCREEN, URINE: Drug Screen, Urine: NEGATIVE

## 2018-04-03 LAB — OB RESULTS CONSOLE ABO/RH: RH TYPE: POSITIVE

## 2018-04-03 LAB — OB RESULTS CONSOLE ANTIBODY SCREEN: Antibody Screen: NEGATIVE

## 2018-04-03 LAB — CYTOLOGY - PAP: Pap: NEGATIVE

## 2018-04-03 LAB — OB RESULTS CONSOLE GC/CHLAMYDIA
Chlamydia: NEGATIVE
GC PROBE AMP, GENITAL: NEGATIVE

## 2018-04-03 LAB — OB RESULTS CONSOLE RPR: RPR: NONREACTIVE

## 2018-04-03 LAB — GLUCOSE, 1 HOUR: GLUCOSE 1 HOUR: 113

## 2018-04-03 LAB — OB RESULTS CONSOLE HIV ANTIBODY (ROUTINE TESTING): HIV: NONREACTIVE

## 2018-04-03 LAB — OB RESULTS CONSOLE HEPATITIS B SURFACE ANTIGEN: Hepatitis B Surface Ag: NEGATIVE

## 2018-04-03 LAB — CULTURE, OB URINE: Urine Culture, OB: NO GROWTH

## 2018-04-03 LAB — OB RESULTS CONSOLE RUBELLA ANTIBODY, IGM: Rubella: IMMUNE

## 2018-04-04 ENCOUNTER — Other Ambulatory Visit: Payer: Self-pay | Admitting: Family

## 2018-04-04 DIAGNOSIS — N644 Mastodynia: Secondary | ICD-10-CM

## 2018-04-05 ENCOUNTER — Ambulatory Visit (HOSPITAL_COMMUNITY)
Admission: RE | Admit: 2018-04-05 | Discharge: 2018-04-05 | Disposition: A | Discharge status: Home / Self Care | Payer: Medicaid Other | Source: Ambulatory Visit | Attending: Nurse Practitioner | Admitting: Nurse Practitioner

## 2018-04-05 ENCOUNTER — Encounter (HOSPITAL_COMMUNITY): Payer: Self-pay

## 2018-04-05 DIAGNOSIS — Z3A13 13 weeks gestation of pregnancy: Secondary | ICD-10-CM | POA: Insufficient documentation

## 2018-04-05 DIAGNOSIS — Z3682 Encounter for antenatal screening for nuchal translucency: Secondary | ICD-10-CM | POA: Insufficient documentation

## 2018-04-05 DIAGNOSIS — Z369 Encounter for antenatal screening, unspecified: Secondary | ICD-10-CM

## 2018-04-11 ENCOUNTER — Ambulatory Visit (INDEPENDENT_AMBULATORY_CARE_PROVIDER_SITE_OTHER): Payer: Medicaid Other | Admitting: Family Medicine

## 2018-04-11 VITALS — BP 103/63 | HR 89 | Wt 121.4 lb

## 2018-04-11 DIAGNOSIS — O0990 Supervision of high risk pregnancy, unspecified, unspecified trimester: Secondary | ICD-10-CM

## 2018-04-11 NOTE — Progress Notes (Signed)
[facility] to previous SGA @ 38.1 wks . She emphatically denies Hx of GDM. Prenatal labs performed @ [facility] on 11/6 - results have not been received. Pt has had Nuchal translucency and First screen on 11/6. FHR obtained today with bedside US per PW doppler.

## 2018-04-12 NOTE — Progress Notes (Signed)
Patient seen and assessed by nursing staff.  Agree with documentation and plan.  

## 2018-04-13 ENCOUNTER — Ambulatory Visit
Admission: RE | Admit: 2018-04-13 | Discharge: 2018-04-13 | Disposition: A | Discharge status: Home / Self Care | Payer: Medicaid Other | Source: Ambulatory Visit | Attending: Family | Admitting: Family

## 2018-04-13 DIAGNOSIS — N644 Mastodynia: Secondary | ICD-10-CM

## 2018-04-14 ENCOUNTER — Encounter: Payer: Self-pay | Admitting: Family Medicine

## 2018-04-14 ENCOUNTER — Ambulatory Visit (INDEPENDENT_AMBULATORY_CARE_PROVIDER_SITE_OTHER): Payer: Medicaid Other | Admitting: Family Medicine

## 2018-04-14 ENCOUNTER — Ambulatory Visit: Payer: Medicaid Other | Admitting: Clinical

## 2018-04-14 VITALS — BP 112/67 | HR 92 | Wt 119.6 lb

## 2018-04-14 DIAGNOSIS — D573 Sickle-cell trait: Secondary | ICD-10-CM | POA: Insufficient documentation

## 2018-04-14 DIAGNOSIS — O0990 Supervision of high risk pregnancy, unspecified, unspecified trimester: Secondary | ICD-10-CM

## 2018-04-14 DIAGNOSIS — Z87898 Personal history of other specified conditions: Secondary | ICD-10-CM | POA: Insufficient documentation

## 2018-04-14 DIAGNOSIS — O219 Vomiting of pregnancy, unspecified: Secondary | ICD-10-CM | POA: Insufficient documentation

## 2018-04-14 DIAGNOSIS — O0991 Supervision of high risk pregnancy, unspecified, first trimester: Secondary | ICD-10-CM

## 2018-04-14 LAB — POCT URINALYSIS DIP (DEVICE)
Bilirubin Urine: NEGATIVE
GLUCOSE, UA: NEGATIVE mg/dL
Hgb urine dipstick: NEGATIVE
Ketones, ur: NEGATIVE mg/dL
Leukocytes, UA: NEGATIVE
Nitrite: NEGATIVE
PH: 7.5 (ref 5.0–8.0)
PROTEIN: NEGATIVE mg/dL
Specific Gravity, Urine: 1.015 (ref 1.005–1.030)
UROBILINOGEN UA: 0.2 mg/dL (ref 0.0–1.0)

## 2018-04-14 MED ORDER — CONCEPT OB 130-92.4-1 MG PO CAPS: 1.0000 | ORAL_CAPSULE | Freq: Every day | ORAL | 11 refills | Status: DC

## 2018-04-14 MED ORDER — DOXYLAMINE-PYRIDOXINE 10-10 MG PO TBEC: 1.0000 | DELAYED_RELEASE_TABLET | Freq: Three times a day (TID) | ORAL | 3 refills | Status: DC

## 2018-04-14 NOTE — Patient Instructions (Signed)

## 2018-04-14 NOTE — Progress Notes (Signed)
   PRENATAL VISIT NOTE  Subjective:  Shirley Baker is a 27 y.o. G3P2002 at 636w4d being seen today for [facility] due to prior LBW infant at term. 2nd pregnancy resulted in 4 lb 15 ounce baby at 38 1/7 wks.  She is currently monitored for the following issues for this high-risk pregnancy and has Supervision of high risk pregnancy, antepartum; Sickle cell trait (HCC); History of low birth weight; and Nausea and vomiting in pregnancy prior to [redacted] weeks gestation on their problem list.  Patient reports nausea and vomiting.  Contractions: Not present. Vag. Bleeding: None.  Movement: Absent. Denies leaking of fluid.   The following portions of the patient's history were reviewed and updated as appropriate: allergies, current medications, past family history, past medical history, past social history, past surgical history and problem list. Problem list updated.  Objective:   Vitals:   04/14/18 0850  BP: 112/67  Pulse: 92  Weight: 119 lb 9.6 oz (54.3 kg)    Fetal Status: Fetal Heart Rate (bpm): 144   Movement: Absent     General:  Alert, oriented and cooperative. Patient is in no acute distress.  Skin: Skin is warm and dry. No rash noted.   Cardiovascular: Normal heart rate noted  Respiratory: Normal respiratory effort, no problems with respiration noted  Abdomen: Soft, gravid, appropriate for gestational age.  Pain/Pressure: Present     Pelvic: Cervical exam deferred        Extremities: Normal range of motion.  Edema: None  Mental Status: Normal mood and affect. Normal behavior. Normal judgment and thought content.   Assessment and Plan:  Pregnancy: G3P2002 at 1036w4d  1. Supervision of high risk pregnancy, antepartum Anatomy scheduled Need OB labs--called [facility] for update--they will print and fax today. Genetics screen is normal. - US MFM OB DETAIL +14 WK; Future - Prenat w/o A Vit-FeFum-FePo-FA (CONCEPT OB) 130-92.4-1 MG CAPS; Take 1 capsule by mouth daily.   Dispense: 30 capsule; Refill: 11  2. Sickle cell trait (HCC) FOB is negative for SCT  3. History of low birth weight Serial u/s for growth  4. Nausea and vomiting in pregnancy prior to [redacted] weeks gestation Trial of diclegis - Doxylamine-Pyridoxine (DICLEGIS) 10-10 MG TBEC; Take 1 tablet by mouth 3 (three) times daily. Take 2 at hs, 1 q am, 1 at midday if needed  Dispense: 100 tablet; Refill: 3  General obstetric precautions including but not limited to vaginal bleeding, contractions, leaking of fluid and fetal movement were reviewed in detail with the patient. Please refer to After Visit Summary for other counseling recommendations.  Return in 4 weeks (on 05/12/2018).  Future Appointments  Date Time Provider Department Center  05/12/2018 10:35 AM Allie Bossierove, Myra C, MD [facility]  05/15/2018  8:45 AM [facility] US 5 [facility]    Reva Boresanya S Dezaree Tracey, MD

## 2018-04-14 NOTE — BH Specialist Note (Signed)
Integrated Behavioral Health Initial Visit  MRN: [id number] Name: Shirley Baker  Number of Integrated Behavioral Health Clinician visits:: 1/6 Session Start time: 9:29  Session End time: 9:36 Total time: 15 minutes  Type of Service: Integrated Behavioral Health- Individual/Family Interpretor:No. Interpretor Name and Language: n/a   Warm Hand Off Completed.       SUBJECTIVE: Shirley Baker is a 27 y.o. female accompanied by n/a Patient was referred by Tinnie Gensanya Pratt, MD for Initial OB introduction to integrated behavioral health services . Patient reports the following symptoms/concerns: Pt states no particular concerns today.  Duration of problem: n/a; Severity of problem: n/a  OBJECTIVE: Mood: Normal and Affect: Appropriate Risk of harm to self or others: No plan to harm self or others  LIFE CONTEXT: Family and Social: Pt has two children(7yo;2yo) School/Work: - Self-Care: - Life Changes: Current pregnancy   GOALS ADDRESSED: Patient will: 1. Increase knowledge and/or ability of: healthy habits   INTERVENTIONS:  Standardized Assessments completed: GAD-7 and PHQ 9  ASSESSMENT: atient currently experiencing Supervision of high risk pregnancy, antepartum.   Patient may benefit from Initial OB introduction to integrated behavioral health services .  PLAN: 1. Follow up with behavioral health clinician on : As needed  2. Behavioral recommendations:  -Continue taking prenatal vitamin, as recommended by medical provider  3. Referral(s): Integrated Behavioral Health Services (In Clinic) 4. "From scale of 1-10, how likely are you to follow plan?": 10  Rae LipsJamie C Shakesha Soltau, LCSW  Dep[facility] screen [facility] 2/[facility] 04/14/2018 05/16/2015 12/30/2014 12/26/2014 11/20/2014  Decreased Interest 0 0 0 0 0  Down, Depressed, Hopeless 0 0 0 0 0  PHQ - 2 Score 0 0 0 0 0  Altered sleeping 0 - - - -  Tired, decreased energy 0 - - - -  Change in appetite 2 - - - -  Feeling bad or failure about yourself   0 - - - -  Trouble concentrating 0 - - - -  Moving slowly or fidgety/restless 0 - - - -  Suicidal thoughts 0 - - - -  PHQ-9 Score 2 - - - -   GAD 7 : Generalized Anxiety Score 04/14/2018  Nervous, Anxious, on Edge 0  Control/stop worrying 0  Worry too much - different things 0  Trouble relaxing 0  Restless 0  Easily annoyed or irritable 0  Afraid - awful might happen 0  Total GAD 7 Score 0

## 2018-04-17 ENCOUNTER — Other Ambulatory Visit (HOSPITAL_COMMUNITY): Payer: Self-pay

## 2018-04-19 ENCOUNTER — Encounter: Payer: Self-pay | Admitting: *Deleted

## 2018-04-26 ENCOUNTER — Other Ambulatory Visit: Payer: Self-pay

## 2018-04-26 NOTE — Telephone Encounter (Signed)
Received fax for prior authorization for pt Diclegis medication. Prior approval request form faxed.

## 2018-05-08 ENCOUNTER — Encounter (HOSPITAL_COMMUNITY): Payer: Self-pay

## 2018-05-12 ENCOUNTER — Telehealth: Payer: Self-pay | Admitting: Family Medicine

## 2018-05-12 ENCOUNTER — Encounter: Payer: Self-pay | Admitting: Student

## 2018-05-12 NOTE — Telephone Encounter (Signed)
Called patient and LVM with new ob appt on 06/01/2018 @ 9:15. Left office number and hours if she needed to reschedule.

## 2018-05-15 ENCOUNTER — Ambulatory Visit (HOSPITAL_COMMUNITY)
Admission: RE | Admit: 2018-05-15 | Discharge: 2018-05-15 | Disposition: A | Discharge status: Home / Self Care | Payer: Medicaid Other | Source: Ambulatory Visit | Attending: Family Medicine | Admitting: Family Medicine

## 2018-05-15 ENCOUNTER — Encounter (HOSPITAL_COMMUNITY): Payer: Self-pay

## 2018-05-15 ENCOUNTER — Other Ambulatory Visit (HOSPITAL_COMMUNITY): Payer: Self-pay | Admitting: *Deleted

## 2018-05-15 DIAGNOSIS — O09292 Supervision of pregnancy with other poor reproductive or obstetric history, second trimester: Secondary | ICD-10-CM | POA: Diagnosis not present

## 2018-05-15 DIAGNOSIS — Z3A19 19 weeks gestation of pregnancy: Secondary | ICD-10-CM | POA: Insufficient documentation

## 2018-05-15 DIAGNOSIS — Z363 Encounter for antenatal screening for malformations: Secondary | ICD-10-CM

## 2018-05-15 DIAGNOSIS — Z862 Personal history of diseases of the blood and blood-forming organs and certain disorders involving the immune mechanism: Secondary | ICD-10-CM | POA: Diagnosis not present

## 2018-05-15 DIAGNOSIS — Z8759 Personal history of other complications of pregnancy, childbirth and the puerperium: Secondary | ICD-10-CM

## 2018-05-15 DIAGNOSIS — O0990 Supervision of high risk pregnancy, unspecified, unspecified trimester: Secondary | ICD-10-CM

## 2018-05-15 MED ORDER — VITAMIN B-6 50 MG PO TABS: 50.0000 mg | ORAL_TABLET | Freq: Three times a day (TID) | ORAL | 1 refills | Status: DC

## 2018-05-15 MED ORDER — DOXYLAMINE SUCCINATE (SLEEP) 25 MG PO TABS: 25.0000 mg | ORAL_TABLET | Freq: Three times a day (TID) | ORAL | 1 refills | Status: DC

## 2018-05-30 ENCOUNTER — Inpatient Hospital Stay (HOSPITAL_COMMUNITY)
Admission: AD | Admit: 2018-05-30 | Discharge: 2018-05-30 | Disposition: A | Discharge status: Home / Self Care | Payer: Medicaid Other | Source: Ambulatory Visit | Attending: Obstetrics & Gynecology | Admitting: Obstetrics & Gynecology

## 2018-05-30 DIAGNOSIS — J029 Acute pharyngitis, unspecified: Secondary | ICD-10-CM | POA: Insufficient documentation

## 2018-05-30 DIAGNOSIS — Z3A21 21 weeks gestation of pregnancy: Secondary | ICD-10-CM

## 2018-05-30 DIAGNOSIS — O0990 Supervision of high risk pregnancy, unspecified, unspecified trimester: Secondary | ICD-10-CM

## 2018-05-30 DIAGNOSIS — O98512 Other viral diseases complicating pregnancy, second trimester: Secondary | ICD-10-CM | POA: Insufficient documentation

## 2018-05-30 DIAGNOSIS — O26892 Other specified pregnancy related conditions, second trimester: Secondary | ICD-10-CM

## 2018-05-30 DIAGNOSIS — B349 Viral infection, unspecified: Secondary | ICD-10-CM | POA: Diagnosis not present

## 2018-05-30 DIAGNOSIS — Z87898 Personal history of other specified conditions: Secondary | ICD-10-CM

## 2018-05-30 DIAGNOSIS — O219 Vomiting of pregnancy, unspecified: Secondary | ICD-10-CM

## 2018-05-30 LAB — INFLUENZA PANEL BY PCR (TYPE A & B)
Influenza A By PCR: POSITIVE — AB
Influenza B By PCR: NEGATIVE

## 2018-05-30 LAB — GROUP A STREP BY PCR: Group A Strep by PCR: NOT DETECTED

## 2018-05-30 MED ORDER — PROMETHAZINE HCL 12.5 MG PO TABS: 12.5000 mg | ORAL_TABLET | Freq: Four times a day (QID) | ORAL | 0 refills | Status: DC | PRN

## 2018-05-30 MED ORDER — OSELTAMIVIR PHOSPHATE 75 MG PO CAPS: 75.0000 mg | ORAL_CAPSULE | Freq: Two times a day (BID) | ORAL | 0 refills | Status: DC

## 2018-05-30 MED ORDER — OXYCODONE-ACETAMINOPHEN 5-325 MG PO TABS: 1.0000 | ORAL_TABLET | Freq: Four times a day (QID) | ORAL | 0 refills | Status: DC | PRN

## 2018-05-30 NOTE — MAU Provider Note (Signed)
  History     CSN: [id number]  Arrival date and time: 05/30/18 1629    First Provider Initiated Contact with Patient 05/30/18 1949        Chief Complaint  Patient presents with  . Cough  . Sore Throat   HPI  [name] is a 27 y.o. G3P2002 at 5069w1d who presents to MAU today with complaint of sore throat, cough, N/V and chills for the last 1-2 days. She denies fever.   OB History    Gravida  3   Para  2   Term  2   Preterm      AB      Living  2     SAB      TAB      Ectopic      Multiple  0   Live Births  2           Past Medical History:  Diagnosis Date  . Anemia   . Heart murmur   . Ovarian cyst   . Syncope     Past Surgical History:  Procedure Laterality Date  . NO PAST SURGERIES      Family History  Problem Relation Age of Onset  . Healthy Mother   . Healthy Father   . Hearing loss Neg Hx     Social History   Tobacco Use  . Smoking status: Never Smoker  . Smokeless tobacco: Never Used  . Tobacco comment: hookah, last Aug 2019  Substance Use Topics  . Alcohol use: No  . Drug use: No    Allergies: No Known Allergies  No medications prior to admission.    Review of Systems  Constitutional: Positive for chills. Negative for fever.  HENT: Positive for congestion.   Respiratory: Positive for cough.   Gastrointestinal: Positive for nausea and vomiting. Negative for abdominal pain.  Genitourinary: Negative for dysuria, frequency, urgency, vaginal bleeding and vaginal discharge.   Physical Exam   Blood pressure 122/72, pulse (!) 116, temperature 99.4 F (37.4 C), resp. rate 18, height 5\' 1"  (1.549 m), weight 57.2 kg, last menstrual period 01/02/2018, currently breastfeeding.  Physical Exam  Nursing note and vitals reviewed. Constitutional: She is oriented to person, place, and time. She appears well-developed and well-nourished. No distress.  HENT:  Head: Normocephalic and atraumatic.  Cardiovascular: Normal rate,  regular rhythm and normal heart sounds.  No murmur heard. Respiratory: Effort normal and breath sounds normal. No respiratory distress. She has no wheezes.  GI: Soft. She exhibits no distension. There is no abdominal tenderness.  Neurological: She is alert and oriented to person, place, and time.  Skin: Skin is warm and dry. No erythema.  Psychiatric: She has a normal mood and affect.    MAU Course  Procedures None  MDM Rapid Flu and rapid strep test collected   Assessment and Plan  A:  Viral Illness   P:  Discharge home Rx for Tamiflu, percocet and phenergan given  Warning signs for worsening condition discussed Rapid flu and strep pending. Will call with results.  Patient advised to follow-up with [facility] as scheduled for routine prenatal care Patient may return to MAU as needed or if her condition were to change or worsen  Vonzella NippleJulie Dartha Rozzell, PA-C 05/30/2018, 8:48 PM

## 2018-05-30 NOTE — MAU Note (Signed)
Pt reports cough and sore throat that started lst night. having chills as well. C/o HA. Sputum is yellow. Reports some n/v and loss of appetite.

## 2018-05-30 NOTE — Discharge Instructions (Signed)
Food Choices for Nausea and Vomiting When you have diarrhea, the foods you eat and your eating habits are very important. Choosing the right foods and drinks can help:  Relieve diarrhea.  Replace lost fluids and nutrients.  Prevent dehydration. What general guidelines should I follow?  Relieving diarrhea  Choose foods with less than 2 g or .07 oz. of fiber per serving.  Limit fats to less than 8 tsp (38 g or 1.34 oz.) a day.  Avoid the following: ? Foods and beverages sweetened with high-fructose corn syrup, honey, or sugar alcohols such as xylitol, sorbitol, and mannitol. ? Foods that contain a lot of fat or sugar. ? Fried, greasy, or spicy foods. ? High-fiber grains, breads, and cereals. ? Raw fruits and vegetables.  Eat foods that are rich in probiotics. These foods include dairy products such as yogurt and fermented milk products. They help increase healthy bacteria in the stomach and intestines (gastrointestinal tract, or GI tract).  If you have lactose intolerance, avoid dairy products. These may make your diarrhea worse.  Take medicine to help stop diarrhea (antidiarrheal medicine) only as told by your health care provider. Replacing nutrients  Eat small meals or snacks every 3-4 hours.  Eat bland foods, such as white rice, toast, or baked potato, until your diarrhea starts to get better. Gradually reintroduce nutrient-rich foods as tolerated or as told by your health care provider. This includes: ? Well-cooked protein foods. ? Peeled, seeded, and soft-cooked fruits and vegetables. ? Low-fat dairy products.  Take vitamin and mineral supplements as told by your health care provider. Preventing dehydration  Start by sipping water or a special solution to prevent dehydration (oral rehydration solution, ORS). Urine that is clear or pale yellow means that you are getting enough fluid.  Try to drink at least 8-10 cups of fluid each day to help replace lost fluids.  You  may add other liquids in addition to water, such as clear juice or decaffeinated sports drinks, as tolerated or as told by your health care provider.  Avoid drinks with caffeine, such as coffee, tea, or soft drinks.  Avoid alcohol. What foods are recommended?     The items listed may not be a complete list. Talk with your health care provider about what dietary choices are best for you. Grains White rice. White, [place], or pita breads (fresh or toasted), including plain rolls, buns, or bagels. White pasta. Saltine, soda, or graham crackers. Pretzels. Low-fiber cereal. Cooked cereals made with water (such as cornmeal, farina, or cream cereals). Plain muffins. Matzo. Melba toast. Zwieback. Vegetables Potatoes (without the skin). Most well-cooked and canned vegetables without skins or seeds. Tender lettuce. Fruits Apple sauce. Fruits canned in juice. Cooked apricots, cherries, grapefruit, peaches, pears, or plums. Fresh bananas and cantaloupe. Meats and other protein foods Baked or boiled chicken. Eggs. Tofu. Fish. Seafood. Smooth nut butters. Ground or well-cooked tender beef, ham, veal, lamb, pork, or poultry. Dairy Plain yogurt, kefir, and unsweetened liquid yogurt. Lactose-free milk, buttermilk, skim milk, or soy milk. Low-fat or nonfat hard cheese. Beverages Water. Low-calorie sports drinks. Fruit juices without pulp. Strained tomato and vegetable juices. Decaffeinated teas. Sugar-free beverages not sweetened with sugar alcohols. Oral rehydration solutions, if approved by your health care provider. Seasoning and other foods Bouillon, broth, or soups made from recommended foods. What foods are not recommended? The items listed may not be a complete list. Talk with your health care provider about what dietary choices are best for you. Grains Whole grain,  whole wheat, bran, or rye breads, rolls, pastas, and crackers. Wild or brown rice. Whole grain or bran cereals. Barley. Oats and oatmeal.  Corn tortillas or taco shells. Granola. Popcorn. Vegetables Raw vegetables. Fried vegetables. Cabbage, broccoli, Brussels sprouts, artichokes, baked beans, beet greens, corn, kale, legumes, peas, sweet potatoes, and yams. Potato skins. Cooked spinach and cabbage. Fruits Dried fruit, including raisins and dates. Raw fruits. Stewed or dried prunes. Canned fruits with syrup. Meat and other protein foods Fried or fatty meats. Deli meats. Chunky nut butters. Nuts and seeds. Beans and lentils. Tomasa BlaseBacon. Hot dogs. Sausage. Dairy High-fat cheeses. Whole milk, chocolate milk, and beverages made with milk, such as milk shakes. Half-and-half. Cream. sour cream. Ice cream. Beverages Caffeinated beverages (such as coffee, tea, soda, or energy drinks). Alcoholic beverages. Fruit juices with pulp. Prune juice. Soft drinks sweetened with high-fructose corn syrup or sugar alcohols. High-calorie sports drinks. Fats and oils Butter. Cream sauces. Margarine. Salad oils. Plain salad dressings. Olives. Avocados. Mayonnaise. Sweets and desserts Sweet rolls, doughnuts, and sweet breads. Sugar-free desserts sweetened with sugar alcohols such as xylitol and sorbitol. Seasoning and other foods Honey. Hot sauce. Chili powder. Gravy. Cream-based or milk-based soups. Pancakes and waffles. Summary  When you have diarrhea, the foods you eat and your eating habits are very important.  Make sure you get at least 8-10 cups of fluid each day, or enough to keep your urine clear or pale yellow.  Eat bland foods and gradually reintroduce healthy, nutrient-rich foods as tolerated, or as told by your health care provider.  Avoid high-fiber, fried, greasy, or spicy foods. This information is not intended to replace advice given to you by your health care provider. Make sure you discuss any questions you have with your health care provider. Document Released: 08/07/2003 Document Revised: 05/14/2016 Document Reviewed:  05/14/2016 Elsevier Interactive Patient Education  2019 Elsevier Inc. Influenza, Adult Influenza is also called "the flu." It is an infection in the lungs, nose, and throat (respiratory tract). It is caused by a virus. The flu causes symptoms that are similar to symptoms of a cold. It also causes a high fever and body aches. The flu spreads easily from person to person (is contagious). Getting a flu shot (influenza vaccination) every year is the best way to prevent the flu. What are the causes? This condition is caused by the influenza virus. You can get the virus by:  Breathing in droplets that are in the air from the cough or sneeze of a person who has the virus.  Touching something that has the virus on it (is contaminated) and then touching your mouth, nose, or eyes. What increases the risk? Certain things may make you more likely to get the flu. These include:  Not washing your hands often.  Having close contact with many people during cold and flu season.  Touching your mouth, eyes, or nose without first washing your hands.  Not getting a flu shot every year. You may have a higher risk for the flu, along with serious problems such as a lung infection (pneumonia), if you:  Are older than 65.  Are pregnant.  Have a weakened disease-fighting system (immune system) because of a disease or taking certain medicines.  Have a long-term (chronic) illness, such as: ? Heart, kidney, or lung disease. ? Diabetes. ? Asthma.  Have a liver disorder.  Are very overweight (morbidly obese).  Have anemia. This is a condition that affects your red blood cells. What are the signs or symptoms?  Symptoms usually begin suddenly and last 4-14 days. They may include:  Fever and chills.  Headaches, body aches, or muscle aches.  Sore throat.  Cough.  Runny or stuffy (congested) nose.  Chest discomfort.  Not wanting to eat as much as normal (poor appetite).  Weakness or feeling tired  (fatigue).  Dizziness.  Feeling sick to your stomach (nauseous) or throwing up (vomiting). How is this treated? If the flu is found early, you can be treated with medicine that can help reduce how bad the illness is and how long it lasts (antiviral medicine). This may be given by mouth (orally) or through an IV tube. Taking care of yourself at home can help your symptoms get better. Your doctor may suggest:  Taking over-the-counter medicines.  Drinking plenty of fluids. The flu often goes away on its own. If you have very bad symptoms or other problems, you may be treated in a hospital. Follow these instructions at home:     Activity  Rest as needed. Get plenty of sleep.  Stay home from work or school as told by your doctor. ? Do not leave home until you do not have a fever for 24 hours without taking medicine. ? Leave home only to visit your doctor. Eating and drinking  Take an ORS (oral rehydration solution). This is a drink that is sold at pharmacies and stores.  Drink enough fluid to keep your pee (urine) pale yellow.  Drink clear fluids in small amounts as you are able. Clear fluids include: ? Water. ? Ice chips. ? Fruit juice that has water added (diluted fruit juice). ? Low-calorie sports drinks.  Eat bland, easy-to-digest foods in small amounts as you are able. These foods include: ? Bananas. ? Applesauce. ? Rice. ? Lean meats. ? Toast. ? Crackers.  Do not eat or drink: ? Fluids that have a lot of sugar or caffeine. ? Alcohol. ? Spicy or fatty foods. General instructions  Take over-the-counter and prescription medicines only as told by your doctor.  Use a cool mist humidifier to add moisture to the air in your home. This can make it easier for you to breathe.  Cover your mouth and nose when you cough or sneeze.  Wash your hands with soap and water often, especially after you cough or sneeze. If you cannot use soap and water, use alcohol-based hand  sanitizer.  Keep all follow-up visits as told by your doctor. This is important. How is this prevented?   Get a flu shot every year. You may get the flu shot in late summer, fall, or winter. Ask your doctor when you should get your flu shot.  Avoid contact with people who are sick during fall and winter (cold and flu season). Contact a doctor if:  You get new symptoms.  You have: ? Chest pain. ? Watery poop (diarrhea). ? A fever.  Your cough gets worse.  You start to have more mucus.  You feel sick to your stomach.  You throw up. Get help right away if you:  Have shortness of breath.  Have trouble breathing.  Have skin or nails that turn a bluish color.  Have very bad pain or stiffness in your neck.  Get a sudden headache.  Get sudden pain in your face or ear.  Cannot eat or drink without throwing up. Summary  Influenza ("the flu") is an infection in the lungs, nose, and throat. It is caused by a virus.  Take over-the-counter and prescription medicines only  as told by your doctor.  Getting a flu shot every year is the best way to avoid getting the flu. This information is not intended to replace advice given to you by your health care provider. Make sure you discuss any questions you have with your health care provider. Document Released: 02/24/2008 Document Revised: 11/02/2017 Document Reviewed: 11/02/2017 Elsevier Interactive Patient Education  2019 [employer]

## 2018-05-31 NOTE — L&D Delivery Note (Signed)
Delivery Note Pt received an epidural and became complete at 0120. She pushed well and at 1:25 AM a viable female was delivered via Vaginal, Spontaneous (Presentation: ROA).  APGAR: 6, 9; weight: 2860 gm (6lb 4.9oz).  Infant dried and placed on pt's abd; cord clamped and cut by FOB at 1 min of age; hospital cord blood sample collected. Placenta status: spont , intact.  Cord: 3 vessel  Anesthesia:  Epidural Episiotomy: None Lacerations: None Est. Blood Loss (mL): 118  Mom to postpartum.  Baby to Couplet care / Skin to Skin.  Arabella Merles CNM 10/07/2018, 1:39 AM  Please schedule this patient for Postpartum visit in: 4 weeks with the following provider: Any provider For C/S patients schedule nurse incision check in weeks 2 weeks: no Low risk pregnancy complicated by: none Delivery mode:  SVD Anticipated Birth Control:  IUD PP Procedures needed: none  Schedule Integrated BH visit: no

## 2018-06-01 ENCOUNTER — Encounter: Payer: Self-pay | Admitting: Obstetrics and Gynecology

## 2018-06-01 ENCOUNTER — Telehealth: Payer: Self-pay | Admitting: Family Medicine

## 2018-06-01 NOTE — Telephone Encounter (Signed)
Called patient, no answer left a voicemail. The appt. Is rescheduled and a reminder letter is being sent to the patient.

## 2018-06-06 ENCOUNTER — Ambulatory Visit (INDEPENDENT_AMBULATORY_CARE_PROVIDER_SITE_OTHER): Payer: Medicaid Other | Admitting: Family Medicine

## 2018-06-06 ENCOUNTER — Encounter: Payer: Self-pay | Admitting: Family Medicine

## 2018-06-06 VITALS — BP 111/72 | HR 75 | Wt 124.3 lb

## 2018-06-06 DIAGNOSIS — Z87898 Personal history of other specified conditions: Secondary | ICD-10-CM

## 2018-06-06 DIAGNOSIS — Z3A22 22 weeks gestation of pregnancy: Secondary | ICD-10-CM

## 2018-06-06 DIAGNOSIS — O0990 Supervision of high risk pregnancy, unspecified, unspecified trimester: Secondary | ICD-10-CM

## 2018-06-06 DIAGNOSIS — O219 Vomiting of pregnancy, unspecified: Secondary | ICD-10-CM

## 2018-06-06 DIAGNOSIS — O0992 Supervision of high risk pregnancy, unspecified, second trimester: Secondary | ICD-10-CM

## 2018-06-06 MED ORDER — ONDANSETRON 4 MG PO TBDP: 4.0000 mg | ORAL_TABLET | Freq: Three times a day (TID) | ORAL | 0 refills | Status: DC | PRN

## 2018-06-06 NOTE — Progress Notes (Signed)
   PRENATAL VISIT NOTE  Subjective:  Shirley Baker is a 28 y.o. G3P2002 at [redacted]w[redacted]d being seen today for ongoing prenatal care.  She is currently monitored for the following issues for this high-risk pregnancy and has Supervision of high risk pregnancy, antepartum; Sickle cell trait (HCC); History of low birth weight; and Nausea and vomiting in pregnancy prior to [redacted] weeks gestation on their problem list.  Patient reports nausea, vomiting and reports that unisom now helping. Makes her too sleepy and can't take it and still function..  Contractions: Not present. Vag. Bleeding: None.  Movement: Present. Denies leaking of fluid.   The following portions of the patient's history were reviewed and updated as appropriate: allergies, current medications, past family history, past medical history, past social history, past surgical history and problem list. Problem list updated.  Objective:   Vitals:   06/06/18 1516  BP: 111/72  Pulse: 75  Weight: 124 lb 4.8 oz (56.4 kg)    Fetal Status: Fetal Heart Rate (bpm): 141 Fundal Height: 22 cm Movement: Present     General:  Alert, oriented and cooperative. Patient is in no acute distress.  Skin: Skin is warm and dry. No rash noted.   Cardiovascular: Normal heart rate noted  Respiratory: Normal respiratory effort, no problems with respiration noted  Abdomen: Soft, gravid, appropriate for gestational age.  Pain/Pressure: Present     Pelvic: Cervical exam deferred        Extremities: Normal range of motion.  Edema: None  Mental Status: Normal mood and affect. Normal behavior. Normal judgment and thought content.   Assessment and Plan:  Pregnancy: G3P2002 at [redacted]w[redacted]d  1. Supervision of high risk pregnancy, antepartum - fundal height appropriate today  - follow up in 4 weeks for next visit  2. History of low birth weight - serial growth [place] - 38th% on prior; next scheduled 1/13  3. Nausea and vomiting in pregnancy prior to [redacted] weeks gestation - recommend  trying half tablet of unisom due to excessive sleepiness - also prescribed zofran for pt to trial - avoid triggers   Preterm labor symptoms and general obstetric precautions including but not limited to vaginal bleeding, contractions, leaking of fluid and fetal movement were reviewed in detail with the patient. Please refer to After Visit Summary for other counseling recommendations.  Return in about 4 weeks (around 07/04/2018) for ROB.  Future Appointments  Date Time Provider Department Center  06/12/2018 10:30 AM [facility] [place] 1 [facility]  07/03/2018  9:35 AM Adam Phenix, MD [facility]    Gwenevere Abbot, MD

## 2018-06-06 NOTE — Patient Instructions (Signed)
CIRCUMCISION  Circumcision is considered an elective/non-medically necessary procedure. There are many reasons parents decide to have their sons circumsized. During the first year of life circumcised males have a reduced risk of urinary tract infections but after this year the rates between circumcised males and uncircumcised males are the same.  It is safe to have your son circumcised outside of the hospital and the places above perform them regularly.    Places to have your son circumcised:    [facility] [phone] $480 by 4 wks  [facility] [phone] $[phone] by 4 wks  [facility] [phone] $175 by 2 wks  [facility] [phone] $250 by 7 days [facility] [phone] $269 by 4 wks  These prices sometimes change but are roughly what you can expect to pay. Please call and confirm pricing.

## 2018-06-12 ENCOUNTER — Encounter (HOSPITAL_COMMUNITY): Payer: Self-pay

## 2018-06-12 ENCOUNTER — Ambulatory Visit (HOSPITAL_COMMUNITY)
Admission: RE | Admit: 2018-06-12 | Discharge: 2018-06-12 | Disposition: A | Discharge status: Home / Self Care | Payer: Medicaid Other | Source: Ambulatory Visit | Attending: Anesthesiology | Admitting: Anesthesiology

## 2018-06-12 ENCOUNTER — Other Ambulatory Visit (HOSPITAL_COMMUNITY): Payer: Self-pay | Admitting: *Deleted

## 2018-06-12 DIAGNOSIS — Z3A23 23 weeks gestation of pregnancy: Secondary | ICD-10-CM | POA: Diagnosis not present

## 2018-06-12 DIAGNOSIS — Z363 Encounter for antenatal screening for malformations: Secondary | ICD-10-CM

## 2018-06-12 DIAGNOSIS — D573 Sickle-cell trait: Secondary | ICD-10-CM

## 2018-06-12 DIAGNOSIS — O09292 Supervision of pregnancy with other poor reproductive or obstetric history, second trimester: Secondary | ICD-10-CM

## 2018-06-12 DIAGNOSIS — Z8759 Personal history of other complications of pregnancy, childbirth and the puerperium: Secondary | ICD-10-CM | POA: Diagnosis not present

## 2018-06-12 DIAGNOSIS — O99019 Anemia complicating pregnancy, unspecified trimester: Principal | ICD-10-CM

## 2018-07-03 ENCOUNTER — Encounter: Payer: Self-pay | Admitting: Obstetrics & Gynecology

## 2018-07-03 ENCOUNTER — Telehealth: Payer: Self-pay | Admitting: Obstetrics & Gynecology

## 2018-07-03 NOTE — Telephone Encounter (Signed)
Called pt to get her rescheduled for her missed prenatal appt. No answer, LVM to give the office a call back. Letter mailed

## 2018-07-04 ENCOUNTER — Encounter: Payer: Self-pay | Admitting: Student

## 2018-07-10 ENCOUNTER — Ambulatory Visit (HOSPITAL_COMMUNITY)
Admission: RE | Admit: 2018-07-10 | Discharge: 2018-07-10 | Disposition: A | Discharge status: Home / Self Care | Payer: Medicaid Other | Source: Ambulatory Visit | Attending: Family Medicine | Admitting: Family Medicine

## 2018-07-10 ENCOUNTER — Other Ambulatory Visit (HOSPITAL_COMMUNITY): Payer: Self-pay | Admitting: *Deleted

## 2018-07-10 ENCOUNTER — Encounter (HOSPITAL_COMMUNITY): Payer: Self-pay

## 2018-07-10 DIAGNOSIS — Z362 Encounter for other antenatal screening follow-up: Secondary | ICD-10-CM

## 2018-07-10 DIAGNOSIS — O09292 Supervision of pregnancy with other poor reproductive or obstetric history, second trimester: Secondary | ICD-10-CM

## 2018-07-10 DIAGNOSIS — D573 Sickle-cell trait: Secondary | ICD-10-CM | POA: Insufficient documentation

## 2018-07-10 DIAGNOSIS — Z3A27 27 weeks gestation of pregnancy: Secondary | ICD-10-CM

## 2018-07-10 DIAGNOSIS — Z862 Personal history of diseases of the blood and blood-forming organs and certain disorders involving the immune mechanism: Secondary | ICD-10-CM | POA: Diagnosis not present

## 2018-07-10 DIAGNOSIS — Z87898 Personal history of other specified conditions: Secondary | ICD-10-CM

## 2018-07-10 DIAGNOSIS — O99019 Anemia complicating pregnancy, unspecified trimester: Secondary | ICD-10-CM | POA: Insufficient documentation

## 2018-08-07 ENCOUNTER — Ambulatory Visit (INDEPENDENT_AMBULATORY_CARE_PROVIDER_SITE_OTHER): Payer: Medicaid Other | Admitting: Family Medicine

## 2018-08-07 VITALS — BP 118/78 | HR 92 | Wt 138.4 lb

## 2018-08-07 DIAGNOSIS — Z23 Encounter for immunization: Secondary | ICD-10-CM

## 2018-08-07 DIAGNOSIS — Z87898 Personal history of other specified conditions: Secondary | ICD-10-CM

## 2018-08-07 DIAGNOSIS — Z3A31 31 weeks gestation of pregnancy: Secondary | ICD-10-CM

## 2018-08-07 DIAGNOSIS — O0990 Supervision of high risk pregnancy, unspecified, unspecified trimester: Secondary | ICD-10-CM

## 2018-08-07 DIAGNOSIS — O0993 Supervision of high risk pregnancy, unspecified, third trimester: Secondary | ICD-10-CM

## 2018-08-07 MED ORDER — COMFORT FIT MATERNITY SUPP MED MISC: 1.0000 [IU] | 0 refills | Status: DC | PRN

## 2018-08-07 NOTE — Progress Notes (Signed)
   PRENATAL VISIT NOTE  Subjective:  Shirley Baker is a 28 y.o. G3P2002 at [redacted]w[redacted]d being seen today for ongoing prenatal care.  She is currently monitored for the following issues for this high-risk pregnancy and has Supervision of high risk pregnancy, antepartum; Sickle cell trait (HCC); History of low birth weight; and Nausea and vomiting in pregnancy prior to [redacted] weeks gestation on their problem list.  Patient reports no complaints.  Contractions: Not present. Vag. Bleeding: None.  Movement: Present. Denies leaking of fluid.   The following portions of the patient's history were reviewed and updated as appropriate: allergies, current medications, past family history, past medical history, past social history, past surgical history and problem list.   Objective:   Vitals:   08/07/18 1112  BP: 118/78  Pulse: 92  Weight: 138 lb 6.4 oz (62.8 kg)    Fetal Status: Fetal Heart Rate (bpm): 141 Fundal Height: 31 cm Movement: Present     General:  Alert, oriented and cooperative. Patient is in no acute distress.  Skin: Skin is warm and dry. No rash noted.   Cardiovascular: Normal heart rate noted  Respiratory: Normal respiratory effort, no problems with respiration noted  Abdomen: Soft, gravid, appropriate for gestational age.  Pain/Pressure: Present     Pelvic: Cervical exam deferred        Extremities: Normal range of motion.  Edema: Trace  Mental Status: Normal mood and affect. Normal behavior. Normal judgment and thought content.   Assessment and Plan:  Pregnancy: G3P2002 at [redacted]w[redacted]d 1. Supervision of high risk pregnancy, antepartum Needs 2 hour screen--will do with next visit - Tdap vaccine greater than or equal to 7yo IM - Elastic Bandages & Supports (COMFORT FIT MATERNITY SUPP MED) MISC; 1 Units by Does not apply route as needed.  Dispense: 1 each; Refill: 0  2. History of low birth weight Last growth ok 07/10/18 2 lb 4 oz 56%  Preterm labor symptoms and general obstetric precautions  including but not limited to vaginal bleeding, contractions, leaking of fluid and fetal movement were reviewed in detail with the patient. Please refer to After Visit Summary for other counseling recommendations.   Return in 2 weeks (on 08/21/2018) for 28 wk labs.  Future Appointments  Date Time Provider Department Center  08/21/2018  8:15 AM [place] Large, Jethro Bastos, MD [facility]  08/21/2018  8:50 AM [facility]  08/21/2018 10:30 AM [facility] [place] 1 WH-M[facility] M[facility]-US    Reva Bores, MD

## 2018-08-07 NOTE — Patient Instructions (Signed)
 Breastfeeding  Choosing to breastfeed is one of the best decisions you can make for yourself and your baby. A change in hormones during pregnancy causes your breasts to make breast milk in your milk-producing glands. Hormones prevent breast milk from being released before your baby is born. They also prompt milk flow after birth. Once breastfeeding has begun, thoughts of your baby, as well as his or her sucking or crying, can stimulate the release of milk from your milk-producing glands. Benefits of breastfeeding Research shows that breastfeeding offers many health benefits for infants and mothers. It also offers a cost-free and convenient way to feed your baby. For your baby  Your first milk (colostrum) helps your baby's digestive system to function better.  Special cells in your milk (antibodies) help your baby to fight off infections.  Breastfed babies are less likely to develop asthma, allergies, obesity, or type 2 diabetes. They are also at lower risk for sudden infant death syndrome (SIDS).  Nutrients in breast milk are better able to meet your baby's needs compared to infant formula.  Breast milk improves your baby's brain development. For you  Breastfeeding helps to create a very special bond between you and your baby.  Breastfeeding is convenient. Breast milk costs nothing and is always available at the correct temperature.  Breastfeeding helps to burn calories. It helps you to lose the weight that you gained during pregnancy.  Breastfeeding makes your uterus return faster to its size before pregnancy. It also slows bleeding (lochia) after you give birth.  Breastfeeding helps to lower your risk of developing type 2 diabetes, osteoporosis, rheumatoid arthritis, cardiovascular disease, and breast, ovarian, uterine, and endometrial cancer later in life. Breastfeeding basics Starting breastfeeding  Find a comfortable place to sit or lie down, with your neck and back  well-supported.  Place a pillow or a rolled-up blanket under your baby to bring him or her to the level of your breast (if you are seated). Nursing pillows are specially designed to help support your arms and your baby while you breastfeed.  Make sure that your baby's tummy (abdomen) is facing your abdomen.  Gently massage your breast. With your fingertips, massage from the outer edges of your breast inward toward the nipple. This encourages milk flow. If your milk flows slowly, you may need to continue this action during the feeding.  Support your breast with 4 fingers underneath and your thumb above your nipple (make the letter "C" with your hand). Make sure your fingers are well away from your nipple and your baby's mouth.  Stroke your baby's lips gently with your finger or nipple.  When your baby's mouth is open wide enough, quickly bring your baby to your breast, placing your entire nipple and as much of the areola as possible into your baby's mouth. The areola is the colored area around your nipple. ? More areola should be visible above your baby's upper lip than below the lower lip. ? Your baby's lips should be opened and extended outward (flanged) to ensure an adequate, comfortable latch. ? Your baby's tongue should be between his or her lower gum and your breast.  Make sure that your baby's mouth is correctly positioned around your nipple (latched). Your baby's lips should create a seal on your breast and be turned out (everted).  It is common for your baby to suck about 2-3 minutes in order to start the flow of breast milk. Latching Teaching your baby how to latch onto your breast properly   is very important. An improper latch can cause nipple pain, decreased milk supply, and poor weight gain in your baby. Also, if your baby is not latched onto your nipple properly, he or she may swallow some air during feeding. This can make your baby fussy. Burping your baby when you switch breasts  during the feeding can help to get rid of the air. However, teaching your baby to latch on properly is still the best way to prevent fussiness from swallowing air while breastfeeding. Signs that your baby has successfully latched onto your nipple  Silent tugging or silent sucking, without causing you pain. Infant's lips should be extended outward (flanged).  Swallowing heard between every 3-4 sucks once your milk has started to flow (after your let-down milk reflex occurs).  Muscle movement above and in front of his or her ears while sucking. Signs that your baby has not successfully latched onto your nipple  Sucking sounds or smacking sounds from your baby while breastfeeding.  Nipple pain. If you think your baby has not latched on correctly, slip your finger into the corner of your baby's mouth to break the suction and place it between your baby's gums. Attempt to start breastfeeding again. Signs of successful breastfeeding Signs from your baby  Your baby will gradually decrease the number of sucks or will completely stop sucking.  Your baby will fall asleep.  Your baby's body will relax.  Your baby will retain a small amount of milk in his or her mouth.  Your baby will let go of your breast by himself or herself. Signs from you  Breasts that have increased in firmness, weight, and size 1-3 hours after feeding.  Breasts that are softer immediately after breastfeeding.  Increased milk volume, as well as a change in milk consistency and color by the fifth day of breastfeeding.  Nipples that are not sore, cracked, or bleeding. Signs that your baby is getting enough milk  Wetting at least 1-2 diapers during the first 24 hours after birth.  Wetting at least 5-6 diapers every 24 hours for the first week after birth. The urine should be clear or pale yellow by the age of 5 days.  Wetting 6-8 diapers every 24 hours as your baby continues to grow and develop.  At least 3 stools in  a 24-hour period by the age of 5 days. The stool should be soft and yellow.  At least 3 stools in a 24-hour period by the age of 7 days. The stool should be seedy and yellow.  No loss of weight greater than 10% of birth weight during the first 3 days of life.  Average weight gain of 4-7 oz (113-198 g) per week after the age of 4 days.  Consistent daily weight gain by the age of 5 days, without weight loss after the age of 2 weeks. After a feeding, your baby may spit up a small amount of milk. This is normal. Breastfeeding frequency and duration Frequent feeding will help you make more milk and can prevent sore nipples and extremely full breasts (breast engorgement). Breastfeed when you feel the need to reduce the fullness of your breasts or when your baby shows signs of hunger. This is called "breastfeeding on demand." Signs that your baby is hungry include:  Increased alertness, activity, or restlessness.  Movement of the head from side to side.  Opening of the mouth when the corner of the mouth or cheek is stroked (rooting).  Increased sucking sounds, smacking lips,   cooing, sighing, or squeaking.  Hand-to-mouth movements and sucking on fingers or hands.  Fussing or crying. Avoid introducing a pacifier to your baby in the first 4-6 weeks after your baby is born. After this time, you may choose to use a pacifier. Research has shown that pacifier use during the first year of a baby's life decreases the risk of sudden infant death syndrome (SIDS). Allow your baby to feed on each breast as long as he or she wants. When your baby unlatches or falls asleep while feeding from the first breast, offer the second breast. Because newborns are often sleepy in the first few weeks of life, you may need to awaken your baby to get him or her to feed. Breastfeeding times will vary from baby to baby. However, the following rules can serve as a guide to help you make sure that your baby is properly  fed:  Newborns (babies 4 weeks of age or younger) may breastfeed every 1-3 hours.  Newborns should not go without breastfeeding for longer than 3 hours during the day or 5 hours during the night.  You should breastfeed your baby a minimum of 8 times in a 24-hour period. Breast milk pumping     Pumping and storing breast milk allows you to make sure that your baby is exclusively fed your breast milk, even at times when you are unable to breastfeed. This is especially important if you go back to work while you are still breastfeeding, or if you are not able to be present during feedings. Your lactation consultant can help you find a method of pumping that works best for you and give you guidelines about how long it is safe to store breast milk. Caring for your breasts while you breastfeed Nipples can become dry, cracked, and sore while breastfeeding. The following recommendations can help keep your breasts moisturized and healthy:  Avoid using soap on your nipples.  Wear a supportive bra designed especially for nursing. Avoid wearing underwire-style bras or extremely tight bras (sports bras).  Air-dry your nipples for 3-4 minutes after each feeding.  Use only cotton bra pads to absorb leaked breast milk. Leaking of breast milk between feedings is normal.  Use lanolin on your nipples after breastfeeding. Lanolin helps to maintain your skin's normal moisture barrier. Pure lanolin is not harmful (not toxic) to your baby. You may also hand express a few drops of breast milk and gently massage that milk into your nipples and allow the milk to air-dry. In the first few weeks after giving birth, some women experience breast engorgement. Engorgement can make your breasts feel heavy, warm, and tender to the touch. Engorgement peaks within 3-5 days after you give birth. The following recommendations can help to ease engorgement:  Completely empty your breasts while breastfeeding or pumping. You may  want to start by applying warm, moist heat (in the shower or with warm, water-soaked hand towels) just before feeding or pumping. This increases circulation and helps the milk flow. If your baby does not completely empty your breasts while breastfeeding, pump any extra milk after he or she is finished.  Apply ice packs to your breasts immediately after breastfeeding or pumping, unless this is too uncomfortable for you. To do this: ? Put ice in a plastic bag. ? Place a towel between your skin and the bag. ? Leave the ice on for 20 minutes, 2-3 times a day.  Make sure that your baby is latched on and positioned properly while breastfeeding.   If engorgement persists after 48 hours of following these recommendations, contact your health care provider or a lactation consultant. Overall health care recommendations while breastfeeding  Eat 3 healthy meals and 3 snacks every day. Well-nourished mothers who are breastfeeding need an additional 450-500 calories a day. You can meet this requirement by increasing the amount of a balanced diet that you eat.  Drink enough water to keep your urine pale yellow or clear.  Rest often, relax, and continue to take your prenatal vitamins to prevent fatigue, stress, and low vitamin and mineral levels in your body (nutrient deficiencies).  Do not use any products that contain nicotine or tobacco, such as cigarettes and e-cigarettes. Your baby may be harmed by chemicals from cigarettes that pass into breast milk and exposure to secondhand smoke. If you need help quitting, ask your health care provider.  Avoid alcohol.  Do not use illegal drugs or marijuana.  Talk with your health care provider before taking any medicines. These include over-the-counter and prescription medicines as well as vitamins and herbal supplements. Some medicines that may be harmful to your baby can pass through breast milk.  It is possible to become pregnant while breastfeeding. If birth  control is desired, ask your health care provider about options that will be safe while breastfeeding your baby. Where to find more information: La Lec[employer] International: www.llli.org Contact a health care provider if:  You feel like you want to stop breastfeeding or have become frustrated with breastfeeding.  Your nipples are cracked or bleeding.  Your breasts are red, tender, or warm.  You have: ? Painful breasts or nipples. ? A swollen area on either breast. ? A fever or chills. ? Nausea or vomiting. ? Drainage other than breast milk from your nipples.  Your breasts do not become full before feedings by the fifth day after you give birth.  You feel sad and depressed.  Your baby is: ? Too sleepy to eat well. ? Having trouble sleeping. ? More than 1 week old and wetting fewer than 6 diapers in a 24-hour period. ? Not gaining weight by 5 days of age.  Your baby has fewer than 3 stools in a 24-hour period.  Your baby's skin or the white parts of his or her eyes become yellow. Get help right away if:  Your baby is overly tired (lethargic) and does not want to wake up and feed.  Your baby develops an unexplained fever. Summary  Breastfeeding offers many health benefits for infant and mothers.  Try to breastfeed your infant when he or she shows early signs of hunger.  Gently tickle or stroke your baby's lips with your finger or nipple to allow the baby to open his or her mouth. Bring the baby to your breast. Make sure that much of the areola is in your baby's mouth. Offer one side and burp the baby before you offer the other side.  Talk with your health care provider or lactation consultant if you have questions or you face problems as you breastfeed. This information is not intended to replace advice given to you by your health care provider. Make sure you discuss any questions you have with your health care provider. Document Released: 05/17/2005 Document Revised:  06/18/2016 Document Reviewed: 06/18/2016 Elsevier Interactive Patient Education  2019 Elsevier Inc.   Informacin sobre parto y trabajo de parto prematuros (Preterm Labor and Birth Information) La duracin de un embarazo normal es de 39 a 41semanas. Se llama trabajo de parto   prematuro cuando se inicia antes de las 37semanas de [place]. CULES SON LOS FACTORES DE RIESGO DEL TRABAJO DE PARTO PREMATURO? Existen mayores probabilidades de trabajo de parto prematuro en mujeres con las siguientes caractersticas:  Tienen ciertas infecciones durante el embarazo, como infeccin de vejiga, infeccin de transmisin sexual o infeccin en el tero (corioamnionitis).  Tienen el cuello del tero ms corto que lo normal.  Tuvieron trabajo de parto prematuro anteriormente.  Se sometieron a una ciruga en el cuello del tero.  Son menores de 17aos o [address] de 35aos de edad.  Son afroamericanas.  Estn embarazadas de [employer] o de varios bebs (gestacin mltiple).  Consumen drogas o fuman mientras estn embarazadas.  No aumentan de peso lo suficiente durante el [employer].  Se embarazan poco despus de [employer]. CULES SON LOS SNTOMAS DEL TRABAJO DE PARTO PREMATURO? Los sntomas del trabajo de parto prematuro incluyen lo siguiente:  Educational psychologist similares a los que ocurren durante el perodo menstrual. Los calambres pueden presentarse con diarrea.  Dolor en el abdomen o en la parte inferior de la espalda.  Contracciones uterinas regulares que se pueden sentir como una presin en el abdomen.  Una sensacin de mayor presin en la pelvis.  Aumento de la secrecin de moco acuoso o sanguinolento en la vagina.  Rotura de bolsa (rotura de saco amnitico). POR QU ES IMPORTANTE RECONOCER LOS SIGNOS DEL TRABAJO DE PARTO PREMATURO? Es Public librarian los signos del trabajo de parto prematuro porque los bebs que nacen de forma prematura pueden no estar completamente  desarrollados. Por lo tanto, pueden correr mayor riesgo de lo siguiente:  Problemas cardacos y pulmonares a Air cabin crew (crnicos).  Inmediatamente despus del parto, dificultades para regular los sistemas corporales, que incluyen glucemia, temperatura corporal, frecuencia cardaca y frecuencia respiratoria.  Hemorragia cerebral.  Parlisis cerebral.  Dificultades en el aprendizaje.  Muerte. Estos riesgos son [employer] para bebs que nacen antes de las 34semanas de [place]. CMO SE TRATA EL TRABAJO DE PARTO PREMATURO? El tratamiento depende del tiempo de su [place], su afeccin y la salud de su beb. Puede incluir lo siguiente:  Tener un punto (sutura) en el cuello del tero para evitar que este se abra demasiado pronto (cerclaje).  Tomar medicamentos, por ejemplo: ? Medicamentos hormonales. Estos se pueden administrar de forma temprana en el embarazo para ayudar a Visual merchandiser. ? Medicamentos para TEFL teacher las contracciones. ? Medicamentos que ayudan a [employer] del beb. Estos se pueden recetar si el riesgo de parto es [place]. ? Medicamentos para evitar que el beb desarrolle parlisis cerebral. Si el trabajo de parto de inicia antes de las 34semanas de [place], es posible que deba hospitalizarse. QU DEBO HACER SI CREO QUE ESTOY EN TRABAJO DE PARTO PREMATURO? Si cree que est iniciando trabajo de parto prematuro, llame al mdico de inmediato. CMO PUEDO EVITAR EL TRABAJO DE PARTO PREMATURO EN FUTUROS EMBARAZOS? Para aumentar las probabilidades de tener un embarazo a trmino, Financial planner en cuenta lo siguiente:  No consuma ningn producto que contenga tabaco, lo que incluye cigarrillos, tabaco de Theatre manager y Administrator, Civil Service. Si necesita ayuda para dejar de fumar, consulte al mdico.  No consuma drogas ni medicamentos que no sean recetados Academic librarian.  Hable con el mdico antes de tomar suplementos a base de hierbas aunque los [employer].  Asegrese de llegar a un peso Office manager.  Tenga cuidado con las infecciones. Si cree que puede tener una infeccin, consulte al [employer]  para que la revisen.  Asegrese de informarle al mdico si ha tenido trabajo de parto prematuro antes. Esta informacin no tiene Theme park manager el consejo del mdico. Asegrese de hacerle al mdico cualquier pregunta que tenga. Document Released: 08/24/2007 Document Revised: 01/17/2013 Document Reviewed: 10/08/2015 Elsevier Interactive Patient Education  2019 [employer].

## 2018-08-16 ENCOUNTER — Other Ambulatory Visit: Payer: Self-pay

## 2018-08-16 ENCOUNTER — Encounter (HOSPITAL_COMMUNITY): Payer: Self-pay

## 2018-08-16 ENCOUNTER — Inpatient Hospital Stay (HOSPITAL_COMMUNITY)
Admission: AD | Admit: 2018-08-16 | Discharge: 2018-08-17 | Disposition: A | Discharge status: Home / Self Care | Payer: Medicaid Other | Attending: Obstetrics & Gynecology | Admitting: Obstetrics & Gynecology

## 2018-08-16 DIAGNOSIS — R102 Pelvic and perineal pain: Secondary | ICD-10-CM | POA: Insufficient documentation

## 2018-08-16 DIAGNOSIS — M25552 Pain in left hip: Secondary | ICD-10-CM | POA: Diagnosis not present

## 2018-08-16 DIAGNOSIS — M549 Dorsalgia, unspecified: Secondary | ICD-10-CM | POA: Diagnosis not present

## 2018-08-16 DIAGNOSIS — M25551 Pain in right hip: Secondary | ICD-10-CM | POA: Insufficient documentation

## 2018-08-16 DIAGNOSIS — R110 Nausea: Secondary | ICD-10-CM | POA: Insufficient documentation

## 2018-08-16 DIAGNOSIS — O9989 Other specified diseases and conditions complicating pregnancy, childbirth and the puerperium: Secondary | ICD-10-CM | POA: Diagnosis not present

## 2018-08-16 DIAGNOSIS — Z79899 Other long term (current) drug therapy: Secondary | ICD-10-CM | POA: Insufficient documentation

## 2018-08-16 DIAGNOSIS — N859 Noninflammatory disorder of uterus, unspecified: Secondary | ICD-10-CM | POA: Diagnosis not present

## 2018-08-16 DIAGNOSIS — Z3A32 32 weeks gestation of pregnancy: Secondary | ICD-10-CM | POA: Insufficient documentation

## 2018-08-16 DIAGNOSIS — O26893 Other specified pregnancy related conditions, third trimester: Secondary | ICD-10-CM | POA: Diagnosis not present

## 2018-08-16 DIAGNOSIS — M545 Low back pain: Secondary | ICD-10-CM | POA: Insufficient documentation

## 2018-08-16 DIAGNOSIS — R011 Cardiac murmur, unspecified: Secondary | ICD-10-CM | POA: Insufficient documentation

## 2018-08-16 DIAGNOSIS — N858 Other specified noninflammatory disorders of uterus: Secondary | ICD-10-CM

## 2018-08-16 DIAGNOSIS — O3483 Maternal care for other abnormalities of pelvic organs, third trimester: Secondary | ICD-10-CM | POA: Diagnosis not present

## 2018-08-16 LAB — URINALYSIS, ROUTINE W REFLEX MICROSCOPIC
Bilirubin Urine: NEGATIVE
GLUCOSE, UA: NEGATIVE mg/dL
Hgb urine dipstick: NEGATIVE
Ketones, ur: NEGATIVE mg/dL
LEUKOCYTE UA: NEGATIVE
NITRITE: NEGATIVE
PH: 7 (ref 5.0–8.0)
Protein, ur: NEGATIVE mg/dL
Specific Gravity, Urine: 1.004 — ABNORMAL LOW (ref 1.005–1.030)

## 2018-08-16 LAB — FETAL FIBRONECTIN: Fetal Fibronectin: NEGATIVE

## 2018-08-16 MED ORDER — NIFEDIPINE 10 MG PO CAPS
10.0000 mg | ORAL_CAPSULE | ORAL | Status: DC | PRN
  Administered 2018-08-16 (×2): 10 mg via ORAL
  Filled 2018-08-16 (×2): qty 1

## 2018-08-16 MED ORDER — ONDANSETRON 4 MG PO TBDP
4.0000 mg | ORAL_TABLET | Freq: Once | ORAL | Status: AC
  Administered 2018-08-16: 4 mg via ORAL
  Filled 2018-08-16: qty 1

## 2018-08-16 MED ORDER — TERBUTALINE SULFATE 1 MG/ML IJ SOLN
0.2500 mg | Freq: Once | INTRAMUSCULAR | Status: AC
  Administered 2018-08-16: 0.25 mg via SUBCUTANEOUS
  Filled 2018-08-16: qty 1

## 2018-08-16 NOTE — MAU Provider Note (Signed)
Chief Complaint:  Back Pain   First Provider Initiated Contact with Patient 08/16/18 2142     HPI: Shirley Baker is a 28 y.o. G3P2002 at 47w2dwho presents to maternity admissions reporting low back pain which radiates to pelvis and hips.  Doesn't think it is contractions. No bleeding. . She reports good fetal movement, denies LOF, vaginal bleeding, vaginal itching/burning, urinary symptoms, h/a, dizziness, n/v, diarrhea, constipation or fever/chills.    Back Pain  This is a new problem. The current episode started today. The problem occurs intermittently. The problem is unchanged. The pain is present in the lumbar spine and sacro-iliac. The quality of the pain is described as aching and cramping. The pain is the same all the time. Stiffness is present all day. Pertinent negatives include no abdominal pain, dysuria, fever, headaches, numbness, paresthesias, pelvic pain or weakness. She has tried nothing for the symptoms.   RN Note: Presents with back pain since last week; increasing in intensity two days ago. Pain 9/10 radiates from hips, to lower-mid back, to pelvic area. Pt states,"I know what cxts feel like and this is different". Pt only has cxts occ. No fever. No dysuria. No vag bldg or LOF. Pos FM's    Past Medical History: Past Medical History:  Diagnosis Date  . Anemia   . Heart murmur   . Ovarian cyst   . Syncope     Past obstetric history: OB History  Gravida Para Term Preterm AB Living  3 2 2     2   SAB TAB Ectopic Multiple Live Births        0 2    # Outcome Date GA Lbr Len/2nd Weight Sex Delivery Anes PTL Lv  3 Current           2 Term 11/10/15 [redacted]w[redacted]d 25:23 / 00:03 2265 g F Vag-Spont None  LIV  1 Term 07/01/10   2722 g F Vag-Spont   LIV    Obstetric Comments  LBW    Past Surgical History: Past Surgical History:  Procedure Laterality Date  . NO PAST SURGERIES      Family History: Family History  Problem Relation Age of Onset  . Healthy Mother   . Healthy  Father   . Hearing loss Neg Hx     Social History: Social History   Tobacco Use  . Smoking status: Never Smoker  . Smokeless tobacco: Never Used  . Tobacco comment: hookah, last Aug 2019  Substance Use Topics  . Alcohol use: No  . Drug use: No    Allergies: No Known Allergies  Meds:  Medications Prior to Admission  Medication Sig Dispense Refill Last Dose  . Elastic Bandages & Supports (COMFORT FIT MATERNITY SUPP MED) MISC 1 Units by Does not apply route as needed. 1 each 0   . ondansetron (ZOFRAN ODT) 4 MG disintegrating tablet Take 1 tablet (4 mg total) by mouth every 8 (eight) hours as needed for nausea or vomiting. 20 tablet 0 Taking  . Prenat w/o A Vit-FeFum-FePo-FA (CONCEPT OB) 130-92.4-1 MG CAPS Take 1 capsule by mouth daily. 30 capsule 11 Taking  . pyridOXINE (VITAMIN B-6) 50 MG tablet Take 1 tablet (50 mg total) by mouth 3 (three) times daily. (Patient not taking: Reported on 06/06/2018) 90 tablet 1 Not Taking    I have reviewed patient's Past Medical Hx, Surgical Hx, Family Hx, Social Hx, medications and allergies.   ROS:  Review of Systems  Constitutional: Negative for fever.  Gastrointestinal: Negative for abdominal  pain.  Genitourinary: Negative for dysuria and pelvic pain.  Musculoskeletal: Positive for back pain.  Neurological: Negative for weakness, numbness, headaches and paresthesias.   Other systems negative  Physical Exam   Patient Vitals for the past 24 hrs:  BP Temp Temp src Pulse Resp SpO2 Height Weight  08/16/18 2125 118/71 98.6 F (37 C) Oral 90 16 99 % 5\' 1"  (1.549 m) 62.6 kg   Constitutional: Well-developed, well-nourished female in no acute distress.  Cardiovascular: normal rate and rhythm Respiratory: normal effort, clear to auscultation bilaterally GI: Abd soft, non-tender, gravid appropriate for gestational age.   No rebound or guarding. MS: Extremities nontender, no edema, normal ROM Neurologic: Alert and oriented x 4.  GU: Neg  CVAT.  PELVIC EXAM:   Dilation: Closed Effacement (%): Thick Station: Ballotable Presentation: Vertex Exam by:: Wynelle Bourgeois CNM  FHT:  Baseline 140 , moderate variability, accelerations present, no decelerations Contractions:  Irregular     Labs: B/Positive/-- (11/04 0000) Results for orders placed or performed during the hospital encounter of 08/16/18 (from the past 24 hour(s))  Urinalysis, Routine w reflex microscopic     Status: Abnormal   Collection Time: 08/16/18  9:28 PM  Result Value Ref Range   Color, Urine STRAW (A) YELLOW   APPearance CLEAR CLEAR   Specific Gravity, Urine 1.004 (L) 1.005 - 1.030   pH 7.0 5.0 - 8.0   Glucose, UA NEGATIVE NEGATIVE mg/dL   Hgb urine dipstick NEGATIVE NEGATIVE   Bilirubin Urine NEGATIVE NEGATIVE   Ketones, ur NEGATIVE NEGATIVE mg/dL   Protein, ur NEGATIVE NEGATIVE mg/dL   Nitrite NEGATIVE NEGATIVE   Leukocytes,Ua NEGATIVE NEGATIVE  Fetal fibronectin     Status: None   Collection Time: 08/16/18  9:52 PM  Result Value Ref Range   Fetal Fibronectin NEGATIVE NEGATIVE    Imaging:  No results found.  MAU Course/MDM: I have ordered labs and reviewed results. Fetal fibronectin is negative  UA is negative NST reviewed, reactive, category I.  Treatments in MAU included Procardia series given, without much effect                                                Terbutaline given with good relief and reduction in contractions and back pain.  Zofran given for nausea, which is chronic for her. Will give Rx for Diclegis for home use..    Assessment: SIngle intrauterine pregnancy at [redacted]w[redacted]d Preterm uterine contractions Low back pain, likely related to contractions Nausea, chronic  Plan: Discharge home Preterm Labor precautions and fetal kick counts Rx diclegis for nausea Follow up in Office for prenatal visits and recheck  Encouraged to return here or to other Urgent Care/ED if she develops worsening of symptoms, increase in pain, fever, or  other concerning symptoms.   Pt stable at time of discharge.  Wynelle Bourgeois CNM, MSN Certified Nurse-Midwife 08/16/2018 9:42 PM

## 2018-08-16 NOTE — MAU Note (Addendum)
Presents with back pain since last week; increasing in intensity two days ago. Pain 9/10 radiates from hips, to lower-mid back, to pelvic area. Pt states,"I know what cxts feel like and this is different". Pt only has cxts occ. No fever. No dysuria. No vag bldg or LOF. Pos FM's    Adah Perl RN

## 2018-08-17 DIAGNOSIS — O9989 Other specified diseases and conditions complicating pregnancy, childbirth and the puerperium: Secondary | ICD-10-CM

## 2018-08-17 DIAGNOSIS — N859 Noninflammatory disorder of uterus, unspecified: Secondary | ICD-10-CM

## 2018-08-17 DIAGNOSIS — M549 Dorsalgia, unspecified: Secondary | ICD-10-CM | POA: Diagnosis not present

## 2018-08-17 DIAGNOSIS — Z3A32 32 weeks gestation of pregnancy: Secondary | ICD-10-CM

## 2018-08-17 MED ORDER — DOXYLAMINE-PYRIDOXINE 10-10 MG PO TBEC: 2.0000 | DELAYED_RELEASE_TABLET | Freq: Every evening | ORAL | 5 refills | Status: DC | PRN

## 2018-08-17 NOTE — Discharge Instructions (Signed)
Preterm Labor and Birth Information °Pregnancy normally lasts 39-41 weeks. Preterm labor is when labor starts early. It starts before you have been pregnant for 37 whole weeks. °What are the risk factors for preterm labor? °Preterm labor is more likely to occur in women who: °· Have an infection while pregnant. °· Have a cervix that is short. °· Have gone into preterm labor before. °· Have had surgery on their cervix. °· Are younger than age 29. °· Are older than age 35. °· Are African American. °· Are pregnant with two or more babies. °· Take street drugs while pregnant. °· Smoke while pregnant. °· Do not gain enough weight while pregnant. °· Got pregnant right after another pregnancy. °What are the symptoms of preterm labor? °Symptoms of preterm labor include: °· Cramps. The cramps may feel like the cramps some women get during their period. The cramps may happen with watery poop (diarrhea). °· Pain in the belly (abdomen). °· Pain in the lower back. °· Regular contractions or tightening. It may feel like your belly is getting tighter. °· Pressure in the lower belly that seems to get stronger. °· More fluid (discharge) leaking from the vagina. The fluid may be watery or bloody. °· Water breaking. °Why is it important to notice signs of preterm labor? °Babies who are born early may not be fully developed. They have a higher chance for: °· Long-term heart problems. °· Long-term lung problems. °· Trouble controlling body systems, like breathing. °· Bleeding in the brain. °· A condition called cerebral palsy. °· Learning difficulties. °· Death. °These risks are highest for babies who are born before 34 weeks of pregnancy. °How is preterm labor treated? °Treatment depends on: °· How long you were pregnant. °· Your condition. °· The health of your baby. °Treatment may involve: °· Having a stitch (suture) placed in your cervix. When you give birth, your cervix opens so the baby can come out. The stitch keeps the cervix  from opening too soon. °· Staying at the hospital. °· Taking or getting medicines, such as: °? Hormone medicines. °? Medicines to stop contractions. °? Medicines to help the baby’s lungs develop. °? Medicines to prevent your baby from having cerebral palsy. °What should I do if I am in preterm labor? °If you think you are going into labor too soon, call your doctor right away. °How can I prevent preterm labor? °· Do not use any tobacco products. °? Examples of these are cigarettes, chewing tobacco, and e-cigarettes. °? If you need help quitting, ask your doctor. °· Do not use street drugs. °· Do not use any medicines unless you ask your doctor if they are safe for you. °· Talk with your doctor before taking any herbal supplements. °· Make sure you gain enough weight. °· Watch for infection. If you think you might have an infection, get it checked right away. °· If you have gone into preterm labor before, tell your doctor. °This information is not intended to replace advice given to you by your health care provider. Make sure you discuss any questions you have with your health care provider. °Document Released: 08/13/2008 Document Revised: 10/28/2015 Document Reviewed: 10/08/2015 °Elsevier Interactive Patient Education © 2019 [employer] ° °

## 2018-08-21 ENCOUNTER — Encounter (HOSPITAL_COMMUNITY): Payer: Self-pay

## 2018-08-21 ENCOUNTER — Ambulatory Visit (INDEPENDENT_AMBULATORY_CARE_PROVIDER_SITE_OTHER): Payer: Medicaid Other | Admitting: Obstetrics & Gynecology

## 2018-08-21 ENCOUNTER — Other Ambulatory Visit (HOSPITAL_COMMUNITY): Payer: Self-pay | Admitting: *Deleted

## 2018-08-21 ENCOUNTER — Other Ambulatory Visit: Payer: Medicaid Other

## 2018-08-21 ENCOUNTER — Ambulatory Visit (HOSPITAL_COMMUNITY): Payer: Medicaid Other | Admitting: *Deleted

## 2018-08-21 ENCOUNTER — Other Ambulatory Visit: Payer: Self-pay

## 2018-08-21 ENCOUNTER — Other Ambulatory Visit: Payer: Self-pay | Admitting: *Deleted

## 2018-08-21 ENCOUNTER — Ambulatory Visit (HOSPITAL_COMMUNITY)
Admission: RE | Admit: 2018-08-21 | Discharge: 2018-08-21 | Disposition: A | Discharge status: Home / Self Care | Payer: Medicaid Other | Source: Ambulatory Visit | Attending: Family Medicine | Admitting: Family Medicine

## 2018-08-21 VITALS — BP 110/79 | HR 90 | Temp 98.2°F | Wt 137.1 lb

## 2018-08-21 VITALS — Temp 98.6°F

## 2018-08-21 DIAGNOSIS — Z862 Personal history of diseases of the blood and blood-forming organs and certain disorders involving the immune mechanism: Secondary | ICD-10-CM

## 2018-08-21 DIAGNOSIS — Z3A33 33 weeks gestation of pregnancy: Secondary | ICD-10-CM | POA: Diagnosis not present

## 2018-08-21 DIAGNOSIS — Z362 Encounter for other antenatal screening follow-up: Secondary | ICD-10-CM | POA: Diagnosis not present

## 2018-08-21 DIAGNOSIS — O0990 Supervision of high risk pregnancy, unspecified, unspecified trimester: Secondary | ICD-10-CM

## 2018-08-21 DIAGNOSIS — O0993 Supervision of high risk pregnancy, unspecified, third trimester: Secondary | ICD-10-CM

## 2018-08-21 DIAGNOSIS — Z87898 Personal history of other specified conditions: Secondary | ICD-10-CM

## 2018-08-21 DIAGNOSIS — Z3689 Encounter for other specified antenatal screening: Secondary | ICD-10-CM

## 2018-08-21 DIAGNOSIS — O09293 Supervision of pregnancy with other poor reproductive or obstetric history, third trimester: Secondary | ICD-10-CM | POA: Diagnosis not present

## 2018-08-21 NOTE — Progress Notes (Signed)
   PRENATAL VISIT NOTE  Subjective:  Shirley Baker is a 28 y.o. G3P2002 at [redacted]w[redacted]d being seen today for ongoing prenatal care.  She is currently monitored for the following issues for this high-risk pregnancy and has Supervision of high risk pregnancy, antepartum; Sickle cell trait (HCC); History of low birth weight; and Nausea and vomiting in pregnancy prior to [redacted] weeks gestation on their problem list.  Patient reports no complaints.  Contractions: Irritability. Vag. Bleeding: None.  Movement: Present. Denies leaking of fluid.   The following portions of the patient's history were reviewed and updated as appropriate: allergies, current medications, past family history, past medical history, past social history, past surgical history and problem list.   Objective:   Vitals:   08/21/18 0833  BP: 110/79  Pulse: 90  Temp: 98.2 F (36.8 C)  Weight: 137 lb 1.6 oz (62.2 kg)    Fetal Status: Fetal Heart Rate (bpm): 143   Movement: Present     General:  Alert, oriented and cooperative. Patient is in no acute distress.  Skin: Skin is warm and dry. No rash noted.   Cardiovascular: Normal heart rate noted  Respiratory: Normal respiratory effort, no problems with respiration noted  Abdomen: Soft, gravid, appropriate for gestational age.  Pain/Pressure: Present     Pelvic: Cervical exam deferred        Extremities: Normal range of motion.  Edema: Trace  Mental Status: Normal mood and affect. Normal behavior. Normal judgment and thought content.   Assessment and Plan:  Pregnancy: G3P2002 at [redacted]w[redacted]d 1. History of low birth weight 2. Supervision of high risk pregnancy, antepartum Growth scan today. Enrolled in [employer] for BP checks.  - [facility] AMB BABYSCRIPTS SCHEDULE OPTIMIZATION Preterm labor symptoms and general obstetric precautions including but not limited to vaginal bleeding, contractions, leaking of fluid and fetal movement were reviewed in detail with the patient. Please refer to After  Visit Summary for other counseling recommendations.   Return in about 3 weeks (around 09/11/2018) for Pelvic cultures, OB Visit (HOB).  Future Appointments  Date Time Provider Department Center  08/21/2018 10:15 AM [facility] NURSE [facility]  08/21/2018 10:30 AM [facility] [place] 1 [facility]    Jaynie Collins, MD

## 2018-08-21 NOTE — Patient Instructions (Signed)
Return to office for any scheduled appointments. Call the office or go to the MAU at [facility] [place] if:  You begin to have strong, frequent contractions  Your water breaks.  Sometimes it is a big gush of fluid, sometimes it is just a trickle that keeps getting your panties wet or running down your legs  You have vaginal bleeding.  It is normal to have a small amount of spotting if your cervix was checked.   You do not feel your baby moving like normal.  If you do not, get something to eat and drink and lay down and focus on feeling your baby move.   If your baby is still not moving like normal, you should call the office or go to MAU.  Any other obstetric concerns.   

## 2018-08-22 ENCOUNTER — Encounter: Payer: Self-pay | Admitting: *Deleted

## 2018-08-22 LAB — CBC
Hematocrit: 34.6 % (ref 34.0–46.6)
Hemoglobin: 12 g/dL (ref 11.1–15.9)
MCH: 31.7 pg (ref 26.6–33.0)
MCHC: 34.7 g/dL (ref 31.5–35.7)
MCV: 91 fL (ref 79–97)
Platelets: 166 10*3/uL (ref 150–450)
RBC: 3.79 x10E6/uL (ref 3.77–5.28)
RDW: 13.1 % (ref 11.7–15.4)
WBC: 6.8 10*3/uL (ref 3.4–10.8)

## 2018-08-22 LAB — GLUCOSE TOLERANCE, 2 HOURS W/ 1HR
Glucose, 1 hour: 97 mg/dL (ref 65–179)
Glucose, 2 hour: 94 mg/dL (ref 65–152)
Glucose, Fasting: 75 mg/dL (ref 65–91)

## 2018-08-22 LAB — HIV ANTIBODY (ROUTINE TESTING W REFLEX): HIV SCREEN 4TH GENERATION: NONREACTIVE

## 2018-08-22 LAB — RPR: RPR: NONREACTIVE

## 2018-09-01 ENCOUNTER — Other Ambulatory Visit: Payer: Self-pay

## 2018-09-01 ENCOUNTER — Inpatient Hospital Stay (HOSPITAL_COMMUNITY)
Admission: AD | Admit: 2018-09-01 | Discharge: 2018-09-02 | Disposition: A | Discharge status: Home / Self Care | Payer: Medicaid Other | Source: Ambulatory Visit | Attending: Obstetrics & Gynecology | Admitting: Obstetrics & Gynecology

## 2018-09-01 ENCOUNTER — Encounter (HOSPITAL_COMMUNITY): Payer: Self-pay | Admitting: *Deleted

## 2018-09-01 DIAGNOSIS — O9989 Other specified diseases and conditions complicating pregnancy, childbirth and the puerperium: Secondary | ICD-10-CM | POA: Diagnosis not present

## 2018-09-01 DIAGNOSIS — O23593 Infection of other part of genital tract in pregnancy, third trimester: Secondary | ICD-10-CM | POA: Diagnosis not present

## 2018-09-01 DIAGNOSIS — Z3689 Encounter for other specified antenatal screening: Secondary | ICD-10-CM

## 2018-09-01 DIAGNOSIS — N760 Acute vaginitis: Secondary | ICD-10-CM | POA: Diagnosis not present

## 2018-09-01 DIAGNOSIS — Z3A34 34 weeks gestation of pregnancy: Secondary | ICD-10-CM | POA: Diagnosis not present

## 2018-09-01 DIAGNOSIS — M545 Low back pain, unspecified: Secondary | ICD-10-CM

## 2018-09-01 DIAGNOSIS — O219 Vomiting of pregnancy, unspecified: Secondary | ICD-10-CM | POA: Diagnosis not present

## 2018-09-01 DIAGNOSIS — R102 Pelvic and perineal pain: Secondary | ICD-10-CM | POA: Diagnosis not present

## 2018-09-01 DIAGNOSIS — B9689 Other specified bacterial agents as the cause of diseases classified elsewhere: Secondary | ICD-10-CM | POA: Insufficient documentation

## 2018-09-01 DIAGNOSIS — O0990 Supervision of high risk pregnancy, unspecified, unspecified trimester: Secondary | ICD-10-CM

## 2018-09-01 DIAGNOSIS — O26893 Other specified pregnancy related conditions, third trimester: Secondary | ICD-10-CM | POA: Diagnosis not present

## 2018-09-01 DIAGNOSIS — O99013 Anemia complicating pregnancy, third trimester: Secondary | ICD-10-CM | POA: Diagnosis not present

## 2018-09-01 DIAGNOSIS — D649 Anemia, unspecified: Secondary | ICD-10-CM | POA: Diagnosis not present

## 2018-09-01 DIAGNOSIS — N83201 Unspecified ovarian cyst, right side: Secondary | ICD-10-CM | POA: Diagnosis not present

## 2018-09-01 DIAGNOSIS — O3483 Maternal care for other abnormalities of pelvic organs, third trimester: Secondary | ICD-10-CM | POA: Insufficient documentation

## 2018-09-01 DIAGNOSIS — O26899 Other specified pregnancy related conditions, unspecified trimester: Secondary | ICD-10-CM

## 2018-09-01 DIAGNOSIS — Z87898 Personal history of other specified conditions: Secondary | ICD-10-CM

## 2018-09-01 LAB — URINALYSIS, ROUTINE W REFLEX MICROSCOPIC
Bilirubin Urine: NEGATIVE
Glucose, UA: NEGATIVE mg/dL
Hgb urine dipstick: NEGATIVE
Ketones, ur: NEGATIVE mg/dL
Leukocytes,Ua: NEGATIVE
Nitrite: NEGATIVE
Protein, ur: NEGATIVE mg/dL
Specific Gravity, Urine: 1.005 (ref 1.005–1.030)
pH: 6 (ref 5.0–8.0)

## 2018-09-01 LAB — WET PREP, GENITAL
Sperm: NONE SEEN
Trich, Wet Prep: NONE SEEN
Yeast Wet Prep HPF POC: NONE SEEN

## 2018-09-01 MED ORDER — LACTATED RINGERS IV BOLUS
1000.0000 mL | Freq: Once | INTRAVENOUS | Status: AC
  Administered 2018-09-01: 23:00:00 1000 mL via INTRAVENOUS

## 2018-09-01 MED ORDER — ACETAMINOPHEN 500 MG PO TABS
1000.0000 mg | ORAL_TABLET | Freq: Once | ORAL | Status: AC
  Administered 2018-09-01: 1000 mg via ORAL
  Filled 2018-09-01: qty 2

## 2018-09-01 MED ORDER — CYCLOBENZAPRINE HCL 10 MG PO TABS
10.0000 mg | ORAL_TABLET | Freq: Once | ORAL | Status: AC
  Administered 2018-09-01: 23:00:00 10 mg via ORAL
  Filled 2018-09-01: qty 1

## 2018-09-01 NOTE — Discharge Instructions (Signed)

## 2018-09-01 NOTE — MAU Provider Note (Signed)
History     CSN: [id number]  Arrival date and time: 09/01/18 2155   First Provider Initiated Contact with Patient 09/01/18 2236      Chief Complaint  Patient presents with  . Back Pain  . Abdominal Pain   HPI Shirley Baker is a 28 y.o. G3P2002 at [redacted]w[redacted]d who presents to MAU with chief complaints of low back pain, lower abdominal and perineal pressure. These are all recurring problems. Patient reports "I have good days and bad days and my bad days are getting worse". She denies vaginal bleeding, leaking of fluid, urinary symptoms decreased fetal movement, fever, falls, or recent illness.    Abdominal pain Patient rates her bilateral lower abdominal pain as 5/10. Her pain does not radiate. She pain is aggravated by walking and repositioning. She was previously prescribed a maternity belt. She reports that she wears it 4-5 times per week but has recently discontinued use as she does not feel it helps her pain.  Low back pain Patient rates her low back pain as 9/10. Her pain does not radiate. She denies aggravating or alleviating factors.  Perineal pressure Patient states her perineal and her pelvis "feel more loose" and "as if they are coming apart".  Patient has not taken medication or tried other treatments for any of these complaints.  She is requesting Terbutaline administration due to pain relief after prior administration on 08/16/18.  OB History    Gravida  3   Para  2   Term  2   Preterm      AB      Living  2     SAB      TAB      Ectopic      Multiple  0   Live Births  2        Obstetric Comments  LBW        Past Medical History:  Diagnosis Date  . Anemia   . Heart murmur   . Ovarian cyst   . Syncope     Past Surgical History:  Procedure Laterality Date  . NO PAST SURGERIES      Family History  Problem Relation Age of Onset  . Healthy Mother   . Healthy Father   . Hearing loss Neg Hx     Social History   Tobacco Use  . Smoking  status: Never Smoker  . Smokeless tobacco: Never Used  . Tobacco comment: hookah, last Aug 2019  Substance Use Topics  . Alcohol use: No  . Drug use: No    Allergies: No Known Allergies  Medications Prior to Admission  Medication Sig Dispense Refill Last Dose  . Doxylamine-Pyridoxine (DICLEGIS) 10-10 MG TBEC Take 2 tablets by mouth at bedtime as needed. 100 tablet 5 08/31/2018 at Unknown time  . Elastic Bandages & Supports (COMFORT FIT MATERNITY SUPP MED) MISC 1 Units by Does not apply route as needed. 1 each 0 08/31/2018 at Unknown time  . ondansetron (ZOFRAN ODT) 4 MG disintegrating tablet Take 1 tablet (4 mg total) by mouth every 8 (eight) hours as needed for nausea or vomiting. 20 tablet 0 09/01/2018 at Unknown time  . Prenat w/o A Vit-FeFum-FePo-FA (CONCEPT OB) 130-92.4-1 MG CAPS Take 1 capsule by mouth daily. 30 capsule 11 09/01/2018 at Unknown time  . pyridOXINE (VITAMIN B-6) 50 MG tablet Take 1 tablet (50 mg total) by mouth 3 (three) times daily. 90 tablet 1 09/01/2018 at Unknown time    Review of Systems  Constitutional: Positive for fatigue. Negative for chills and fever.  Respiratory: Negative for shortness of breath.   Gastrointestinal: Positive for abdominal pain.  Genitourinary: Negative for difficulty urinating, flank pain, vaginal bleeding, vaginal discharge and vaginal pain.  Musculoskeletal: Positive for back pain.  All other systems reviewed and are negative.  Physical Exam   Blood pressure 120/65, pulse 93, temperature 98.2 F (36.8 C), resp. rate 16, height 5\' 1"  (1.549 m), weight 65.3 kg, last menstrual period 01/02/2018, currently breastfeeding.  Physical Exam  Nursing note and vitals reviewed. Constitutional: She is oriented to person, place, and time. She appears well-developed and well-nourished.  Cardiovascular: Normal rate.  Respiratory: Effort normal. No respiratory distress.  GI: Soft. She exhibits no distension. There is no abdominal tenderness. There is no  rebound and no guarding.  Gravid  Genitourinary:    Vaginal discharge present.     Genitourinary Comments: Thin white discharge throughout vaginal vault. No CMT. Visually closed, confirmed with manual exam after FFN collection   Neurological: She is alert and oriented to person, place, and time.  Skin: Skin is warm and dry.  Psychiatric: She has a normal mood and affect. Her behavior is normal. Judgment and thought content normal.    MAU Course  Procedures  --FFN collected, cervix closed, FFN discarded --Negative FFN 03/18 --Hx term SVD x 2 --Reactive tracing: baseline 135, moderate variability, positive accels, no decels --Toco: rare contraction, UI noted --Pain improved with medication given in MAU --Explained to patient that management at previous time is different from management at 34 weeks, Terb not advised  Patient Vitals for the past 24 hrs:  BP Temp Pulse Resp Height Weight  09/02/18 0008 115/74 - 89 17 - -  09/01/18 2215 120/65 - 93 - - -  09/01/18 2213 - 98.2 F (36.8 C) - 16 5\' 1"  (1.549 m) 65.3 kg    Results for orders placed or performed during the hospital encounter of 09/01/18 (from the past 24 hour(s))  Urinalysis, Routine w reflex microscopic     Status: None   Collection Time: 09/01/18 10:40 PM  Result Value Ref Range   Color, Urine YELLOW YELLOW   APPearance CLEAR CLEAR   Specific Gravity, Urine 1.005 1.005 - 1.030   pH 6.0 5.0 - 8.0   Glucose, UA NEGATIVE NEGATIVE mg/dL   Hgb urine dipstick NEGATIVE NEGATIVE   Bilirubin Urine NEGATIVE NEGATIVE   Ketones, ur NEGATIVE NEGATIVE mg/dL   Protein, ur NEGATIVE NEGATIVE mg/dL   Nitrite NEGATIVE NEGATIVE   Leukocytes,Ua NEGATIVE NEGATIVE  Wet prep, genital     Status: Abnormal   Collection Time: 09/01/18 10:47 PM  Result Value Ref Range   Yeast Wet Prep HPF POC NONE SEEN NONE SEEN   Trich, Wet Prep NONE SEEN NONE SEEN   Clue Cells Wet Prep HPF POC PRESENT (A) NONE SEEN   WBC, Wet Prep HPF POC MODERATE (A)  NONE SEEN   Sperm NONE SEEN    Meds ordered this encounter  Medications  . lactated ringers bolus 1,000 mL  . acetaminophen (TYLENOL) tablet 1,000 mg  . cyclobenzaprine (FLEXERIL) tablet 10 mg  . cyclobenzaprine (FLEXERIL) 10 MG tablet    Sig: Take 1 tablet (10 mg total) by mouth 2 (two) times daily as needed for muscle spasms.    Dispense:  20 tablet    Refill:  0    Order Specific Question:   Supervising Provider    Answer:   Reva Bores [[id number]]  . metroNIDAZOLE (  FLAGYL) 500 MG tablet    Sig: Take 1 tablet (500 mg total) by mouth 2 (two) times daily.    Dispense:  14 tablet    Refill:  0    Order Specific Question:   Supervising Provider    Answer:   Reva Bores [[id number]]    Assessment and Plan  --28 y.o. G3P2002 at [redacted]w[redacted]d  --Reactive tracing, closed cervix --Round ligament and pubic symphysis pain, rx to pharmacy. Patient to reintroduce maternity belt use --Bacterial Vaginosis, rx to pharmacy --Discharge home in stable condition  F/U: Next OB appt 09/11/18 [facility]  Calvert Cantor, CNM 09/02/2018, 12:39 AM

## 2018-09-01 NOTE — MAU Note (Signed)
Having "severe" lower back pain and some pain in lower abd. Have had it for several wks. Some days worse than others. Last time I was here had shot for pain and helped about a wk. Denies vag bleeding or d/c

## 2018-09-02 DIAGNOSIS — R102 Pelvic and perineal pain: Secondary | ICD-10-CM | POA: Diagnosis not present

## 2018-09-02 DIAGNOSIS — O26893 Other specified pregnancy related conditions, third trimester: Secondary | ICD-10-CM | POA: Diagnosis not present

## 2018-09-02 DIAGNOSIS — O219 Vomiting of pregnancy, unspecified: Secondary | ICD-10-CM

## 2018-09-02 DIAGNOSIS — B9689 Other specified bacterial agents as the cause of diseases classified elsewhere: Secondary | ICD-10-CM | POA: Diagnosis not present

## 2018-09-02 DIAGNOSIS — O23593 Infection of other part of genital tract in pregnancy, third trimester: Secondary | ICD-10-CM

## 2018-09-02 DIAGNOSIS — N760 Acute vaginitis: Secondary | ICD-10-CM | POA: Diagnosis not present

## 2018-09-02 DIAGNOSIS — Z3A34 34 weeks gestation of pregnancy: Secondary | ICD-10-CM | POA: Diagnosis not present

## 2018-09-02 DIAGNOSIS — M545 Low back pain: Secondary | ICD-10-CM

## 2018-09-02 MED ORDER — CYCLOBENZAPRINE HCL 10 MG PO TABS: 10.0000 mg | ORAL_TABLET | Freq: Two times a day (BID) | ORAL | 0 refills | Status: DC | PRN

## 2018-09-02 MED ORDER — METRONIDAZOLE 500 MG PO TABS: 500.0000 mg | ORAL_TABLET | Freq: Two times a day (BID) | ORAL | 0 refills | Status: DC

## 2018-09-11 ENCOUNTER — Encounter: Payer: Self-pay | Admitting: Family Medicine

## 2018-09-13 ENCOUNTER — Other Ambulatory Visit: Payer: Self-pay

## 2018-09-13 ENCOUNTER — Encounter: Payer: Self-pay | Admitting: General Practice

## 2018-09-13 ENCOUNTER — Other Ambulatory Visit (HOSPITAL_COMMUNITY)
Admission: RE | Admit: 2018-09-13 | Discharge: 2018-09-13 | Disposition: A | Discharge status: Home / Self Care | Payer: Medicaid Other | Source: Ambulatory Visit | Attending: Obstetrics and Gynecology | Admitting: Obstetrics and Gynecology

## 2018-09-13 ENCOUNTER — Ambulatory Visit (INDEPENDENT_AMBULATORY_CARE_PROVIDER_SITE_OTHER): Payer: Medicaid Other | Admitting: Obstetrics and Gynecology

## 2018-09-13 ENCOUNTER — Encounter: Payer: Self-pay | Admitting: Family Medicine

## 2018-09-13 VITALS — BP 118/76 | HR 77 | Temp 97.8°F | Wt 144.5 lb

## 2018-09-13 DIAGNOSIS — O0990 Supervision of high risk pregnancy, unspecified, unspecified trimester: Secondary | ICD-10-CM

## 2018-09-13 DIAGNOSIS — O0993 Supervision of high risk pregnancy, unspecified, third trimester: Secondary | ICD-10-CM

## 2018-09-13 DIAGNOSIS — D573 Sickle-cell trait: Secondary | ICD-10-CM | POA: Diagnosis not present

## 2018-09-13 DIAGNOSIS — Z3A36 36 weeks gestation of pregnancy: Secondary | ICD-10-CM

## 2018-09-13 NOTE — Progress Notes (Signed)
   PRENATAL VISIT NOTE  Subjective:  Shirley Baker is a 28 y.o. G3P2002 at [redacted]w[redacted]d being seen today for ongoing prenatal care.  She is currently monitored for the following issues for this high-risk pregnancy and has Supervision of high risk pregnancy, antepartum; Sickle cell trait (HCC); History of low birth weight; and Nausea and vomiting in pregnancy prior to [redacted] weeks gestation on their problem list.  Patient reports no complaints.  Contractions: Not present. Vag. Bleeding: None.  Movement: Present. Denies leaking of fluid.   The following portions of the patient's history were reviewed and updated as appropriate: allergies, current medications, past family history, past medical history, past social history, past surgical history and problem list.   Objective:   Vitals:   09/13/18 1108  BP: 118/76  Pulse: 77  Temp: 97.8 F (36.6 C)  Weight: 144 lb 8 oz (65.5 kg)    Fetal Status: Fetal Heart Rate (bpm): 142   Movement: Present     General:  Alert, oriented and cooperative. Patient is in no acute distress.  Skin: Skin is warm and dry. No rash noted.   Cardiovascular: Normal heart rate noted  Respiratory: Normal respiratory effort, no problems with respiration noted  Abdomen: Soft, gravid, appropriate for gestational age.  Pain/Pressure: Absent     Pelvic: Cervical exam deferred        Extremities: Normal range of motion.  Edema: Trace  Mental Status: Normal mood and affect. Normal behavior. Normal judgment and thought content.   Assessment and Plan:  Pregnancy: G3P2002 at [redacted]w[redacted]d 1. Supervision of high risk pregnancy, antepartum  - GC/Chlamydia probe amp ([place])not at [facility] - Culture, beta strep (group b only) - Virtual visit next; patient has BP cuff at home.  - [place] scheduled for growth.  - Urine culture collected today.   Preterm labor symptoms and general obstetric precautions including but not limited to vaginal bleeding, contractions, leaking of fluid and fetal movement  were reviewed in detail with the patient. Please refer to After Visit Summary for other counseling recommendations.   Return in about 1 week (around 09/20/2018) for High Risk, virtual visit patient has cuff. .  Future Appointments  Date Time Provider Department Center  09/19/2018  9:00 AM [facility] [place] 3 [facility]  09/22/2018 11:15 AM Allie Bossier, MD [facility]    Venia Carbon, NP

## 2018-09-13 NOTE — Patient Instructions (Signed)
° ° °Coronavirus (COVID-19) and Pregnancy:  Frequently Asked Questions ° ° °How might coronavirus affect my pregnancy? °The data for COVID-19 is limited, but we know that women with other coronavirus infections (such as SARS-CoV) did NOT have miscarriage or stillbirth at higher rates than the general population.  On the other hand, we know that having other respiratory viral infections during pregnancy, such as flu, has been associated with problems like low birth weight and preterm birth. Also, having a high fever early in pregnancy may increase the risk of certain birth defects. ° °Could I transmit coronavirus to my baby during pregnancy or delivery? °Among the few case studies of infants born to mothers with COVID-19 published in peer-reviewed literature, none of the infants tested positive for the virus. And there have been no reports of mother-to-baby transmission for other coronaviruses (MERS-CoV and SARS-CoV). Also, there was no virus detected in samples of amniotic fluid or breast milk. °But there have been a few reports of newborns as young as a few days old with infection, suggesting that a mother can transmit the infection to her infant through close contact after delivery. ° °Is it safe for me to deliver at a hospital where there have been COVID-19 cases? °It should be. We know that COVID-19 is a very scary virus. The good news is that hospitals are taking great precautions to keep patients and healthcare providers safe.  According to the CDC guidelines, when a patient is even suspected to have COVID-19, they should be placed in a negative pressure room. (Think of these rooms as vacuums that suck and filter the air so it's safe for the other people in the hospital.) If there are no rooms available, these patients should be asked to wait at home until they can be accomodated safely. This should make it possible for you to deliver at the hospital without putting you or your baby at risk.  °Hospitals are  also implementing stricter visiting policies to keep patients safe. It's worth calling your hospital to check if there are any new regulations to be aware of. ° °What plans should I make now in case the hospital system is overwhelmed when it's time for me to deliver? °Every hospital is making different plans for dealing with this scenario. Talk with your doctor or midwife once you're at least [redacted] weeks pregnant. °I work in healthcare.  ° °I work in healthcare. Should I ask my doctor to excuse me from work until the baby is born? Should I ask my doctor to excuse me from work until the baby is born? What if I work in a school, the travel industry, or some other high-risk setting? °Healthcare facilities should take care to limit the exposure of pregnant employees to patients with confirmed or suspected COVID-19, just as they would with other infectious cases. If you continue working, be sure to follow the CDC's risk assessment and infection control guidelines. ° °If you work in a school, travel industry, or other high-risk setting, talk with your employer about what it's doing to protect employees and minimize infection risks. Wash your hands often. ° ° °What if my OB gets COVID-19? °If your doctor or midwife tests positive for COVID-19, they will need to be quarantined until they recover and are no longer at risk of transmitting the virus. In this case, you'll be assigned to another OB in your doctor's practice (or you may choose another practitioner yourself). °Ask your new OB or your doctor's office if you should   self-quarantine or be tested for the virus. (It will depend on when you last saw your provider and when that person tested positive.) ° °Should we hold off on trying to conceive because of COVID-19? °At this time, there's no reason to hold off on trying to get pregnant, but the data we have is really limited. For example, we don't think the virus causes birth defects or increases your risk of miscarriage.  But we don't know for sure whether you could transmit COVID-19 to your baby before or during delivery. °We also don't know if the virus lives in semen or can be sexually transmitted. ° °We have a babymoon scheduled in the next few months - should we cancel? °Yes. At this time, the virus has reached more than 140 countries, and there are travel bans to [place], most of [place], and [place]. Places where large numbers of people gather are at highest risk, especially airports and cruise ships.  If you were planning travel in the [place] note that any travel setting increases your risk of exposure, and there are already many places where everyone is being asked to stay home. To see how the virus is spreading, check The [place] Times map based on CDC data.  °For the most current advice to help you avoid exposure, check the CDC's COVID-19 travel page. ° °Will the hospital separate me from my newborn and keep the baby in quarantine? °If you don't have COVID-19 and have not been exposed to the virus, the hospital will not separate you from your newborn. °If you do test positive for COVID-19 or have been exposed but have no symptoms, the CDC, American College of Obstetricians and Gynecologists, and the Society for Maternal-Fetal Medicine all recommend that you be separated from your baby to decrease the risk of transmission to the baby. This would last until you are no longer at risk of transmitting the virus.  °This scenario would, of course, be beyond heartbreaking. Talk to the hospital, your baby's pediatrician, and your family about how to plan for care of your baby in the event that you have to be separated after delivery. And try to make sure you have the emotional support you would need to endure the sadness and stress of having to potentially wait weeks to meet your newborn. ° ° °My hospital is restricting visitors and only allowing one support person. If my support person leaves after the delivery, will they be allowed to  come back? °Every hospital has different policies. Contact your hospital or labor and delivery unit a week or so before delivery to get the most up-to-date restrictions. °In general, if your support person needs to leave, they would be allowed back unless they knew they were exposed to COVID-19 after leaving your company. ° °My mom was planning to fly here to help me care for my new baby after delivery. Should I tell her not to come? °Yes. If your mom is over 60 or has any serious chronic medical conditions (such as heart disease, lung disease, or diabetes), she is at higher risk of serious illness from COVID-19 and should avoid air travel. °And remember that any travel setting increases a person's risk of exposure. So, it may be risky to have her around the baby after she has been traveling. °For the most current advice on traveling, check the CDC's COVID-19 travel page. °

## 2018-09-14 LAB — GC/CHLAMYDIA PROBE AMP (~~LOC~~) NOT AT ARMC
Chlamydia: NEGATIVE
Neisseria Gonorrhea: NEGATIVE

## 2018-09-15 LAB — URINE CULTURE, OB REFLEX

## 2018-09-15 LAB — CULTURE, OB URINE

## 2018-09-17 LAB — CULTURE, BETA STREP (GROUP B ONLY): Strep Gp B Culture: NEGATIVE

## 2018-09-19 ENCOUNTER — Other Ambulatory Visit: Payer: Self-pay

## 2018-09-19 ENCOUNTER — Ambulatory Visit (HOSPITAL_COMMUNITY)
Admission: RE | Admit: 2018-09-19 | Discharge: 2018-09-19 | Disposition: A | Discharge status: Home / Self Care | Payer: Medicaid Other | Source: Ambulatory Visit | Attending: Obstetrics and Gynecology | Admitting: Obstetrics and Gynecology

## 2018-09-19 ENCOUNTER — Encounter: Payer: Self-pay | Admitting: General Practice

## 2018-09-19 DIAGNOSIS — Z362 Encounter for other antenatal screening follow-up: Secondary | ICD-10-CM | POA: Diagnosis not present

## 2018-09-19 DIAGNOSIS — Z862 Personal history of diseases of the blood and blood-forming organs and certain disorders involving the immune mechanism: Secondary | ICD-10-CM

## 2018-09-19 DIAGNOSIS — Z3689 Encounter for other specified antenatal screening: Secondary | ICD-10-CM

## 2018-09-19 DIAGNOSIS — Z3A37 37 weeks gestation of pregnancy: Secondary | ICD-10-CM

## 2018-09-19 DIAGNOSIS — O09293 Supervision of pregnancy with other poor reproductive or obstetric history, third trimester: Secondary | ICD-10-CM

## 2018-09-22 ENCOUNTER — Telehealth: Payer: Self-pay | Admitting: Emergency Medicine

## 2018-09-22 ENCOUNTER — Other Ambulatory Visit: Payer: Self-pay

## 2018-09-22 ENCOUNTER — Ambulatory Visit: Payer: Medicaid Other | Admitting: Obstetrics & Gynecology

## 2018-09-22 DIAGNOSIS — O0990 Supervision of high risk pregnancy, unspecified, unspecified trimester: Secondary | ICD-10-CM

## 2018-09-22 NOTE — Progress Notes (Signed)
She was called 3 times and never answered. A voicemail was left for her.

## 2018-09-22 NOTE — Telephone Encounter (Signed)
Attempted to call pt x3 for virtual appoinment. Left pt a voicemail informing her of the attempts to start her virtual visit and encouraged her to call and reschedule her visit.

## 2018-09-27 ENCOUNTER — Telehealth: Payer: Self-pay | Admitting: *Deleted

## 2018-09-27 NOTE — Telephone Encounter (Signed)
Called pt to discuss babyscripts and ensure that she is able to use the app and enter her blood pressure.  Pt did not pick up.  Left a message informing pt that she was being contacted in regards to babyscripts and requesting that she take and log a blood pressure into the app.  Advised pt to call the clinic if she has difficulty with this. MyChart message sent.

## 2018-10-05 ENCOUNTER — Encounter (HOSPITAL_COMMUNITY): Payer: Self-pay | Admitting: *Deleted

## 2018-10-05 ENCOUNTER — Other Ambulatory Visit: Payer: Self-pay

## 2018-10-05 ENCOUNTER — Inpatient Hospital Stay (EMERGENCY_DEPARTMENT_HOSPITAL)
Admission: AD | Admit: 2018-10-05 | Discharge: 2018-10-05 | Disposition: A | Discharge status: Home / Self Care | Payer: Medicaid Other | Source: Home / Self Care | Attending: Obstetrics & Gynecology | Admitting: Obstetrics & Gynecology

## 2018-10-05 DIAGNOSIS — O368130 Decreased fetal movements, third trimester, not applicable or unspecified: Secondary | ICD-10-CM | POA: Diagnosis not present

## 2018-10-05 DIAGNOSIS — Z79899 Other long term (current) drug therapy: Secondary | ICD-10-CM | POA: Insufficient documentation

## 2018-10-05 DIAGNOSIS — O479 False labor, unspecified: Secondary | ICD-10-CM

## 2018-10-05 DIAGNOSIS — Z3A39 39 weeks gestation of pregnancy: Secondary | ICD-10-CM | POA: Diagnosis not present

## 2018-10-05 DIAGNOSIS — O471 False labor at or after 37 completed weeks of gestation: Secondary | ICD-10-CM | POA: Insufficient documentation

## 2018-10-05 NOTE — MAU Note (Signed)
Pt reports to MAU c/o ctxs every 10-18min. Pt reports back pain as well. No bleeding or LOF. Pt reports DFM since yesterday. Pt reports just a couple movements today.  Pt reports "when she checks down there she is 1cm dilated."

## 2018-10-05 NOTE — MAU Note (Signed)
I have communicated with Gerrit Heck, CNM and reviewed vital signs:  Vitals:   10/05/18 0150 10/05/18 0154  BP: 118/73 118/73  Pulse: 76   Resp:    Temp:    SpO2:      Vaginal exam:  Dilation: 1.5 Effacement (%): 50 Cervical Position: Posterior Station: (indeterminate) Presentation: Undeterminable Exam by:: T Epiphany Seltzer RN ,   Also reviewed contraction pattern and that non-stress test is reactive.  It has been documented that patient is contracting every irregularly  With no cervical change over since last SVE not indicating active labor.  Patient denies any other complaints.  Based on this report provider has given order for discharge.  A discharge order and diagnosis entered by a provider.   Labor discharge instructions reviewed with patient.

## 2018-10-05 NOTE — MAU Provider Note (Signed)
History     CSN: [id number]  Arrival date and time: 10/05/18 [id number]   First Provider Initiated Contact with Patient 10/05/18 0114      Chief Complaint  Patient presents with  . Contractions   Shirley Baker is a 28 y.o. G3P2002 at 1627w3d who presents for Contractions.  She states the contractions started yesterday and rates them a 9/10 when the come.   However, patient states she is only feeling contractions every 10-15 minutes.  She also reports some decreased fetal movement and contributes this to "not paying attention."  However, she states she has felt baby move since arrival to room in MAU.  She denies LOF and vaginal bleeding. Patient questions when she will be leaving as well as when she will be induced.      OB History    Gravida  3   Para  2   Term  2   Preterm      AB      Living  2     SAB      TAB      Ectopic      Multiple  0   Live Births  2        Obstetric Comments  LBW        Past Medical History:  Diagnosis Date  . Anemia   . Heart murmur   . Ovarian cyst   . Syncope     Past Surgical History:  Procedure Laterality Date  . NO PAST SURGERIES      Family History  Problem Relation Age of Onset  . Healthy Mother   . Healthy Father   . Hearing loss Neg Hx     Social History   Tobacco Use  . Smoking status: Never Smoker  . Smokeless tobacco: Never Used  . Tobacco comment: hookah, last Aug 2019  Substance Use Topics  . Alcohol use: No  . Drug use: No    Allergies: No Known Allergies  Medications Prior to Admission  Medication Sig Dispense Refill Last Dose  . ondansetron (ZOFRAN ODT) 4 MG disintegrating tablet Take 1 tablet (4 mg total) by mouth every 8 (eight) hours as needed for nausea or vomiting. 20 tablet 0 Past Month at Unknown time  . Prenat w/o A Vit-FeFum-FePo-FA (CONCEPT OB) 130-92.4-1 MG CAPS Take 1 capsule by mouth daily. 30 capsule 11 10/04/2018 at Unknown time  . cyclobenzaprine (FLEXERIL) 10 MG tablet Take 1  tablet (10 mg total) by mouth 2 (two) times daily as needed for muscle spasms. 20 tablet 0 Unknown at Unknown time  . Doxylamine-Pyridoxine (DICLEGIS) 10-10 MG TBEC Take 2 tablets by mouth at bedtime as needed. 100 tablet 5 Unknown at Unknown time  . Elastic Bandages & Supports (COMFORT FIT MATERNITY SUPP MED) MISC 1 Units by Does not apply route as needed. 1 each 0 08/31/2018 at Unknown time  . metroNIDAZOLE (FLAGYL) 500 MG tablet Take 1 tablet (500 mg total) by mouth 2 (two) times daily. 14 tablet 0   . pyridOXINE (VITAMIN B-6) 50 MG tablet Take 1 tablet (50 mg total) by mouth 3 (three) times daily. 90 tablet 1 Unknown at Unknown time    Review of Systems  Constitutional: Negative for chills and fever.  Respiratory: Negative for cough and shortness of breath.   Gastrointestinal: Negative for constipation, diarrhea, nausea and vomiting.  Genitourinary: Negative for dysuria, vaginal bleeding and vaginal discharge.  Neurological: Negative for dizziness, light-headedness and headaches.   Physical Exam  Blood pressure 122/70, pulse 76, temperature 98.2 F (36.8 C), temperature source Oral, resp. rate 16, height 5\' 1"  (1.549 m), weight 69.4 kg, last menstrual period 01/02/2018, SpO2 99 %, currently breastfeeding.  Physical Exam  Constitutional: She is oriented to person, place, and time. She appears well-developed and well-nourished. No distress.  HENT:  Head: Normocephalic and atraumatic.  Eyes: Conjunctivae are normal.  Neck: Normal range of motion.  Cardiovascular: Normal rate.  Respiratory: Effort normal.  GI: Soft.  Gravid--fundal height appears AGA, Soft, NT. Fetus  Vertex, but asynclitic by [place].   Musculoskeletal: Normal range of motion.  Neurological: She is alert and oriented to person, place, and time.  Skin: Skin is warm and dry.  Psychiatric: She has a normal mood and affect. Her behavior is normal.     Dilation: 1.5 Effacement (%): 50 Cervical Position:  Posterior Station: (indeterminate) Presentation: Undeterminable Exam by:: T Lytle RN   Fetal Assessment 135 bpm, Mod Var, +One Variable Decel, -Accels Toco: Occasionally graphed  MAU Course  No results found for this or any previous visit (from the past 24 hour(s)). No results found.  MDM PE Labs: None EFM  Assessment and Plan  28 year old G3P2002 at 39.3weeks Cat I FT Overall Contractions DFM   -Exam findings discussed -Patient informed that since she perceived fetal movement we are now awaiting on fetal well-being as EFM is reassuring, but not reactive. -Will await and reassess need for admission vs discharge at 0145. -Also informed of need to reschedule missed ROB appt for well-being check and scheduling of IOL at 41 weeks. -Patient agreeable and without questions or concerns. -Will await for reactive NST.   Follow Up (1:49 AM) Reactive NST  -Patient endorses fetal movement stating she has felt 8 movements in the last 20 minutes. -Reiterated need to schedule ROB. -Given information regarding [facility] -Encouraged to call or return to MAU if symptoms worsen or with the onset of new symptoms. -Discharged to home in stable condition   Cherre Robins MSN, CNM 10/05/2018, 1:14 AM

## 2018-10-05 NOTE — Discharge Instructions (Signed)
°  Fetal Movement Counts    What is a fetal movement count?    A fetal movement count is the number of times that you feel your baby move during a certain amount of time. This may also be called a fetal kick count. A fetal movement count is recommended for every pregnant woman. You may be asked to start counting fetal movements as early as week 28 of your pregnancy.  Pay attention to when your baby is most active. You may notice your baby's sleep and wake cycles. You may also notice things that make your baby move more. You should do a fetal movement count:  · When your baby is normally most active.  · At the same time each day.  A good time to count movements is while you are resting, after having something to eat and drink.  How do I count fetal movements?  1. Find a quiet, comfortable area. Sit, or lie down on your side.  2. Write down the date, the start time and stop time, and the number of movements that you felt between those two times. Take this information with you to your health care visits.  3. For 2 hours, count kicks, flutters, swishes, rolls, and jabs. You should feel at least 10 movements during 2 hours.  4. You may stop counting after you have felt 10 movements.  5. If you do not feel 10 movements in 2 hours, have something to eat and drink. Then, keep resting and counting for 1 hour. If you feel at least 4 movements during that hour, you may stop counting.  Contact a health care provider if:  · You feel fewer than 4 movements in 2 hours.  · Your baby is not moving like he or she usually does.  Date: ____________ Start time: ____________ Stop time: ____________ Movements: ____________  Date: ____________ Start time: ____________ Stop time: ____________ Movements: ____________  Date: ____________ Start time: ____________ Stop time: ____________ Movements: ____________  Date: ____________ Start time: ____________ Stop time: ____________ Movements: ____________  Date: ____________ Start time: ____________  Stop time: ____________ Movements: ____________  Date: ____________ Start time: ____________ Stop time: ____________ Movements: ____________  Date: ____________ Start time: ____________ Stop time: ____________ Movements: ____________  Date: ____________ Start time: ____________ Stop time: ____________ Movements: ____________  Date: ____________ Start time: ____________ Stop time: ____________ Movements: ____________  This information is not intended to replace advice given to you by your health care provider. Make sure you discuss any questions you have with your health care provider.  Document Released: 06/16/2006 Document Revised: 01/14/2016 Document Reviewed: 06/26/2015  Elsevier Interactive Patient Education © 2019 [employer]

## 2018-10-06 ENCOUNTER — Inpatient Hospital Stay (HOSPITAL_COMMUNITY)
Admission: AD | Admit: 2018-10-06 | Discharge: 2018-10-09 | DRG: 807 | Disposition: A | Discharge status: Home / Self Care | Payer: Medicaid Other | Attending: Family Medicine | Admitting: Family Medicine

## 2018-10-06 ENCOUNTER — Encounter (HOSPITAL_COMMUNITY): Payer: Self-pay | Admitting: *Deleted

## 2018-10-06 DIAGNOSIS — Z3A39 39 weeks gestation of pregnancy: Secondary | ICD-10-CM | POA: Diagnosis not present

## 2018-10-06 DIAGNOSIS — O9902 Anemia complicating childbirth: Secondary | ICD-10-CM | POA: Diagnosis present

## 2018-10-06 DIAGNOSIS — O99013 Anemia complicating pregnancy, third trimester: Secondary | ICD-10-CM | POA: Diagnosis not present

## 2018-10-06 DIAGNOSIS — Z87898 Personal history of other specified conditions: Secondary | ICD-10-CM

## 2018-10-06 DIAGNOSIS — O368130 Decreased fetal movements, third trimester, not applicable or unspecified: Secondary | ICD-10-CM | POA: Diagnosis not present

## 2018-10-06 DIAGNOSIS — O26893 Other specified pregnancy related conditions, third trimester: Secondary | ICD-10-CM | POA: Diagnosis present

## 2018-10-06 DIAGNOSIS — D573 Sickle-cell trait: Secondary | ICD-10-CM | POA: Diagnosis present

## 2018-10-06 DIAGNOSIS — O0990 Supervision of high risk pregnancy, unspecified, unspecified trimester: Secondary | ICD-10-CM

## 2018-10-06 DIAGNOSIS — O219 Vomiting of pregnancy, unspecified: Secondary | ICD-10-CM

## 2018-10-06 MED ORDER — ACETAMINOPHEN 325 MG PO TABS: 650.0000 mg | ORAL_TABLET | ORAL | Status: DC | PRN

## 2018-10-06 MED ORDER — OXYTOCIN 40 UNITS IN NORMAL SALINE INFUSION - SIMPLE MED: 2.5000 [IU]/h | INTRAVENOUS | Status: DC

## 2018-10-06 MED ORDER — ONDANSETRON HCL 4 MG/2ML IJ SOLN: 4.0000 mg | Freq: Four times a day (QID) | INTRAMUSCULAR | Status: DC | PRN

## 2018-10-06 MED ORDER — LIDOCAINE HCL (PF) 1 % IJ SOLN: 30.0000 mL | INTRAMUSCULAR | Status: DC | PRN

## 2018-10-06 MED ORDER — SOD CITRATE-CITRIC ACID 500-334 MG/5ML PO SOLN: 30.0000 mL | ORAL | Status: DC | PRN

## 2018-10-06 MED ORDER — LACTATED RINGERS IV SOLN: 500.0000 mL | INTRAVENOUS | Status: DC | PRN

## 2018-10-06 MED ORDER — OXYCODONE-ACETAMINOPHEN 5-325 MG PO TABS: 1.0000 | ORAL_TABLET | ORAL | Status: DC | PRN

## 2018-10-06 MED ORDER — OXYCODONE-ACETAMINOPHEN 5-325 MG PO TABS: 2.0000 | ORAL_TABLET | ORAL | Status: DC | PRN

## 2018-10-06 MED ORDER — LACTATED RINGERS IV SOLN
INTRAVENOUS | Status: DC
  Administered 2018-10-06: via INTRAVENOUS

## 2018-10-06 MED ORDER — OXYTOCIN BOLUS FROM INFUSION
500.0000 mL | Freq: Once | INTRAVENOUS | Status: AC
  Administered 2018-10-07: 500 mL via INTRAVENOUS

## 2018-10-06 NOTE — MAU Note (Signed)
Pt in lobby needing to push. assisted to room via W/C. FHR 140. SVE 10/100/-2 with BBOW.  J.Emly, CNM at bedside.reports given to K.Shaw,CNM. Pt transferred via bed to L&D with midwife .

## 2018-10-06 NOTE — H&P (Signed)
Shirley Baker is a 28 y.o. female G3P2002 @ 39.4wks by LMP and confirmed by 6wk scan presenting for reg ctx. Denies leaking. No H/A, N/V or visual disturbances. Her preg has been followed by the [facility] office since 14wks and has been remarkable for 1) prev LBW infant- avg EFW this preg @ 37wks 2) sickle cell trait 3) GBS neg  OB History    Gravida  3   Para  2   Term  2   Preterm      AB      Living  2     SAB      TAB      Ectopic      Multiple  0   Live Births  2        Obstetric Comments  LBW       Past Medical History:  Diagnosis Date  . Anemia   . Heart murmur   . Ovarian cyst   . Syncope    Past Surgical History:  Procedure Laterality Date  . NO PAST SURGERIES     Family History: family history includes Healthy in her father and mother. Social History:  reports that she has never smoked. She has never used smokeless tobacco. She reports that she does not drink alcohol or use drugs.     Maternal Diabetes: No Genetic Screening: Normal Maternal Ultrasounds/Referrals: Normal Fetal Ultrasounds or other Referrals:  None Maternal Substance Abuse:  No Significant Maternal Medications:  None Significant Maternal Lab Results:  Lab values include: Group B Strep negative Other Comments:  None  ROS History   Blood pressure (!) 143/81, pulse 90, last menstrual period 01/02/2018, currently breastfeeding. Exam Physical Exam  Constitutional: She is oriented to person, place, and time. She appears well-developed.  HENT:  Head: Normocephalic.  Neck: Normal range of motion.  Cardiovascular: Normal rate.  Respiratory: Effort normal.  GI:  EFM 130s, +LTV, occ mi early variables Ctx q 1-2 mins  Genitourinary:    Genitourinary Comments: Cx C/C/vtx high/BBOW (per Gerrit Heck CNM)   Musculoskeletal: Normal range of motion.  Neurological: She is alert and oriented to person, place, and time.  Skin: Skin is warm and dry.  Psychiatric: She has a normal mood  and affect. Her behavior is normal. Thought content normal.    Prenatal labs: ABO, Rh: B/Positive/-- (11/04 0000) Antibody: Negative (11/04 0000) Rubella: Immune (11/04 0000) RPR: Non Reactive (03/23 0836)  HBsAg: Negative (11/04 0000)  HIV: Non Reactive (03/23 0836)  GBS:   negative 09/13/18  Assessment/Plan: IUP@39 .4wks Active labor GBS neg  Admit to Labor & Delivery Expectant management Anticipate SVD   Arabella Merles CNM 10/06/2018, 11:38 PM

## 2018-10-06 NOTE — Progress Notes (Signed)
JJLopez RNC CN on BS given report of pt's admission and status. To room 213 via stretcher with Gerrit Heck CNM with pt

## 2018-10-07 ENCOUNTER — Inpatient Hospital Stay (HOSPITAL_COMMUNITY): Payer: Medicaid Other | Admitting: Anesthesiology

## 2018-10-07 DIAGNOSIS — O99013 Anemia complicating pregnancy, third trimester: Secondary | ICD-10-CM

## 2018-10-07 DIAGNOSIS — Z3A39 39 weeks gestation of pregnancy: Secondary | ICD-10-CM

## 2018-10-07 LAB — RPR: RPR Ser Ql: NONREACTIVE

## 2018-10-07 LAB — CBC
HCT: 39.4 % (ref 36.0–46.0)
Hemoglobin: 13.7 g/dL (ref 12.0–15.0)
MCH: 31.8 pg (ref 26.0–34.0)
MCHC: 34.8 g/dL (ref 30.0–36.0)
MCV: 91.4 fL (ref 80.0–100.0)
Platelets: 174 10*3/uL (ref 150–400)
RBC: 4.31 MIL/uL (ref 3.87–5.11)
RDW: 12.7 % (ref 11.5–15.5)
WBC: 6.4 10*3/uL (ref 4.0–10.5)
nRBC: 0 % (ref 0.0–0.2)

## 2018-10-07 LAB — TYPE AND SCREEN
ABO/RH(D): B POS
Antibody Screen: NEGATIVE

## 2018-10-07 LAB — ABO/RH: ABO/RH(D): B POS

## 2018-10-07 MED ORDER — SENNOSIDES-DOCUSATE SODIUM 8.6-50 MG PO TABS
2.0000 | ORAL_TABLET | ORAL | Status: DC
  Administered 2018-10-07 – 2018-10-09 (×2): 2 via ORAL
  Filled 2018-10-07 (×2): qty 2

## 2018-10-07 MED ORDER — OXYCODONE HCL 5 MG PO TABS
5.0000 mg | ORAL_TABLET | ORAL | Status: DC | PRN
  Administered 2018-10-07 – 2018-10-08 (×2): 5 mg via ORAL
  Filled 2018-10-07 (×2): qty 1

## 2018-10-07 MED ORDER — IBUPROFEN 600 MG PO TABS
600.0000 mg | ORAL_TABLET | Freq: Four times a day (QID) | ORAL | Status: DC
  Administered 2018-10-07 – 2018-10-09 (×8): 600 mg via ORAL
  Filled 2018-10-07 (×8): qty 1

## 2018-10-07 MED ORDER — WITCH HAZEL-GLYCERIN EX PADS: 1.0000 "application " | MEDICATED_PAD | CUTANEOUS | Status: DC | PRN

## 2018-10-07 MED ORDER — LACTATED RINGERS IV SOLN
500.0000 mL | Freq: Once | INTRAVENOUS | Status: AC
  Administered 2018-10-07: 500 mL via INTRAVENOUS

## 2018-10-07 MED ORDER — EPHEDRINE 5 MG/ML INJ: 10.0000 mg | INTRAVENOUS | Status: DC | PRN

## 2018-10-07 MED ORDER — BENZOCAINE-MENTHOL 20-0.5 % EX AERO: 1.0000 "application " | INHALATION_SPRAY | CUTANEOUS | Status: DC | PRN

## 2018-10-07 MED ORDER — COCONUT OIL OIL: 1.0000 "application " | TOPICAL_OIL | Status: DC | PRN

## 2018-10-07 MED ORDER — DIPHENHYDRAMINE HCL 50 MG/ML IJ SOLN: 12.5000 mg | INTRAMUSCULAR | Status: DC | PRN

## 2018-10-07 MED ORDER — TETANUS-DIPHTH-ACELL PERTUSSIS 5-2.5-18.5 LF-MCG/0.5 IM SUSP: 0.5000 mL | Freq: Once | INTRAMUSCULAR | Status: DC

## 2018-10-07 MED ORDER — PHENYLEPHRINE 40 MCG/ML (10ML) SYRINGE FOR IV PUSH (FOR BLOOD PRESSURE SUPPORT): 80.0000 ug | PREFILLED_SYRINGE | INTRAVENOUS | Status: DC | PRN

## 2018-10-07 MED ORDER — ONDANSETRON HCL 4 MG PO TABS: 4.0000 mg | ORAL_TABLET | ORAL | Status: DC | PRN

## 2018-10-07 MED ORDER — FENTANYL-BUPIVACAINE-NACL 0.5-0.125-0.9 MG/250ML-% EP SOLN: 12.0000 mL/h | EPIDURAL | Status: DC | PRN

## 2018-10-07 MED ORDER — FENTANYL-BUPIVACAINE-NACL 0.5-0.125-0.9 MG/250ML-% EP SOLN
12.0000 mL/h | EPIDURAL | Status: DC | PRN
  Filled 2018-10-07: qty 250

## 2018-10-07 MED ORDER — ACETAMINOPHEN 325 MG PO TABS
650.0000 mg | ORAL_TABLET | ORAL | Status: DC | PRN
  Administered 2018-10-07: 650 mg via ORAL
  Filled 2018-10-07: qty 2

## 2018-10-07 MED ORDER — SODIUM CHLORIDE (PF) 0.9 % IJ SOLN
INTRAMUSCULAR | Status: DC | PRN
  Administered 2018-10-07: 14 mL/h via EPIDURAL

## 2018-10-07 MED ORDER — ONDANSETRON HCL 4 MG/2ML IJ SOLN: 4.0000 mg | INTRAMUSCULAR | Status: DC | PRN

## 2018-10-07 MED ORDER — PRENATAL MULTIVITAMIN CH
1.0000 | ORAL_TABLET | Freq: Every day | ORAL | Status: DC
  Administered 2018-10-07: 1 via ORAL
  Filled 2018-10-07: qty 1

## 2018-10-07 MED ORDER — ZOLPIDEM TARTRATE 5 MG PO TABS: 5.0000 mg | ORAL_TABLET | Freq: Every evening | ORAL | Status: DC | PRN

## 2018-10-07 MED ORDER — DIBUCAINE (PERIANAL) 1 % EX OINT: 1.0000 "application " | TOPICAL_OINTMENT | CUTANEOUS | Status: DC | PRN

## 2018-10-07 MED ORDER — SIMETHICONE 80 MG PO CHEW: 80.0000 mg | CHEWABLE_TABLET | ORAL | Status: DC | PRN

## 2018-10-07 MED ORDER — LIDOCAINE HCL (PF) 1 % IJ SOLN
INTRAMUSCULAR | Status: DC | PRN
  Administered 2018-10-07 (×2): 5 mL via EPIDURAL

## 2018-10-07 MED ORDER — DIPHENHYDRAMINE HCL 25 MG PO CAPS: 25.0000 mg | ORAL_CAPSULE | Freq: Four times a day (QID) | ORAL | Status: DC | PRN

## 2018-10-07 NOTE — Discharge Instructions (Signed)

## 2018-10-07 NOTE — Progress Notes (Addendum)
Patient ID: Shirley Baker, female   DOB: 12/13/1990, 28 y.o.   MRN: [id number]  Pt desires AROM  FHR 140s, avg LTV, occ variables Ctx q 2 mins Cx 8/80/vtx -1, SROM with exam for mod MSF  IUP@term  Active labor/transition  Anticipate increased labor and SVD  Arabella Merles CNM 10/07/2018 12:05 AM

## 2018-10-07 NOTE — Anesthesia Postprocedure Evaluation (Signed)
Anesthesia Post Note  Patient: Shirley Baker  Procedure(s) Performed: AN AD HOC LABOR EPIDURAL     Patient location during evaluation: Mother Baby Anesthesia Type: Epidural Level of consciousness: awake and alert and oriented Pain management: satisfactory to patient Vital Signs Assessment: post-procedure vital signs reviewed and stable Respiratory status: spontaneous breathing and nonlabored ventilation Cardiovascular status: stable Postop Assessment: no headache, no backache, no signs of nausea or vomiting, adequate PO intake, patient able to bend at knees and able to ambulate (patient up walking) Anesthetic complications: no    Last Vitals:  Vitals:   10/07/18 0415 10/07/18 0900  BP: 128/78 (!) 131/94  Pulse: 89 65  Resp: 17 18  Temp: 36.9 C 36.9 C    Last Pain:  Vitals:   10/07/18 0931  TempSrc:   PainSc: 5    Pain Goal:                   Madison Hickman

## 2018-10-07 NOTE — Discharge Summary (Addendum)
Postpartum Discharge Summary     Patient Name: Shirley Baker DOB: 05-09-1991 MRN: [id number]  Date of admission: 10/06/2018 Delivering Provider: Cam HaiSHAW, KIMBERLY D   Date of discharge: 10/09/2018  Admitting diagnosis: 39 wks labor Intrauterine pregnancy: 5642w5d     Secondary diagnosis:  Active Problems:   Sickle cell trait (HCC)   History of low birth weight  Additional problems: none     Discharge diagnosis: Term Pregnancy Delivered                                                                                                Post partum procedures:none  Augmentation: none  Complications: None  Hospital course:  Onset of Labor With Vaginal Delivery     28 y.o. yo G3P2002 at 9042w5d was admitted in Active Labor on 10/06/2018. Patient had an uncomplicated precipitous labor course as follows:  Membrane Rupture Time/Date: 12:01 AM ,10/07/2018   Intrapartum Procedures: Episiotomy: None [1]                                         Lacerations:  None [1]  Patient had a delivery of a Viable infant. 10/07/2018  Information for the patient's newborn:  Donn PieriniMaiga, Boy Zaryiah [[id number]][[id number]]  Delivery Method: Vaginal, Spontaneous(Filed from Delivery Summary)    Pateint had an uncomplicated postpartum course.  She is ambulating, tolerating a regular diet, passing flatus, and urinating well. Patient is discharged home in stable condition on 10/09/18.   Magnesium Sulfate recieved: No BMZ received: No  Physical exam  Vitals:   10/08/18 0500 10/08/18 1745 10/08/18 2054 10/09/18 0525  BP: 124/87 110/75 126/81 115/80  Pulse: 60 63 79 66  Resp: 18 18 16 16   Temp: 98.7 F (37.1 C) 98.9 F (37.2 C) 98.7 F (37.1 C) 98.4 F (36.9 C)  TempSrc: Oral  Oral Oral  SpO2: 100%      General: alert, cooperative and no distress Lochia: appropriate Uterine Fundus: firm Incision: N/A DVT Evaluation: No evidence of DVT seen on physical exam. Labs: Lab Results  Component Value Date   WBC 6.4 10/06/2018   HGB  13.7 10/06/2018   HCT 39.4 10/06/2018   MCV 91.4 10/06/2018   PLT 174 10/06/2018   No flowsheet data found.  Discharge instruction: per After Visit Summary and "Baby and Me Booklet".  After visit meds:  Allergies as of 10/09/2018   No Known Allergies     Medication List    STOP taking these medications   Comfort Fit Maternity Supp Med Misc   cyclobenzaprine 10 MG tablet Commonly known as:  FLEXERIL   Doxylamine-Pyridoxine 10-10 MG Tbec Commonly known as:  Diclegis   ondansetron 4 MG disintegrating tablet Commonly known as:  Zofran ODT   pyridOXINE 50 MG tablet Commonly known as:  VITAMIN B-6     TAKE these medications   Concept OB 130-92.4-1 MG Caps Take 1 capsule by mouth daily.   ibuprofen 600 MG tablet Commonly known as:  ADVIL Take 1 tablet (600  mg total) by mouth every 6 (six) hours.   metroNIDAZOLE 500 MG tablet Commonly known as:  Flagyl Take 1 tablet (500 mg total) by mouth 2 (two) times daily.       Diet: routine diet  Activity: Advance as tolerated. Pelvic rest for 6 weeks.   Outpatient follow up:4 weeks Follow up Appt: Future Appointments  Date Time Provider Department Center  10/11/2018  8:35 AM Marny Lowenstein, PA-C [facility]   Follow up Visit: Follow-up Information    Center for [facility]. Schedule an appointment as soon as possible for a visit in 4 week(s).   Specialty:  Obstetrics and Gynecology Why:  for postpartum checkup Contact information: [address], [address] [id number] [address] [phone]          Please schedule this patient for Postpartum visit in: 4 weeks with the following provider: Any provider For C/S patients schedule nurse incision check in weeks 2 weeks: no Low risk pregnancy complicated by: none Delivery mode:  SVD Anticipated Birth Control:  IUD PP Procedures needed: none  Schedule Integrated BH visit: no  Newborn Data: Live  born female  Birth Weight:  2860 gm (6lb 4.9oz) APGAR: 6, 9  Newborn Delivery   Birth date/time:  10/07/2018 01:25:00 Delivery type:  Vaginal, Spontaneous     Baby Feeding: Bottle Disposition:home with mother   10/09/2018 Scheryl Darter, MD

## 2018-10-07 NOTE — Anesthesia Preprocedure Evaluation (Signed)

## 2018-10-07 NOTE — Anesthesia Procedure Notes (Signed)
Epidural Patient location during procedure: OB  Staffing Anesthesiologist: Maveryk Renstrom, MD Performed: anesthesiologist   Preanesthetic Checklist Completed: patient identified, site marked, surgical consent, pre-op evaluation, timeout performed, IV checked, risks and benefits discussed and monitors and equipment checked  Epidural Patient position: sitting Prep: DuraPrep Patient monitoring: heart rate, continuous pulse ox and blood pressure Approach: right paramedian Location: L3-L4 Injection technique: LOR saline  Needle:  Needle type: Tuohy  Needle gauge: 17 G Needle length: 9 cm and 9 Needle insertion depth: 6 cm Catheter type: closed end flexible Catheter size: 20 Guage Catheter at skin depth: 10 cm Test dose: negative  Assessment Events: blood not aspirated, injection not painful, no injection resistance, negative IV test and no paresthesia  Additional Notes Patient identified. Risks/Benefits/Options discussed with patient including but not limited to bleeding, infection, nerve damage, paralysis, failed block, incomplete pain control, headache, blood pressure changes, nausea, vomiting, reactions to medication both or allergic, itching and postpartum back pain. Confirmed with bedside nurse the patient's most recent platelet count. Confirmed with patient that they are not currently taking any anticoagulation, have any bleeding history or any family history of bleeding disorders. Patient expressed understanding and wished to proceed. All questions were answered. Sterile technique was used throughout the entire procedure. Please see nursing notes for vital signs. Test dose was given through epidural needle and negative prior to continuing to dose epidural or start infusion. Warning signs of high block given to the patient including shortness of breath, tingling/numbness in hands, complete motor block, or any concerning symptoms with instructions to call for help. Patient was given  instructions on fall risk and not to get out of bed. All questions and concerns addressed with instructions to call with any issues.     

## 2018-10-08 NOTE — Progress Notes (Signed)
Post Partum Day 1 Subjective: no complaints, up ad lib, voiding and tolerating PO, small lochia, plans to breastfeed, IUD  Objective: Blood pressure 124/87, pulse 60, temperature 98.7 F (37.1 C), temperature source Oral, resp. rate 18, last menstrual period 01/02/2018, SpO2 100 %, currently breastfeeding.  Physical Exam:  General: alert, cooperative and no distress Lochia:normal flow Chest: CTAB Heart: RRR no m/r/g Abdomen: +BS, soft, nontender,  Uterine Fundus: firm DVT Evaluation: No evidence of DVT seen on physical exam. Extremities: no edema  Recent Labs    10/06/18 2352  HGB 13.7  HCT 39.4    Assessment/Plan: Plan for discharge tomorrow   LOS: 2 days   Jacklyn Shell 10/08/2018, 7:47 AM

## 2018-10-09 ENCOUNTER — Encounter (HOSPITAL_COMMUNITY): Payer: Self-pay | Admitting: *Deleted

## 2018-10-09 MED ORDER — IBUPROFEN 600 MG PO TABS: 600.0000 mg | ORAL_TABLET | Freq: Four times a day (QID) | ORAL | 0 refills | Status: DC

## 2018-10-11 ENCOUNTER — Ambulatory Visit: Payer: Medicaid Other | Admitting: Obstetrics & Gynecology

## 2018-10-11 ENCOUNTER — Other Ambulatory Visit: Payer: Self-pay

## 2018-10-11 NOTE — Progress Notes (Signed)
dnka

## 2018-10-11 NOTE — Progress Notes (Signed)
Attempted to call pt x3 with no success. Pt was contacted 10 minutes prior, at the time of, and 10 minutes after her scheduled visit. Voicemail was left informing pt of attempts to start virtual visit. Pt was encouraged to call and reschedule appointment.

## 2018-10-13 ENCOUNTER — Encounter: Payer: Medicaid Other | Admitting: Obstetrics and Gynecology

## 2018-10-18 ENCOUNTER — Telehealth: Payer: Self-pay

## 2018-10-18 NOTE — Telephone Encounter (Signed)
Shirley Baker, from [facility], called and stated that she saw the pt on 10/17/18 and the pt reported large clots that was bigger than golf ball size two days after she delivered.  Shirley Baker reports the pt had one time episode with the clots and is requesting a f/u.  Called pt and f/u with her.  Pt stated the same as the [facility] provider.  Pt stated that she has not had it since.  I explained to the pt that it is normal especially close to after she had delivered, it;s her body getting rid of everything.  Pt stated understanding and did not have any other questions.

## 2018-10-19 DIAGNOSIS — Z0389 Encounter for observation for other suspected diseases and conditions ruled out: Secondary | ICD-10-CM | POA: Diagnosis not present

## 2018-10-19 DIAGNOSIS — Z1388 Encounter for screening for disorder due to exposure to contaminants: Secondary | ICD-10-CM | POA: Diagnosis not present

## 2018-10-19 DIAGNOSIS — Z3009 Encounter for other general counseling and advice on contraception: Secondary | ICD-10-CM | POA: Diagnosis not present

## 2018-11-07 ENCOUNTER — Telehealth: Payer: Self-pay | Admitting: Family Medicine

## 2018-11-07 NOTE — Telephone Encounter (Signed)
Called and spoke to patient she is aware of her mychart visit, app has been downloaded

## 2018-11-08 ENCOUNTER — Telehealth: Payer: Self-pay | Admitting: Obstetrics & Gynecology

## 2018-11-08 NOTE — Telephone Encounter (Signed)
Patient was called, and already has her MyChart app. Information about this visit was discussed with patient.

## 2018-11-09 ENCOUNTER — Telehealth (INDEPENDENT_AMBULATORY_CARE_PROVIDER_SITE_OTHER): Payer: Medicaid Other | Admitting: Obstetrics & Gynecology

## 2018-11-09 ENCOUNTER — Other Ambulatory Visit: Payer: Self-pay

## 2018-11-09 NOTE — Progress Notes (Signed)
Subjective:     Sache Sane is a 28 y.o.P58 (62, 68, 72 weeks old kids)  female who presents for a postpartum visit. She is 4 weeks postpartum following a spontaneous vaginal delivery. I have fully reviewed the prenatal and intrapartum course. The delivery was at 39.5 gestational weeks. Outcome: spontaneous vaginal delivery. Anesthesia: epidural Postpartum course has been normal. Baby's course has been normal. Baby is feeding by both breast and bottle - Carnation Good Start. Bleeding thin lochia. Bowel function is normal. Bladder function is normal. Patient is not sexually active. Contraception method is her desire for contraception. She has tried OCPs, depo provera, and Nexplanon. Postpartum depression screening: negative.  The following portions of the patient's history were reviewed and updated as appropriate: allergies, current medications, past family history, past medical history, past social history, past surgical history and problem list.  Review of Systems Pertinent items are noted in HPI.   Objective:    There were no vitals taken for this visit.   Assessment:   Normal postpartum exam. Pap smear not done at today's visit.   Plan:    1. Contraception: condoms 2. If she does indeed want a BTL, then she will need to come in and sign Medicaid papers "sometime next week". 3. Follow up in: 1 week or as needed.

## 2018-11-09 NOTE — Progress Notes (Signed)
Postpartum Visit Patient is here for a postpartum visit. She is 5 weeks postpartum following a spontaneous vaginal delivery. I have fully reviewed the prenatal and intrapartum course. The delivery was at [redacted]w[redacted]d gestational weeks. Outcome: spontaneous vaginal delivery. Anesthesia: epidural.  Postpartum course has been uncomplicated. Baby's course has been uncomplicated. Baby is feeding by both breast and bottle - Fawn Kirk. Bleeding no bleeding. Bowel function is normal. Bladder function is normal. Patient is sexually active. Contraception method is BTL Postpartum depression screening: negative.

## 2018-11-22 ENCOUNTER — Telehealth: Payer: Self-pay | Admitting: *Deleted

## 2018-11-22 NOTE — Telephone Encounter (Signed)
-----   Message from Francia Greaves sent at 11/20/2018  3:29 PM EDT ----- Regarding: Surgery 08/26 Done. Posted for 01/24/2019 @0900  w/ Hulan Fray (Tentatively) CPT [phone]  Please have her come in the office and sign tubal papers asap. The appt "next week" Dr. Hulan Fray is referencing below has been cancelled ----- Message ----- From: Emily Filbert, MD Sent: 11/09/2018   9:46 AM EDT To: Olegario Shearer Battle  Please schedule her for a laparoscopic application of Filsche clips. She will sign papers next week.

## 2018-11-22 NOTE — Telephone Encounter (Signed)
Called pt to request she come into the clinic ASAP to fill out BTL paperwork.  Pt did not pick up.  Left message advising pt that there was paperwork we need for her to sign and requesting she contact the clinic.  Mychart message sent.

## 2018-11-23 ENCOUNTER — Telehealth: Payer: Self-pay | Admitting: Obstetrics & Gynecology

## 2018-11-23 NOTE — Telephone Encounter (Signed)
Attempted to call patient to come in and sign BTS consent in order to get surgery.

## 2018-11-24 ENCOUNTER — Encounter: Payer: Self-pay | Admitting: Family Medicine

## 2018-11-24 ENCOUNTER — Encounter: Payer: Self-pay | Admitting: Obstetrics & Gynecology

## 2018-12-18 ENCOUNTER — Telehealth: Payer: Self-pay

## 2018-12-18 NOTE — Telephone Encounter (Signed)
Called pt to make aware that we need for her to come in office to sign Tubal Papers before Friday 01/22/19, no answer, left VM.Pt is also showing active in My Chart, will send message.

## 2018-12-18 NOTE — Telephone Encounter (Signed)
-----   Message from Francia Greaves sent at 12/18/2018  2:35 PM EDT ----- Regarding: Needs to Sign BTL papers by Friday 08/24 Needs to sign BTL papers or her surgery will be cancelled Please let me know if this is not done, so I can cancel her surgery

## 2018-12-29 ENCOUNTER — Telehealth: Payer: Self-pay

## 2018-12-29 NOTE — Telephone Encounter (Signed)
Called pt to ask if she could still come in and sign BTL,no answer, left VM, advised surgery can't be done until papers are signed.

## 2019-01-13 ENCOUNTER — Encounter: Payer: Self-pay | Admitting: Obstetrics and Gynecology

## 2019-01-24 ENCOUNTER — Encounter (HOSPITAL_BASED_OUTPATIENT_CLINIC_OR_DEPARTMENT_OTHER): Payer: Self-pay

## 2019-01-24 ENCOUNTER — Ambulatory Visit (HOSPITAL_BASED_OUTPATIENT_CLINIC_OR_DEPARTMENT_OTHER): Admit: 2019-01-24 | Payer: Medicaid Other | Admitting: Obstetrics & Gynecology

## 2019-01-24 SURGERY — LIGATION, FALLOPIAN TUBE, LAPAROSCOPIC
Anesthesia: Choice | Laterality: Bilateral

## 2019-04-10 ENCOUNTER — Encounter: Payer: Self-pay | Admitting: Nurse Practitioner

## 2019-04-10 ENCOUNTER — Ambulatory Visit: Payer: Medicaid Other | Attending: Nurse Practitioner | Admitting: Nurse Practitioner

## 2019-04-10 ENCOUNTER — Other Ambulatory Visit: Payer: Self-pay

## 2019-04-10 DIAGNOSIS — G43709 Chronic migraine without aura, not intractable, without status migrainosus: Secondary | ICD-10-CM | POA: Diagnosis not present

## 2019-04-10 DIAGNOSIS — G43909 Migraine, unspecified, not intractable, without status migrainosus: Secondary | ICD-10-CM | POA: Diagnosis present

## 2019-04-10 MED ORDER — TOPIRAMATE 25 MG PO TABS: 25.0000 mg | ORAL_TABLET | Freq: Every day | ORAL | 1 refills | Status: DC

## 2019-04-10 NOTE — Progress Notes (Signed)
New Patient   2 months she scheduled for allergies on her face but its gone now  Per pt she have hx of Migraines and difficulty sleeping

## 2019-04-10 NOTE — Progress Notes (Signed)
Virtual Visit via Telephone Note Due to national recommendations of social distancing due to COVID 19, telehealth visit is felt to be most appropriate for this patient at this time.  I discussed the limitations, risks, security and privacy concerns of performing an evaluation and management service by telephone and the availability of in person appointments. I also discussed with the patient that there may be a patient responsible charge related to this service. The patient expressed understanding and agreed to proceed.    I connected with Shirley Baker on 04/10/19  at   1:50 PM EST  EDT by telephone and verified that I am speaking with the correct person using two identifiers.   Consent I discussed the limitations, risks, security and privacy concerns of performing an evaluation and management service by telephone and the availability of in person appointments. I also discussed with the patient that there may be a patient responsible charge related to this service. The patient expressed understanding and agreed to proceed.   Location of Patient: Private Residence   Location of Provider: [facility]    Persons participating in Telemedicine visit: [name] FNP-BC YY [place] CMA Serenah Jacques    History of Present Illness: Telemedicine visit for: Establish Care  Migraines Takes ibuprofen for her migraines which do not relieve her headaches. Migraines are constant. Denies nausea, vomiting, photophobia, facial numbness.  Onset since childhood. She has never seen a neurologist. Pain described as : Sharp stabbing pain, electrical sensations.  Migraines are located in different regions of the head. Relieving factors: NONE. Go away on their own.     Past Medical History:  Diagnosis Date  . Anemia   . Heart murmur   . Ovarian cyst   . Syncope     Past Surgical History:  Procedure Laterality Date  . NO PAST SURGERIES      Family History  Problem  Relation Age of Onset  . Healthy Mother   . Healthy Father   . Hearing loss Neg Hx     Social History   Socioeconomic History  . Marital status: Single    Spouse name: Not on file  . Number of children: 2  . Years of education: Not on file  . Highest education level: Associate degree: occupational, Scientist, product/process development, or vocational program  Occupational History  . Not on file  Social Needs  . Financial resource strain: Not hard at all  . Food insecurity    Worry: Never true    Inability: Never true  . Transportation needs    Medical: No    Non-medical: No  Tobacco Use  . Smoking status: Never Smoker  . Smokeless tobacco: Never Used  . Tobacco comment: hookah, last Aug 2019  Substance and Sexual Activity  . Alcohol use: No  . Drug use: No  . Sexual activity: Not Currently    Birth control/protection: None  Lifestyle  . Physical activity    Days per week: 0 days    Minutes per session: Not on file  . Stress: Not at all  Relationships  . Social connections    Talks on phone: More than three times a week    Gets together: Twice a week    Attends religious service: More than 4 times per year    Active member of club or organization: No    Attends meetings of clubs or organizations: Never    Relationship status: Never married  Other Topics Concern  . Not on file  Social History Narrative  . Not on file     Observations/Objective: Awake, alert and oriented x 3   Review of Systems  Constitutional: Negative for fever, malaise/fatigue and weight loss.  HENT: Negative.  Negative for nosebleeds.   Eyes: Negative.  Negative for blurred vision, double vision and photophobia.  Respiratory: Negative.  Negative for cough and shortness of breath.   Cardiovascular: Negative.  Negative for chest pain, palpitations and leg swelling.  Gastrointestinal: Negative.  Negative for heartburn, nausea and vomiting.  Musculoskeletal: Negative.  Negative for myalgias.  Neurological: Positive  for headaches. Negative for dizziness, focal weakness and seizures.  Psychiatric/Behavioral: Negative.  Negative for suicidal ideas.    Assessment and Plan: Diagnoses and all orders for this visit:  Chronic migraine without aura without status migrainosus, not intractable -     topiramate (TOPAMAX) 25 MG tablet; Take 1 tablet (25 mg total) by mouth daily. May increase by 1 (one) tablet every week for the next 4 weeks with maximum dose of 100 mg.     Follow Up Instructions Return in about 4 weeks (around 05/08/2019) for MIGRAINES.     I discussed the assessment and treatment plan with the patient. The patient was provided an opportunity to ask questions and all were answered. The patient agreed with the plan and demonstrated an understanding of the instructions.   The patient was advised to call back or seek an in-person evaluation if the symptoms worsen or if the condition fails to improve as anticipated.  I provided 17 minutes of non-face-to-face time during this encounter including median intraservice time, reviewing previous notes, labs, imaging, medications and explaining diagnosis and management.  [name], FNP-BC

## 2019-05-07 ENCOUNTER — Other Ambulatory Visit: Payer: Self-pay | Admitting: Family Medicine

## 2019-05-07 DIAGNOSIS — O0990 Supervision of high risk pregnancy, unspecified, unspecified trimester: Secondary | ICD-10-CM

## 2019-06-30 ENCOUNTER — Encounter (HOSPITAL_COMMUNITY): Payer: Self-pay | Admitting: Emergency Medicine

## 2019-06-30 ENCOUNTER — Inpatient Hospital Stay (HOSPITAL_COMMUNITY): Payer: Medicaid Other

## 2019-06-30 ENCOUNTER — Inpatient Hospital Stay (HOSPITAL_COMMUNITY)
Admission: EM | Admit: 2019-06-30 | Discharge: 2019-06-30 | Disposition: A | Discharge status: Home / Self Care | Payer: Medicaid Other | Attending: Family Medicine | Admitting: Family Medicine

## 2019-06-30 ENCOUNTER — Other Ambulatory Visit: Payer: Self-pay

## 2019-06-30 DIAGNOSIS — Z3A01 Less than 8 weeks gestation of pregnancy: Secondary | ICD-10-CM | POA: Diagnosis not present

## 2019-06-30 DIAGNOSIS — O4691 Antepartum hemorrhage, unspecified, first trimester: Secondary | ICD-10-CM

## 2019-06-30 DIAGNOSIS — O209 Hemorrhage in early pregnancy, unspecified: Secondary | ICD-10-CM | POA: Diagnosis not present

## 2019-06-30 DIAGNOSIS — O4690 Antepartum hemorrhage, unspecified, unspecified trimester: Secondary | ICD-10-CM

## 2019-06-30 LAB — CBC
HCT: 37.6 % (ref 36.0–46.0)
Hemoglobin: 13.2 g/dL (ref 12.0–15.0)
MCH: 30.8 pg (ref 26.0–34.0)
MCHC: 35.1 g/dL (ref 30.0–36.0)
MCV: 87.9 fL (ref 80.0–100.0)
Platelets: 248 10*3/uL (ref 150–400)
RBC: 4.28 MIL/uL (ref 3.87–5.11)
RDW: 12 % (ref 11.5–15.5)
WBC: 7 10*3/uL (ref 4.0–10.5)
nRBC: 0 % (ref 0.0–0.2)

## 2019-06-30 LAB — URINALYSIS, ROUTINE W REFLEX MICROSCOPIC
Bilirubin Urine: NEGATIVE
Glucose, UA: NEGATIVE mg/dL
Hgb urine dipstick: NEGATIVE
Ketones, ur: NEGATIVE mg/dL
Leukocytes,Ua: NEGATIVE
Nitrite: NEGATIVE
Protein, ur: NEGATIVE mg/dL
Specific Gravity, Urine: 1.006 (ref 1.005–1.030)
pH: 7 (ref 5.0–8.0)

## 2019-06-30 LAB — WET PREP, GENITAL
Clue Cells Wet Prep HPF POC: NONE SEEN
Sperm: NONE SEEN
Trich, Wet Prep: NONE SEEN
WBC, Wet Prep HPF POC: NONE SEEN
Yeast Wet Prep HPF POC: NONE SEEN

## 2019-06-30 LAB — HCG, QUANTITATIVE, PREGNANCY: hCG, Beta Chain, Quant, S: 15090 m[IU]/mL — ABNORMAL HIGH (ref <5)

## 2019-06-30 LAB — I-STAT BETA HCG BLOOD, ED (MC, WL, AP ONLY): I-stat hCG, quantitative: >2000 m[IU]/mL — ABNORMAL HIGH (ref <5)

## 2019-06-30 NOTE — ED Provider Notes (Signed)
MSE was initiated and I personally evaluated the patient and placed orders (if any) at  2:23 AM on June 30, 2019.  The patient appears stable so that the remainder of the MSE may be completed by another provider.  Patient is G4P2 at 5 weeks and 3 days by LMP presents with pain and spotting.  No confirmed IUP at this time.    NCAT EOMI RRR CTAB Abdomen is soft, NABS FROM  Case dw Dr. Shawnie Pons who will accept the patient in transfer   Kania Regnier, MD 06/30/19 0225

## 2019-06-30 NOTE — ED Notes (Signed)
Confirmed pregnancy per MAU provider. Dr. Nicanor Alcon evaluated pt, spoke with Dr. Shawnie Pons in MAU. Report given to [facility]. Pt transported via w/c to MAU.

## 2019-06-30 NOTE — MAU Provider Note (Signed)
History     CSN: [id number]  Arrival date and time: 06/30/19 0208   First Provider Initiated Contact with Patient 06/30/19 [phone]      Chief Complaint  Patient presents with  . Vaginal Bleeding   Shirley Baker is a 29 y.o. G4P3003 at [redacted]w[redacted]d by Definite LMP of 05/24/2019 who plans to receive care at [facility].  She presents today for Vaginal Bleeding.  She states she noticed some spotting today. She states she only notices the bleeding with urination and wiping, but does wear a pad.  She states the blood is bright pinkish.  She reports some cramping in her lower abdominal area that is constant, but "is not too bad."  Patient rates the pain a 3/10.  Patient endorses sexual activity in the past 3 days and reports "a lot of pain" with sex.  However, she denies problems with urination, constipation, diarrhea, or vomiting.     OB History    Gravida  4   Para  3   Term  3   Preterm      AB      Living  3     SAB      TAB      Ectopic      Multiple  0   Live Births  3        Obstetric Comments  LBW        Past Medical History:  Diagnosis Date  . Anemia   . Heart murmur   . Ovarian cyst   . Syncope     Past Surgical History:  Procedure Laterality Date  . NO PAST SURGERIES      Family History  Problem Relation Age of Onset  . Healthy Mother   . Healthy Father   . Hearing loss Neg Hx     Social History   Tobacco Use  . Smoking status: Never Smoker  . Smokeless tobacco: Never Used  . Tobacco comment: hookah, last Aug 2019  Substance Use Topics  . Alcohol use: No  . Drug use: No    Allergies: No Known Allergies  Medications Prior to Admission  Medication Sig Dispense Refill Last Dose  . Prenat w/o A Vit-FeFum-FePo-FA (CONCEPT OB) 130-92.4-1 MG CAPS TAKE ONE CAPSULE BY MOUTH DAILY 30 capsule 11 06/29/2019 at Unknown time  . topiramate (TOPAMAX) 25 MG tablet Take 1 tablet (25 mg total) by mouth daily. May increase by 1 (one) tablet every week for the  next 4 weeks with maximum dose of 100 mg. 30 tablet 1     Review of Systems  Constitutional: Negative for chills and fever.  Respiratory: Negative for cough and shortness of breath.   Gastrointestinal: Positive for abdominal pain and nausea. Negative for constipation, diarrhea and vomiting.  Genitourinary: Positive for dyspareunia and vaginal bleeding. Negative for difficulty urinating, dysuria, pelvic pain and vaginal discharge.  Neurological: Negative for light-headedness and headaches.   Physical Exam   Blood pressure 118/71, pulse 69, temperature 98.9 F (37.2 C), temperature source Oral, resp. rate 18, last menstrual period 05/24/2019, SpO2 100 %, not currently breastfeeding.  Physical Exam  Constitutional: She is oriented to person, place, and time. She appears well-developed and well-nourished. No distress.  HENT:  Head: Normocephalic and atraumatic.  Eyes: Conjunctivae are normal.  Cardiovascular: Normal rate, regular rhythm and normal heart sounds.  Respiratory: Effort normal and breath sounds normal.  GI: Bowel sounds are normal. There is no abdominal tenderness.  Genitourinary: Uterus is enlarged. Cervix exhibits  no motion tenderness, no discharge and no friability.    No vaginal discharge or bleeding.  No bleeding in the vagina.    Genitourinary Comments:  Speculum Exam: -Normal External Genitalia: Non tender, no apparent discharge at introitus.  -Vaginal Vault: Pink mucosa with good rugae. No apparent discharge -wet prep collected -Cervix:Pink, no lesions, cysts, or polyps.  Appears closed. No active bleeding from os-GC/CT collected -Bimanual Exam:  Uterus enlarged to ~10-12 weeks   Musculoskeletal:        General: Normal range of motion.     Cervical back: Normal range of motion.  Neurological: She is alert and oriented to person, place, and time.  Skin: Skin is warm and dry.  Psychiatric: She has a normal mood and affect. Her behavior is normal.    MAU Course   Procedures Results for orders placed or performed during the hospital encounter of 06/30/19 (from the past 24 hour(s))  I-Stat Beta hCG blood, ED ([facility], [facility], [facility] only)     Status: Abnormal   Collection Time: 06/30/19  2:32 AM  Result Value Ref Range   I-stat hCG, quantitative >2,000.0 (H) <5 mIU/mL   Comment 3          Urinalysis, Routine w reflex microscopic     Status: Abnormal   Collection Time: 06/30/19  3:36 AM  Result Value Ref Range   Color, Urine STRAW (A) YELLOW   APPearance CLEAR CLEAR   Specific Gravity, Urine 1.006 1.005 - 1.030   pH 7.0 5.0 - 8.0   Glucose, UA NEGATIVE NEGATIVE mg/dL   Hgb urine dipstick NEGATIVE NEGATIVE   Bilirubin Urine NEGATIVE NEGATIVE   Ketones, ur NEGATIVE NEGATIVE mg/dL   Protein, ur NEGATIVE NEGATIVE mg/dL   Nitrite NEGATIVE NEGATIVE   Leukocytes,Ua NEGATIVE NEGATIVE  Wet prep, genital     Status: None   Collection Time: 06/30/19  3:51 AM   Specimen: PATH Cytology Cervicovaginal Ancillary Only  Result Value Ref Range   Yeast Wet Prep HPF POC NONE SEEN NONE SEEN   Trich, Wet Prep NONE SEEN NONE SEEN   Clue Cells Wet Prep HPF POC NONE SEEN NONE SEEN   WBC, Wet Prep HPF POC NONE SEEN NONE SEEN   Sperm NONE SEEN   CBC     Status: None   Collection Time: 06/30/19  3:55 AM  Result Value Ref Range   WBC 7.0 4.0 - 10.5 K/uL   RBC 4.28 3.87 - 5.11 MIL/uL   Hemoglobin 13.2 12.0 - 15.0 g/dL   HCT 54.6 56.8 - 12.7 %   MCV 87.9 80.0 - 100.0 fL   MCH 30.8 26.0 - 34.0 pg   MCHC 35.1 30.0 - 36.0 g/dL   RDW 51.7 00.1 - 74.9 %   Platelets 248 150 - 400 K/uL   nRBC 0.0 0.0 - 0.2 %   US OB LESS THAN 14 WEEKS WITH OB TRANSVAGINAL  Result Date: 06/30/2019 CLINICAL DATA:  Initial evaluation for acute vaginal bleeding, early pregnancy. EXAM: OBSTETRIC <14 WK [place] AND TRANSVAGINAL OB US TECHNIQUE: Both transabdominal and transvaginal ultrasound examinations were performed for complete evaluation of the gestation as well as the maternal uterus, adnexal regions,  and pelvic cul-de-sac. Transvaginal technique was performed to assess early pregnancy. COMPARISON:  None. FINDINGS: Intrauterine gestational sac: Single Yolk sac:  Present Embryo:  Present Cardiac Activity: Present Heart Rate: 110 bpm CRL: 2.1 mm   5 w   5 d  [place] EDC: 02/25/2020 Subchorionic hemorrhage:  None visualized. Maternal uterus/adnexae: Ovaries are within normal limits. Trace free fluid within the pelvis, presumably physiologic. IMPRESSION: 1. Single viable intrauterine pregnancy as above, estimated gestational age [redacted] weeks and 5 days based on crown-rump length, with ultrasound EDC of 02/25/2020. No complication. 2. No other acute maternal uterine or adnexal abnormality identified. Electronically Signed   By: Jeannine Boga M.D.   On: 06/30/2019 04:28    MDM Pelvic Exam Labs: UA, Wet Prep, GC/CT [place] Assessment and Plan  29 year old G4P3003 at [facility] Vaginal Bleeding  -Reviewed POC with patient. -Exam performed and findings discussed.  -Patient offered and declines pain medication. -Cultures collected and pending.  -Will send for [place].  Maryann Conners 06/30/2019, 3:38 AM   Reassessment (4:51 AM) SIUP at 5.5 weeks  -[place] returns as above. -Wet prep negative -In room to discuss results with patient. -Informed that provider exam revealed uterine size that was larger, but no fibroids seen.  -Informed that uniform enlargement can occur esp.since she just had a baby in May of 2020. -Patient also informed this could be the cause of discomfort during sexual activity. -Discussed birth control options and patient strongly considering paragard IUD. -Informed could be placed after delivery.  Information sheet given. -Patient requests security transport to car. -Encouraged to call or return to MAU if symptoms worsen or with the onset of new symptoms. -Discharged to home in stable condition.  Maryann Conners MSN, CNM Advanced Practice Provider, [facility] for [employer]

## 2019-06-30 NOTE — Discharge Instructions (Signed)

## 2019-07-02 LAB — GC/CHLAMYDIA PROBE AMP (~~LOC~~) NOT AT ARMC
Chlamydia: NEGATIVE
Comment: NEGATIVE
Comment: NORMAL
Neisseria Gonorrhea: NEGATIVE

## 2019-08-03 ENCOUNTER — Telehealth: Payer: Self-pay | Admitting: Lactation Services

## 2019-08-03 MED ORDER — PRENATAL PLUS 27-1 MG PO TABS: 1.0000 | ORAL_TABLET | Freq: Every day | ORAL | 11 refills | Status: DC

## 2019-08-03 NOTE — Telephone Encounter (Signed)
Received a Pre authorization request for PNV. Called and spoke with patient. She reports that her PNV are ready for pickup today. She reports that she has been taking the Concept and has been told that it is no longer covered by Medicaid. She reports that she has itching and acne on this vitamin. Patient would like to try a PNV that is covered by Medicaid. New prescription sent.

## 2019-08-16 ENCOUNTER — Ambulatory Visit (INDEPENDENT_AMBULATORY_CARE_PROVIDER_SITE_OTHER): Payer: Medicaid Other | Admitting: *Deleted

## 2019-08-16 ENCOUNTER — Other Ambulatory Visit: Payer: Self-pay

## 2019-08-16 DIAGNOSIS — O0990 Supervision of high risk pregnancy, unspecified, unspecified trimester: Secondary | ICD-10-CM

## 2019-08-16 DIAGNOSIS — R011 Cardiac murmur, unspecified: Secondary | ICD-10-CM

## 2019-08-16 DIAGNOSIS — Z87898 Personal history of other specified conditions: Secondary | ICD-10-CM

## 2019-08-16 DIAGNOSIS — O09899 Supervision of other high risk pregnancies, unspecified trimester: Secondary | ICD-10-CM

## 2019-08-16 NOTE — Progress Notes (Signed)
I connected with  Shirley Baker on 08/16/19 at  9:30 AM EDT by telephone and verified that I am speaking with the correct person using two identifiers.   I discussed the limitations, risks, security and privacy concerns of performing an evaluation and management service by telephone and the availability of in person appointments. I also discussed with the patient that there may be a patient responsible charge related to this service. The patient expressed understanding and agreed to proceed.  I explained I am completing her New OB Intake today. We discussed Her EDD and that it is based on  sure LMP and confirmed with early [place] . I reviewed her allergies, meds, OB History, Medical /Surgical history, and had began her  screenings. I asked if she still had her  blood pressure cuff  From her previous pregnancy so that she can take blood pressures weekly during her pregnancy. She states she will look for it . I asked her to let us know if she cannot find it or if it is not working.  I also asked her to bring the blood pressure cuff with her to her first ob appointment if she needed a refresher on  how to use it. I explained she will have some visits in the office and some virtually. She already has MyChart and states has done at least one virtual visit in the past. Our call ended abruptly while I was doing her GAD7/ PHQ9 screenings. I called her back twice and left a message we were not quite done with our visit; but enough was completed. I left a message of her new ob appointment. I scheduled an [place] at 19 weeks   Emersyn Kotarski,RN 08/16/2019  9:23 AM

## 2019-08-16 NOTE — Patient Instructions (Signed)

## 2019-08-23 ENCOUNTER — Ambulatory Visit (INDEPENDENT_AMBULATORY_CARE_PROVIDER_SITE_OTHER): Payer: Medicaid Other | Admitting: Obstetrics and Gynecology

## 2019-08-23 ENCOUNTER — Other Ambulatory Visit: Payer: Self-pay

## 2019-08-23 VITALS — BP 120/80 | HR 76 | Wt 131.0 lb

## 2019-08-23 DIAGNOSIS — O09899 Supervision of other high risk pregnancies, unspecified trimester: Secondary | ICD-10-CM

## 2019-08-23 DIAGNOSIS — Z315 Encounter for genetic counseling: Secondary | ICD-10-CM | POA: Diagnosis not present

## 2019-08-23 DIAGNOSIS — O0990 Supervision of high risk pregnancy, unspecified, unspecified trimester: Secondary | ICD-10-CM | POA: Diagnosis not present

## 2019-08-23 DIAGNOSIS — Z3481 Encounter for supervision of other normal pregnancy, first trimester: Secondary | ICD-10-CM | POA: Diagnosis not present

## 2019-08-23 DIAGNOSIS — Z8759 Personal history of other complications of pregnancy, childbirth and the puerperium: Secondary | ICD-10-CM

## 2019-08-23 NOTE — Progress Notes (Signed)
History:   Shirley Baker is a 29 y.o. G4P3003 at [redacted]w[redacted]d by LMP being seen today for her first obstetrical visit.  Her obstetrical history is significant for short interval between pregnancies; baby is 30 months old. Patient does not intend to breast feed. Pregnancy history fully reviewed. Unplanned pregnancy, desires copper IUD. Happy and accepting of pregnancy.   Patient reports no complaints. HISTORY: OB History  Gravida Para Term Preterm AB Living  4 3 3  0 0 3  SAB TAB Ectopic Multiple Live Births  0 0 0 0 3    # Outcome Date GA Lbr Len/2nd Weight Sex Delivery Anes PTL Lv  4 Current           3 Term 10/07/18 [redacted]w[redacted]d  6 lb 4.9 oz (2.86 kg) M Vag-Spont EPI N LIV  2 Term 11/10/15 [redacted]w[redacted]d 25:23 / 00:03 4 lb 15.9 oz (2.265 kg) F Vag-Spont None  LIV     Birth Comments: wnl except sga     Name: Frigon,GIRL Ama     Apgar1: 8  Apgar5: 9  1 Term 07/01/10 [redacted]w[redacted]d  6 lb (2.722 kg) F Vag-Spont   LIV     Birth Comments: some high blood pressure during pregnancy, but not on meds    Obstetric Comments  LBW    Last pap smear was done 2019 and was normal  Past Medical History:  Diagnosis Date  . Anemia   . Heart murmur   . Ovarian cyst   . Syncope    Past Surgical History:  Procedure Laterality Date  . NO PAST SURGERIES     Family History  Problem Relation Age of Onset  . Healthy Mother   . Healthy Father   . Hearing loss Neg Hx    Social History   Tobacco Use  . Smoking status: Never Smoker  . Smokeless tobacco: Never Used  . Tobacco comment: hookah, last Aug 2019- patient denies 08/16/2019  Substance Use Topics  . Alcohol use: No  . Drug use: No   No Known Allergies Current Outpatient Medications on File Prior to Visit  Medication Sig Dispense Refill  . Prenat w/o A Vit-FeFum-FePo-FA (CONCEPT OB) 130-92.4-1 MG CAPS TAKE ONE CAPSULE BY MOUTH DAILY 30 capsule 11  . prenatal vitamin w/FE, FA (PRENATAL 1 + 1) 27-1 MG TABS tablet Take 1 tablet by mouth daily at 12 noon. 30 tablet  11   No current facility-administered medications on file prior to visit.    Review of Systems Pertinent items noted in HPI and remainder of comprehensive ROS otherwise negative. Physical Exam:   Vitals:   08/23/19 1029  BP: 120/80  Pulse: 76  Weight: 131 lb (59.4 kg)   Fetal Heart Rate (bpm): 136 System: General: well-developed, well-nourished female in no acute distress   Skin: normal coloration and turgor, no rashes   Neurologic: oriented, normal, negative, normal mood   Extremities: normal strength, tone, and muscle mass, ROM of all joints is normal   HEENT PERRLA, extraocular movement intact and sclera clear, anicteric   Mouth/Teeth mucous membranes moist, pharynx normal without lesions and dental hygiene good   Neck supple and no masses   Cardiovascular: regular rate and rhythm   Respiratory:  no respiratory distress, normal breath sounds   Abdomen: soft, non-tender; bowel sounds normal; no masses,  no organomegaly   Assessment:   Pregnancy: 08/25/19 Patient Active Problem List   Diagnosis Date Noted  . Supervision of high risk pregnancy, antepartum 08/16/2019  . Short  interval between pregnancies affecting pregnancy, antepartum 08/16/2019  . Heart murmur   . Sickle cell trait ([place]) 04/14/2018  . History of low birth weight 04/14/2018     Plan:   1. Supervision of high risk pregnancy, antepartum  - Genetic Screening - Obstetric Panel, Including HIV - Hemoglobin A1c - Culture, OB Urine - CHL AMB BABYSCRIPTS SCHEDULE OPTIMIZATION - Patient has BP cuff at home.   2. Short interval between pregnancies affecting pregnancy, antepartum  Unplanned, coping well.  Copper IUD PP  3. Previous baby was small for gestational age   Initial labs drawn. Continue prenatal vitamins. Genetic Screening discussed, First trimester screen and NIPS: requested. Ultrasound discussed; fetal anatomic survey: ordered. Problem list reviewed and updated. The nature of [place] with multiple MDs and other Advanced Practice Providers was explained to patient; also emphasized that residents, students are part of our team. Routine obstetric precautions reviewed. No follow-ups on file.     Cesar Rogerson, Artist Pais, [place] [employer], [place]

## 2019-08-24 LAB — OBSTETRIC PANEL, INCLUDING HIV
Antibody Screen: NEGATIVE
Basophils Absolute: 0 10*3/uL (ref 0.0–0.2)
Basos: 0 %
EOS (ABSOLUTE): 0.1 10*3/uL (ref 0.0–0.4)
Eos: 1 %
HIV Screen 4th Generation wRfx: NONREACTIVE
Hematocrit: 39 % (ref 34.0–46.6)
Hemoglobin: 13.2 g/dL (ref 11.1–15.9)
Hepatitis B Surface Ag: NEGATIVE
Immature Grans (Abs): 0 10*3/uL (ref 0.0–0.1)
Immature Granulocytes: 0 %
Lymphocytes Absolute: 1.6 10*3/uL (ref 0.7–3.1)
Lymphs: 24 %
MCH: 30.7 pg (ref 26.6–33.0)
MCHC: 33.8 g/dL (ref 31.5–35.7)
MCV: 91 fL (ref 79–97)
Monocytes Absolute: 0.5 10*3/uL (ref 0.1–0.9)
Monocytes: 8 %
Neutrophils Absolute: 4.4 10*3/uL (ref 1.4–7.0)
Neutrophils: 67 %
Platelets: 221 10*3/uL (ref 150–450)
RBC: 4.3 x10E6/uL (ref 3.77–5.28)
RDW: 12.7 % (ref 11.7–15.4)
RPR Ser Ql: NONREACTIVE
Rh Factor: POSITIVE
Rubella Antibodies, IGG: 8.22 index (ref >0.99)
WBC: 6.5 10*3/uL (ref 3.4–10.8)

## 2019-08-24 LAB — HEMOGLOBIN A1C
Est. average glucose Bld gHb Est-mCnc: 94 mg/dL
Hgb A1c MFr Bld: 4.9 % (ref 4.8–5.6)

## 2019-08-25 LAB — CULTURE, OB URINE

## 2019-08-25 LAB — URINE CULTURE, OB REFLEX: Organism ID, Bacteria: NO GROWTH

## 2019-09-04 ENCOUNTER — Encounter: Payer: Self-pay | Admitting: *Deleted

## 2019-09-09 ENCOUNTER — Encounter (HOSPITAL_COMMUNITY): Payer: Self-pay | Admitting: Obstetrics & Gynecology

## 2019-09-09 ENCOUNTER — Inpatient Hospital Stay (HOSPITAL_COMMUNITY)
Admission: AD | Admit: 2019-09-09 | Discharge: 2019-09-09 | Disposition: A | Discharge status: Home / Self Care | Payer: Medicaid Other | Attending: Obstetrics & Gynecology | Admitting: Obstetrics & Gynecology

## 2019-09-09 ENCOUNTER — Other Ambulatory Visit: Payer: Self-pay

## 2019-09-09 DIAGNOSIS — M25559 Pain in unspecified hip: Secondary | ICD-10-CM

## 2019-09-09 DIAGNOSIS — Z3A15 15 weeks gestation of pregnancy: Secondary | ICD-10-CM

## 2019-09-09 DIAGNOSIS — O0990 Supervision of high risk pregnancy, unspecified, unspecified trimester: Secondary | ICD-10-CM

## 2019-09-09 DIAGNOSIS — O26892 Other specified pregnancy related conditions, second trimester: Secondary | ICD-10-CM

## 2019-09-09 DIAGNOSIS — O99891 Other specified diseases and conditions complicating pregnancy: Secondary | ICD-10-CM | POA: Insufficient documentation

## 2019-09-09 DIAGNOSIS — O09899 Supervision of other high risk pregnancies, unspecified trimester: Secondary | ICD-10-CM

## 2019-09-09 DIAGNOSIS — M25552 Pain in left hip: Secondary | ICD-10-CM | POA: Insufficient documentation

## 2019-09-09 DIAGNOSIS — M25551 Pain in right hip: Secondary | ICD-10-CM | POA: Insufficient documentation

## 2019-09-09 DIAGNOSIS — R011 Cardiac murmur, unspecified: Secondary | ICD-10-CM

## 2019-09-09 LAB — URINALYSIS, ROUTINE W REFLEX MICROSCOPIC
Bilirubin Urine: NEGATIVE
Glucose, UA: NEGATIVE mg/dL
Hgb urine dipstick: NEGATIVE
Ketones, ur: NEGATIVE mg/dL
Leukocytes,Ua: NEGATIVE
Nitrite: NEGATIVE
Protein, ur: NEGATIVE mg/dL
Specific Gravity, Urine: 1.012 (ref 1.005–1.030)
pH: 9 — ABNORMAL HIGH (ref 5.0–8.0)

## 2019-09-09 MED ORDER — IBUPROFEN 600 MG PO TABS: 600.0000 mg | ORAL_TABLET | Freq: Four times a day (QID) | ORAL | 0 refills | Status: DC | PRN

## 2019-09-09 MED ORDER — COMFORT FIT MATERNITY SUPP MED MISC: 1.0000 | Freq: Every day | 0 refills | Status: DC

## 2019-09-09 MED ORDER — IBUPROFEN 600 MG PO TABS
600.0000 mg | ORAL_TABLET | Freq: Four times a day (QID) | ORAL | Status: DC | PRN
  Administered 2019-09-09: 21:00:00 600 mg via ORAL
  Filled 2019-09-09: qty 1

## 2019-09-09 NOTE — Discharge Instructions (Signed)
Hip Pain The hip is the joint between the upper legs and the lower pelvis. The bones, cartilage, tendons, and muscles of your hip joint support your body and allow you to move around. Hip pain can range from a minor ache to severe pain in one or both of your hips. The pain may be felt on the inside of the hip joint near the groin, or on the outside near the buttocks and upper thigh. You may also have swelling or stiffness in your hip area. Follow these instructions at home: Managing pain, stiffness, and swelling      If directed, put ice on the painful area. To do this: ? Put ice in a plastic bag. ? Place a towel between your skin and the bag. ? Leave the ice on for 20 minutes, 2-3 times a day.  If directed, apply heat to the affected area as often as told by your health care provider. Use the heat source that your health care provider recommends, such as a moist heat pack or a heating pad. ? Place a towel between your skin and the heat source. ? Leave the heat on for 20-30 minutes. ? Remove the heat if your skin turns bright red. This is especially important if you are unable to feel pain, heat, or cold. You may have a greater risk of getting burned. Activity  Do exercises as told by your health care provider.  Avoid activities that cause pain. General instructions   Take over-the-counter and prescription medicines only as told by your health care provider.  Keep a journal of your symptoms. Write down: ? How often you have hip pain. ? The location of your pain. ? What the pain feels like. ? What makes the pain worse.  Sleep with a pillow between your legs on your most comfortable side.  Keep all follow-up visits as told by your health care provider. This is important. Contact a health care provider if:  You cannot put weight on your leg.  Your pain or swelling continues or gets worse after one week.  It gets harder to walk.  You have a fever. Get help right away  if:  You fall.  You have a sudden increase in pain and swelling in your hip.  Your hip is red or swollen or very tender to touch. Summary  Hip pain can range from a minor ache to severe pain in one or both of your hips.  The pain may be felt on the inside of the hip joint near the groin, or on the outside near the buttocks and upper thigh.  Avoid activities that cause pain.  Write down how often you have hip pain, the location of the pain, what makes it worse, and what it feels like. This information is not intended to replace advice given to you by your health care provider. Make sure you discuss any questions you have with your health care provider. Document Revised: 10/02/2018 Document Reviewed: 10/02/2018 Elsevier Patient Education  [place]: You are not alone, Seventy-five percent of women have some sort of abdominal or back pain at some point in their pregnancy. Your baby is growing at a fast pace, which means that your whole body is rapidly trying to adjust to the changes. As your uterus grows, your back may start feeling a bit under stress and this can result in back or abdominal pain that can go from mild, and therefore bearable, to severe pains  that will not allow you to sit or lay down comfortably, When it comes to dealing with pregnancy-related pains and cramps, some pregnant women usually prefer natural remedies, which the market is filled with nowadays. For example, wearing a pregnancy support belt can help ease and lessen your discomfort and pain. WHAT ARE THE BENEFITS OF WEARING A PREGNANCY SUPPORT BELT? A pregnancy support belt provides support to the lower portion of the belly taking some of the weight of the growing uterus and distributing to the other parts of your body. It is designed make you comfortable and gives you extra support. Over the years, the pregnancy apparel market has been studying the needs and wants of pregnant women  and they have come up with the most comfortable pregnancy support belts that woman could ever ask for. In fact, you will no longer have to wear a stretched-out or bulky pregnancy belt that is visible underneath your clothes and makes you feel even more uncomfortable. Nowadays, a pregnancy support belt is made of comfortable and stretchy materials that will not irritate your skin but will actually make you feel at ease and you will not even notice you are wearing it. They are easy to put on and adjust during the day and can be worn at night for additional support.  BENEFITS: . Relives Back pain . Relieves Abdominal Muscle and Leg Pain . Stabilizes the Pelvic Ring . Offers a Cushioned Abdominal Lift Pad . Relieves pressure on the Sciatic Nerve Within Minutes WHERE TO GET YOUR PREGNANCY BELT: [employer] [phone] @2301  [address] [place], [place]

## 2019-09-09 NOTE — MAU Provider Note (Signed)
History     CSN: [id number]  Arrival date and time: 09/09/19 1954   Chief Complaint  Patient presents with  . Hip Pain   29 y.o. [id number] @15 .3 wks presenting with bilateral hip pain. Reports onset about 2 weeks ago. Pain is bilateral but worse on right. Describes as sharp when she walks and achy while she is sitting. No numbness or tingling. She has been using Tylenol which helps but it did not today. Rates pain 10/10. Denies any preceding injury or strenuous activities. Denies abdominal pain. No VB.    OB History    Gravida  4   Para  3   Term  3   Preterm      AB      Living  3     SAB      TAB      Ectopic      Multiple  0   Live Births  3        Obstetric Comments  LBW        Past Medical History:  Diagnosis Date  . Anemia   . Heart murmur   . Ovarian cyst   . Syncope     Past Surgical History:  Procedure Laterality Date  . NO PAST SURGERIES      Family History  Problem Relation Age of Onset  . Healthy Mother   . Healthy Father   . Hearing loss Neg Hx     Social History   Tobacco Use  . Smoking status: Never Smoker  . Smokeless tobacco: Never Used  . Tobacco comment: hookah, last Aug 2019- patient denies 08/16/2019  Substance Use Topics  . Alcohol use: No  . Drug use: No    Allergies: No Known Allergies  No medications prior to admission.    Review of Systems  Gastrointestinal: Negative for abdominal pain.  Genitourinary: Negative for dysuria, frequency, urgency, vaginal bleeding and vaginal discharge.  Musculoskeletal: Positive for arthralgias. Negative for back pain.   Physical Exam   Blood pressure 112/65, pulse 84, temperature 98.9 F (37.2 C), temperature source Oral, resp. rate 16, height 5\' 1"  (1.549 m), weight 59.9 kg, last menstrual period 05/24/2019, SpO2 98 %, not currently breastfeeding.  Physical Exam  Nursing note and vitals reviewed. Constitutional: She is oriented to person, place, and time. She appears  well-developed and well-nourished. No distress.  HENT:  Head: Normocephalic and atraumatic.  Cardiovascular: Normal rate.  Respiratory: Effort normal. No respiratory distress.  GI: Soft. She exhibits no distension and no mass. There is no abdominal tenderness. There is no rebound and no guarding.  Fundus u/4  Musculoskeletal:        General: Normal range of motion.     Cervical back: Normal range of motion.     Right hip: Tenderness (with adduction) present. No swelling, deformity or crepitus. Normal range of motion. Normal strength.     Left hip: Normal. No swelling, deformity, tenderness or crepitus. Normal range of motion. Normal strength.  Neurological: She is alert and oriented to person, place, and time.  Skin: Skin is warm and dry.  FHT 140  Results for orders placed or performed during the hospital encounter of 09/09/19 (from the past 24 hour(s))  Urinalysis, Routine w reflex microscopic     Status: Abnormal   Collection Time: 09/09/19  8:35 PM  Result Value Ref Range   Color, Urine YELLOW YELLOW   APPearance CLEAR CLEAR   Specific Gravity, Urine 1.012 1.005 - 1.030  pH 9.0 (H) 5.0 - 8.0   Glucose, UA NEGATIVE NEGATIVE mg/dL   Hgb urine dipstick NEGATIVE NEGATIVE   Bilirubin Urine NEGATIVE NEGATIVE   Ketones, ur NEGATIVE NEGATIVE mg/dL   Protein, ur NEGATIVE NEGATIVE mg/dL   Nitrite NEGATIVE NEGATIVE   Leukocytes,Ua NEGATIVE NEGATIVE   MAU Course  Procedures Ibuprofen 600mg - informed ok to use in second trimester  MDM Pain likely MSK d/t pregnancy compounded by mulitparity. Discussed comfort measures- heat, pregnancy belt, and Ibuprofen/Tylenol. Stable for discharge home.   Assessment and Plan  [redacted] weeks gestation Hip pain Discharge home Follow up at [facility] as scheduled Rx Ibuprofen Rx maternity belt Tylenol prn Heat prn  Allergies as of 09/09/2019   No Known Allergies     Medication List    STOP taking these medications   prenatal vitamin w/FE, FA  27-1 MG Tabs tablet     TAKE these medications   acetaminophen 500 MG tablet Commonly known as: TYLENOL Take 500 mg by mouth every 6 (six) hours as needed.   Comfort Fit Maternity Supp Med Misc 1 Device by Does not apply route daily.   Concept OB 130-92.4-1 MG Caps TAKE ONE CAPSULE BY MOUTH DAILY   ibuprofen 600 MG tablet Commonly known as: ADVIL Take 1 tablet (600 mg total) by mouth every 6 (six) hours as needed for moderate pain. What changed:   medication strength  how much to take  reasons to take this       11/09/2019, CNM 09/09/2019, 9:28 PM

## 2019-09-09 NOTE — MAU Note (Signed)
Hip pain -both hips x 1 month.  No bleeding. No leaking.

## 2019-09-12 ENCOUNTER — Encounter: Payer: Self-pay | Admitting: *Deleted

## 2019-09-20 ENCOUNTER — Encounter (INDEPENDENT_AMBULATORY_CARE_PROVIDER_SITE_OTHER): Payer: Medicaid Other | Admitting: Obstetrics and Gynecology

## 2019-09-20 NOTE — Progress Notes (Signed)
817am no answer LVM  831am  LVM to reschedule appointment

## 2019-09-20 NOTE — Progress Notes (Signed)
Patient did not keep her appointment today.   Duane Lope, NP 09/20/2019 4:09 PM

## 2019-10-04 ENCOUNTER — Other Ambulatory Visit: Payer: Self-pay

## 2019-10-04 ENCOUNTER — Ambulatory Visit: Payer: Medicaid Other | Admitting: *Deleted

## 2019-10-04 ENCOUNTER — Other Ambulatory Visit: Payer: Self-pay | Admitting: *Deleted

## 2019-10-04 ENCOUNTER — Ambulatory Visit (HOSPITAL_COMMUNITY): Payer: Medicaid Other | Attending: Obstetrics and Gynecology

## 2019-10-04 DIAGNOSIS — Z3A19 19 weeks gestation of pregnancy: Secondary | ICD-10-CM

## 2019-10-04 DIAGNOSIS — O09892 Supervision of other high risk pregnancies, second trimester: Secondary | ICD-10-CM

## 2019-10-04 DIAGNOSIS — O0990 Supervision of high risk pregnancy, unspecified, unspecified trimester: Secondary | ICD-10-CM

## 2019-10-04 DIAGNOSIS — O09899 Supervision of other high risk pregnancies, unspecified trimester: Secondary | ICD-10-CM | POA: Diagnosis not present

## 2019-10-04 DIAGNOSIS — Z363 Encounter for antenatal screening for malformations: Secondary | ICD-10-CM | POA: Diagnosis not present

## 2019-10-04 DIAGNOSIS — Z862 Personal history of diseases of the blood and blood-forming organs and certain disorders involving the immune mechanism: Secondary | ICD-10-CM

## 2019-10-04 DIAGNOSIS — R011 Cardiac murmur, unspecified: Secondary | ICD-10-CM

## 2019-10-04 DIAGNOSIS — Z87898 Personal history of other specified conditions: Secondary | ICD-10-CM | POA: Diagnosis not present

## 2019-10-04 DIAGNOSIS — O09292 Supervision of pregnancy with other poor reproductive or obstetric history, second trimester: Secondary | ICD-10-CM | POA: Diagnosis not present

## 2019-10-04 DIAGNOSIS — Z362 Encounter for other antenatal screening follow-up: Secondary | ICD-10-CM

## 2019-10-05 ENCOUNTER — Ambulatory Visit (INDEPENDENT_AMBULATORY_CARE_PROVIDER_SITE_OTHER): Payer: Medicaid Other | Admitting: Nurse Practitioner

## 2019-10-05 VITALS — BP 123/73 | HR 91 | Wt 136.0 lb

## 2019-10-05 DIAGNOSIS — O99891 Other specified diseases and conditions complicating pregnancy: Secondary | ICD-10-CM

## 2019-10-05 DIAGNOSIS — M25559 Pain in unspecified hip: Secondary | ICD-10-CM

## 2019-10-05 DIAGNOSIS — Z3A19 19 weeks gestation of pregnancy: Secondary | ICD-10-CM

## 2019-10-05 DIAGNOSIS — O99012 Anemia complicating pregnancy, second trimester: Secondary | ICD-10-CM

## 2019-10-05 DIAGNOSIS — O0990 Supervision of high risk pregnancy, unspecified, unspecified trimester: Secondary | ICD-10-CM | POA: Diagnosis not present

## 2019-10-05 DIAGNOSIS — D573 Sickle-cell trait: Secondary | ICD-10-CM

## 2019-10-05 DIAGNOSIS — O219 Vomiting of pregnancy, unspecified: Secondary | ICD-10-CM

## 2019-10-05 MED ORDER — DOXYLAMINE-PYRIDOXINE 10-10 MG PO TBEC: DELAYED_RELEASE_TABLET | ORAL | 2 refills | Status: DC

## 2019-10-05 NOTE — Progress Notes (Signed)
Not sleeping  Eating less and less  Forcing self to eat then vomiting   U/S yesterday went well. F/u in one month

## 2019-10-05 NOTE — Patient Instructions (Signed)
Morning Sickness ° °Morning sickness is when you feel sick to your stomach (nauseous) during pregnancy. You may feel sick to your stomach and throw up (vomit). You may feel sick in the morning, but you can feel this way at any time of day. Some women feel very sick to their stomach and cannot stop throwing up (hyperemesis gravidarum). °Follow these instructions at home: °Medicines °· Take over-the-counter and prescription medicines only as told by your doctor. Do not take any medicines until you talk with your doctor about them first. °· Taking multivitamins before getting pregnant can stop or lessen the harshness of morning sickness. °Eating and drinking °· Eat dry toast or crackers before getting out of bed. °· Eat 5 or 6 small meals a day. °· Eat dry and bland foods like rice and baked potatoes. °· Do not eat greasy, fatty, or spicy foods. °· Have someone cook for you if the smell of food causes you to feel sick or throw up. °· If you feel sick to your stomach after taking prenatal vitamins, take them at night or with a snack. °· Eat protein when you need a snack. Nuts, yogurt, and cheese are good choices. °· Drink fluids throughout the day. °· Try ginger ale made with real ginger, ginger tea made from fresh grated ginger, or ginger candies. °General instructions °· Do not use any products that have nicotine or tobacco in them, such as cigarettes and e-cigarettes. If you need help quitting, ask your doctor. °· Use an air purifier to keep the air in your house free of smells. °· Get lots of fresh air. °· Try to avoid smells that make you feel sick. °· Try: °? Wearing a bracelet that is used for seasickness (acupressure wristband). °? Going to a doctor who puts thin needles into certain body points (acupuncture) to improve how you feel. °Contact a doctor if: °· You need medicine to feel better. °· You feel dizzy or light-headed. °· You are losing weight. °Get help right away if: °· You feel very sick to your  stomach and cannot stop throwing up. °· You pass out (faint). °· You have very bad pain in your belly. °Summary °· Morning sickness is when you feel sick to your stomach (nauseous) during pregnancy. °· You may feel sick in the morning, but you can feel this way at any time of day. °· Making some changes to what you eat may help your symptoms go away. °This information is not intended to replace advice given to you by your health care provider. Make sure you discuss any questions you have with your health care provider. °Document Revised: 04/29/2017 Document Reviewed: 06/17/2016 °Elsevier Patient Education © 2020 Elsevier Inc. ° °

## 2019-10-05 NOTE — Progress Notes (Signed)
    Subjective:  Shirley Baker is a 29 y.o. G4P3003 at [redacted]w[redacted]d being seen today for ongoing prenatal care.  She is currently monitored for the following issues for this high-risk pregnancy and has Sickle cell trait (HCC); History of low birth weight; Nausea and vomiting in pregnancy; Supervision of high risk pregnancy, antepartum; Short interval between pregnancies affecting pregnancy, antepartum; and Heart murmur on their problem list.  Patient reports not sleeping well, not eating well, some vomiting, low appetite.  Contractions: Not present. Vag. Bleeding: None.  Movement: Absent. Denies leaking of fluid.   The following portions of the patient's history were reviewed and updated as appropriate: allergies, current medications, past family history, past medical history, past social history, past surgical history and problem list. Problem list updated.  Objective:   Vitals:   10/05/19 0929  BP: 123/73  Pulse: 91  Weight: 136 lb (61.7 kg)    Fetal Status: Fetal Heart Rate (bpm): 140 Fundal Height: 23 cm Movement: Absent     General:  Alert, oriented and cooperative. Patient is in no acute distress.  Skin: Skin is warm and dry. No rash noted.   Cardiovascular: Normal heart rate noted  Respiratory: Normal respiratory effort, no problems with respiration noted  Abdomen: Soft, gravid, appropriate for gestational age. Pain/Pressure: Present     Pelvic:  Cervical exam deferred        Extremities: Normal range of motion.  Edema: None  Mental Status: Normal mood and affect. Normal behavior. Normal judgment and thought content.   Urinalysis:      Assessment and Plan:  Pregnancy: G4P3003 at [redacted]w[redacted]d  1. Supervision of high risk pregnancy, antepartum Had [place] yesterday and reviewed with client. Is aware that she and her partner have sickle cell trait and currently one child with sickle cell disease.  2. Hip pain Has been taking ibuprofen for the pain.  Advised to switch to tylenol and will refer  to PT to for evaluation and nonpharmacologic pain management.  - Ambulatory referral to Physical Therapy  3. Nausea and vomiting in pregnancy Prescribed Diclegis and reviewed how to take it daily to help her with nausea and vomiting. Reviewed eating small amounts more frequently.  Important to eat to maintain energy level.  Include protein every time you eat.  Preterm labor symptoms and general obstetric precautions including but not limited to vaginal bleeding, contractions, leaking of fluid and fetal movement were reviewed in detail with the patient. Please refer to After Visit Summary for other counseling recommendations.  Return in about 4 weeks (around 11/02/2019) for virtual ROB.  Nolene Bernheim, RN, MSN, NP-BC Nurse Practitioner, [facility] for [employer], [facility] 10/05/2019 12:25 PM

## 2019-10-11 LAB — AFP, SERUM, OPEN SPINA BIFIDA
AFP MoM: 1.1
AFP Value: 66.5 ng/mL
Gest. Age on Collection Date: 19.1 weeks
Maternal Age At EDD: 30.1 yr
OSBR Risk 1 IN: 10000
Test Results:: NEGATIVE
Weight: 136 [lb_av]

## 2019-10-16 ENCOUNTER — Encounter: Payer: Self-pay | Admitting: Physical Therapy

## 2019-10-16 ENCOUNTER — Other Ambulatory Visit: Payer: Self-pay

## 2019-10-16 ENCOUNTER — Ambulatory Visit: Payer: Medicaid Other | Attending: Nurse Practitioner | Admitting: Physical Therapy

## 2019-10-16 DIAGNOSIS — R2689 Other abnormalities of gait and mobility: Secondary | ICD-10-CM | POA: Diagnosis not present

## 2019-10-16 DIAGNOSIS — M6281 Muscle weakness (generalized): Secondary | ICD-10-CM | POA: Diagnosis not present

## 2019-10-16 DIAGNOSIS — Z331 Pregnant state, incidental: Secondary | ICD-10-CM | POA: Insufficient documentation

## 2019-10-16 DIAGNOSIS — M25551 Pain in right hip: Secondary | ICD-10-CM | POA: Diagnosis not present

## 2019-10-16 NOTE — Therapy (Signed)
[facility] [address], [address] [place], [place], [address] Phone: [phone]   Fax:  [phone]  Physical Therapy Evaluation  Patient Details  Name: Shirley Baker MRN: [id number] Date of Birth: 08/31/1990 Referring Provider (PT): Nolene Bernheim, NP   Encounter Date: 10/16/2019  PT End of Session - 10/16/19 1054    Visit Number  1    Date for PT Re-Evaluation  01/08/20    Authorization Type  Medicaid    PT Start Time  0835    PT Stop Time  0915    PT Time Calculation (min)  40 min    Activity Tolerance  Patient tolerated treatment well;No increased pain    Behavior During Therapy  WFL for tasks assessed/performed       Past Medical History:  Diagnosis Date  . Anemia   . Heart murmur   . Ovarian cyst   . Syncope     Past Surgical History:  Procedure Laterality Date  . NO PAST SURGERIES      There were no vitals filed for this visit.   Subjective Assessment - 10/16/19 0841    Subjective  Patient is 21 weeks. Patient is due 02/28/2020. Patient reports pain in bil. hips but mostly on the right. HIp pain started end of first trimester, end of march. Pain was sudden.    Patient Stated Goals  relieve hip pain    Currently in Pain?  Yes    Pain Score  7    high is 10/10   Pain Location  Hip    Pain Orientation  Right;Left   right worse   Pain Descriptors / Indicators  Aching;Sharp    Pain Type  Acute pain    Pain Radiating Towards  starts from hip to lumbar    Pain Onset  More than a month ago    Pain Frequency  Intermittent    Aggravating Factors   walking, going up and down curb    Pain Relieving Factors  sit still    Multiple Pain Sites  No         [facility] PT Assessment - 10/16/19 0001      Assessment   Medical Diagnosis  M25.559 Hip pain    Referring Provider (PT)  Nolene Bernheim, NP    Onset Date/Surgical Date  08/29/19    Prior Therapy  none      Precautions   Precautions  Other (comment)     Precaution Comments  pregnant      Restrictions   Weight Bearing Restrictions  No      Balance Screen   Has the patient fallen in the past 6 months  No    Has the patient had a decrease in activity level because of a fear of falling?   No    Is the patient reluctant to leave their home because of a fear of falling?   No      Home Public house manager residence      Prior Function   Level of Independence  Independent      Cognition   Overall Cognitive Status  Within Functional Limits for tasks assessed      ROM / Strength   AROM / PROM / Strength  AROM;PROM;Strength      AROM   Lumbar - Left Side Bend  decreased by 25% with pain on the right      Strength   Right Hip ABduction  3/5    Right Hip ADduction  3/5    Left Hip ABduction  4/5    Left Hip ADduction  4/5      Palpation   SI assessment   right ilium is rotated anteriorly    Palpation comment  tenderness located in right quadratus      Trendelenburg Test   Findings  Positive    Side  Left    Comments  right hip drops                  Objective measurements completed on examination: See above findings.    Pelvic Floor Special Questions - 10/16/19 0001    Prior Pregnancies  Yes    Number of Pregnancies  3    Number of Vaginal Deliveries  3    Any difficulty with labor and deliveries  No    Urinary Leakage  No               PT Education - 10/16/19 0911    Education Details  Access Code: [email or web address]    Person(s) Educated  Patient    Methods  Explanation;Demonstration;Verbal cues;Handout    Comprehension  Verbalized understanding;Returned demonstration       PT Short Term Goals - 10/16/19 1055      PT SHORT TERM GOAL #1   Title  independent with initial HEP    Baseline  not educated yet    Period  Weeks    Status  New    Target Date  11/13/19        PT Long Term Goals - 10/16/19 1056      PT LONG TERM GOAL #1   Title  independent with HEP    Time  12     Period  Weeks    Status  New    Target Date  01/08/20      PT LONG TERM GOAL #2   Title  able to walk with full weightbear on the right due to pelvis in correct alignment    Baseline  right ilium is anteriorly rotated    Time  12    Period  Weeks    Status  New    Target Date  01/08/20      PT LONG TERM GOAL #3   Title  able to go up and down stairs and walk with pain decreased </= 2/10    Baseline  pain level is 6-10/10    Time  12    Period  Weeks    Status  New    Target Date  01/08/20      PT LONG TERM GOAL #4   Title  understand correct body mechanics and ways to lay down to put less strain on her back and pelvis    Baseline  not educated yet    Time  12    Period  Weeks    Target Date  01/08/20             Plan - 10/16/19 0911    Clinical Impression Statement  Patient is a 29 year old female with hip pain, right worse than the left. Patient reports her hip pain began suddenly at the end of [facility]. Internittent pain level at 7-10/10 with movement, walking, stairs. When patient is sitting there is no pain. Left sidebending decreased by 25% with pain on the left lumbar. Positive Trendelenberg on the left with right hip dropping. Weakness in hip abductors with right worse  than the left. Decreased movement of L3 and L4 vertebrae. Palpable tenderness located in the righ quadratus and thoracic lumbar paraspinals. Patient reports no urinary leakage. Patient walks with decreased weightbear on the right. Patient will benefit from skilled therapy to reduce her pain and improve function.    Personal Factors and Comorbidities  Comorbidity 2    Comorbidities  Sickle cell trait; last child born on 10/07/2018; Patient is due 02/28/2020    Examination-Activity Limitations  Transfers;Stand;Stairs;Bed Mobility;Locomotion Level    Examination-Participation Restrictions  Community Activity;Cleaning    Stability/Clinical Decision Making  Evolving/Moderate complexity    Clinical Decision  Making  Low    Rehab Potential  Excellent    PT Frequency  1x / week    PT Duration  12 weeks    PT Treatment/Interventions  Cryotherapy;Moist Heat;Gait training;Therapeutic activities;Therapeutic exercise;Neuromuscular re-education;Patient/family education;Manual techniques;Taping;Spinal Manipulations    PT Next Visit Plan  check pelvic aliignment, hip abduction and adduction strength, body mechanics, soft tissue work to [employer] tlumbar paraspinals, back strength    PT Home Exercise Plan  Access Code: [email or web address]    Consulted and Agree with Plan of Care  Patient       Patient will benefit from skilled therapeutic intervention in order to improve the following deficits and impairments:  Pain, Decreased activity tolerance, Difficulty walking, Increased muscle spasms, Decreased strength  Visit Diagnosis: Pain in right hip - Plan: PT plan of care cert/re-cert  Muscle weakness (generalized) - Plan: PT plan of care cert/re-cert  Other abnormalities of gait and mobility - Plan: PT plan of care cert/re-cert     Problem List Patient Active Problem List   Diagnosis Date Noted  . Supervision of high risk pregnancy, antepartum 08/16/2019  . Short interval between pregnancies affecting pregnancy, antepartum 08/16/2019  . Heart murmur   . Sickle cell trait (HCC) 04/14/2018  . History of low birth weight 04/14/2018  . Nausea and vomiting in pregnancy 04/14/2018    Eulis Foster, PT 10/16/19 11:01 AM   [facility] [address] [place], [place], [address] Phone: [phone]   Fax:  [phone]  Name: Michaela Broski MRN: [id number] Date of Birth: 1991/01/18

## 2019-10-16 NOTE — Patient Instructions (Signed)
Access Code: [email or web address] URL: [email or web address] Date: 10/16/2019 Prepared by: Eulis Foster  Exercises Quadruped Cat Camel - 2 x daily - 7 x weekly - 1 sets - 10 reps Child's Pose Stretch - 1 x daily - 7 x weekly - 1 sets - 1 reps - 30 sec hold Seated Piriformis Stretch - 1 x daily - 7 x weekly - 1 sets - 2 reps - 30 sec hold [facility] [address], [address] [place], [place] [address] Phone # [phone] Fax [phone]

## 2019-10-18 ENCOUNTER — Telehealth: Payer: Medicaid Other | Admitting: Obstetrics and Gynecology

## 2019-10-30 ENCOUNTER — Ambulatory Visit: Payer: Medicaid Other | Attending: Physical Therapy | Admitting: Physical Therapy

## 2019-10-30 ENCOUNTER — Telehealth: Payer: Self-pay | Admitting: Physical Therapy

## 2019-10-30 NOTE — Telephone Encounter (Signed)
Called patient about her 8:30AM appointment she no-showed. Patient reports she overslept. She will be at her next appointment.  Eulis Foster, PT @6 /06/2019@ 8:52 AM

## 2019-11-02 ENCOUNTER — Other Ambulatory Visit: Payer: Self-pay | Admitting: *Deleted

## 2019-11-02 ENCOUNTER — Ambulatory Visit: Payer: Medicaid Other | Attending: Obstetrics and Gynecology

## 2019-11-02 ENCOUNTER — Other Ambulatory Visit: Payer: Self-pay

## 2019-11-02 ENCOUNTER — Telehealth (INDEPENDENT_AMBULATORY_CARE_PROVIDER_SITE_OTHER): Payer: Medicaid Other | Admitting: Medical

## 2019-11-02 ENCOUNTER — Encounter: Payer: Self-pay | Admitting: Medical

## 2019-11-02 DIAGNOSIS — Z862 Personal history of diseases of the blood and blood-forming organs and certain disorders involving the immune mechanism: Secondary | ICD-10-CM | POA: Diagnosis not present

## 2019-11-02 DIAGNOSIS — O0990 Supervision of high risk pregnancy, unspecified, unspecified trimester: Secondary | ICD-10-CM

## 2019-11-02 DIAGNOSIS — D573 Sickle-cell trait: Secondary | ICD-10-CM

## 2019-11-02 DIAGNOSIS — Z3A23 23 weeks gestation of pregnancy: Secondary | ICD-10-CM

## 2019-11-02 DIAGNOSIS — Z362 Encounter for other antenatal screening follow-up: Secondary | ICD-10-CM | POA: Diagnosis not present

## 2019-11-02 DIAGNOSIS — O09899 Supervision of other high risk pregnancies, unspecified trimester: Secondary | ICD-10-CM

## 2019-11-02 DIAGNOSIS — O219 Vomiting of pregnancy, unspecified: Secondary | ICD-10-CM

## 2019-11-02 DIAGNOSIS — O09292 Supervision of pregnancy with other poor reproductive or obstetric history, second trimester: Secondary | ICD-10-CM

## 2019-11-02 DIAGNOSIS — Z87898 Personal history of other specified conditions: Secondary | ICD-10-CM

## 2019-11-02 DIAGNOSIS — O09892 Supervision of other high risk pregnancies, second trimester: Secondary | ICD-10-CM | POA: Diagnosis not present

## 2019-11-02 DIAGNOSIS — R011 Cardiac murmur, unspecified: Secondary | ICD-10-CM

## 2019-11-02 DIAGNOSIS — O99891 Other specified diseases and conditions complicating pregnancy: Secondary | ICD-10-CM

## 2019-11-02 DIAGNOSIS — O09293 Supervision of pregnancy with other poor reproductive or obstetric history, third trimester: Secondary | ICD-10-CM

## 2019-11-02 NOTE — Progress Notes (Signed)
Called pt at [phone]; pt states she is leaving the building from [place] to go home. I explained I cannot check pt in while she is driving. Pt has to be home so that her fiance can leave the house. I stated I will call the pt again in 15 minutes.  I connected with  Eulogio Bear on 11/02/19 at 0932 by telephone and verified that I am speaking with the correct person using two identifiers.   I discussed the limitations, risks, security and privacy concerns of performing an evaluation and management service by telephone and the availability of in person appointments. I also discussed with the patient that there may be a patient responsible charge related to this service. The patient expressed understanding and agreed to proceed.  Marjo Bicker, RN 11/02/2019  9:32 AM

## 2019-11-02 NOTE — Patient Instructions (Signed)
[facility]   [address] [place], [place], [place] [address] Phone: [phone]  Monday     8:30AM-5PM Tuesday 8:30AM-5PM Wednesday 8:30AM-5PM Thursday 8:30AM-5PM Friday  8:30AM-5PM Saturday Closed Sunday Closed  Round Ligament Pain  The round ligament is a cord of muscle and tissue that helps support the uterus. It can become a source of pain during pregnancy if it becomes stretched or twisted as the baby grows. The pain usually begins in the second trimester (13-28 weeks) of pregnancy, and it can come and go until the baby is delivered. It is not a serious problem, and it does not cause harm to the baby. Round ligament pain is usually a short, sharp, and pinching pain, but it can also be a dull, lingering, and aching pain. The pain is felt in the lower side of the abdomen or in the groin. It usually starts deep in the groin and moves up to the outside of the hip area. The pain may occur when you:  Suddenly change position, such as quickly going from a sitting to standing position.  Roll over in bed.  Cough or sneeze.  Do physical activity. Follow these instructions at home:   Watch your condition for any changes.  When the pain starts, relax. Then try any of these methods to help with the pain: ? Sitting down. ? Flexing your knees up to your abdomen. ? Lying on your side with one pillow under your abdomen and another pillow between your legs. ? Sitting in a warm bath for 15-20 minutes or until the pain goes away.  Take over-the-counter and prescription medicines only as told by your health care provider.  Move slowly when you sit down or stand up.  Avoid long walks if they cause pain.  Stop or reduce your physical activities if they cause pain.  Keep all follow-up visits as told by your health care provider. This is important. Contact a health care provider if:  Your pain does not go away with treatment.  You feel pain in your back that you did not  have before.  Your medicine is not helping. Get help right away if:  You have a fever or chills.  You develop uterine contractions.  You have vaginal bleeding.  You have nausea or vomiting.  You have diarrhea.  You have pain when you urinate. Summary  Round ligament pain is felt in the lower abdomen or groin. It is usually a short, sharp, and pinching pain. It can also be a dull, lingering, and aching pain.  This pain usually begins in the second trimester (13-28 weeks). It occurs because the uterus is stretching with the growing baby, and it is not harmful to the baby.  You may notice the pain when you suddenly change position, when you cough or sneeze, or during physical activity.  Relaxing, flexing your knees to your abdomen, lying on one side, or taking a warm bath may help to get rid of the pain.  Get help from your health care provider if the pain does not go away or if you have vaginal bleeding, nausea, vomiting, diarrhea, or painful urination. This information is not intended to replace advice given to you by your health care provider. Make sure you discuss any questions you have with your health care provider. Document Revised: 11/02/2017 Document Reviewed: 11/02/2017 Elsevier Patient Education  2020 [employer].  Second Trimester of Pregnancy  The second trimester is from week 14 through week 27 (month 4 through  6). This is often the time in pregnancy that you feel your best. Often times, morning sickness has lessened or quit. You may have more energy, and you may get hungry more often. Your unborn baby is growing rapidly. At the end of the sixth month, he or she is about 9 inches long and weighs about 1 pounds. You will likely feel the baby move between 18 and 20 weeks of pregnancy. Follow these instructions at home: Medicines  Take over-the-counter and prescription medicines only as told by your doctor. Some medicines are safe and some medicines are not safe during  pregnancy.  Take a prenatal vitamin that contains at least 600 micrograms (mcg) of folic acid.  If you have trouble pooping (constipation), take medicine that will make your stool soft (stool softener) if your doctor approves. Eating and drinking   Eat regular, healthy meals.  Avoid raw meat and uncooked cheese.  If you get low calcium from the food you eat, talk to your doctor about taking a daily calcium supplement.  Avoid foods that are high in fat and sugars, such as fried and sweet foods.  If you feel sick to your stomach (nauseous) or throw up (vomit): ? Eat 4 or 5 small meals a day instead of 3 large meals. ? Try eating a few soda crackers. ? Drink liquids between meals instead of during meals.  To prevent constipation: ? Eat foods that are high in fiber, like fresh fruits and vegetables, whole grains, and beans. ? Drink enough fluids to keep your pee (urine) clear or pale yellow. Activity  Exercise only as told by your doctor. Stop exercising if you start to have cramps.  Do not exercise if it is too hot, too humid, or if you are in a place of great height (high altitude).  Avoid heavy lifting.  Wear low-heeled shoes. Sit and stand up straight.  You can continue to have sex unless your doctor tells you not to. Relieving pain and discomfort  Wear a good support bra if your breasts are tender.  Take warm water baths (sitz baths) to soothe pain or discomfort caused by hemorrhoids. Use hemorrhoid cream if your doctor approves.  Rest with your legs raised if you have leg cramps or low back pain.  If you develop puffy, bulging veins (varicose veins) in your legs: ? Wear support hose or compression stockings as told by your doctor. ? Raise (elevate) your feet for 15 minutes, 3-4 times a day. ? Limit salt in your food. Prenatal care  Write down your questions. Take them to your prenatal visits.  Keep all your prenatal visits as told by your doctor. This is  important. Safety  Wear your seat belt when driving.  Make a list of emergency phone numbers, including numbers for family, friends, the hospital, and police and fire departments. General instructions  Ask your doctor about the right foods to eat or for help finding a counselor, if you need these services.  Ask your doctor about local prenatal classes. Begin classes before month 6 of your pregnancy.  Do not use hot tubs, steam rooms, or saunas.  Do not douche or use tampons or scented sanitary pads.  Do not cross your legs for long periods of time.  Visit your dentist if you have not done so. Use a soft toothbrush to brush your teeth. Floss gently.  Avoid all smoking, herbs, and alcohol. Avoid drugs that are not approved by your doctor.  Do not use any products  that contain nicotine or tobacco, such as cigarettes and e-cigarettes. If you need help quitting, ask your doctor.  Avoid cat litter boxes and soil used by cats. These carry germs that can cause birth defects in the baby and can cause a loss of your baby (miscarriage) or stillbirth. Contact a doctor if:  You have mild cramps or pressure in your lower belly.  You have pain when you pee (urinate).  You have bad smelling fluid coming from your vagina.  You continue to feel sick to your stomach (nauseous), throw up (vomit), or have watery poop (diarrhea).  You have a nagging pain in your belly area.  You feel dizzy. Get help right away if:  You have a fever.  You are leaking fluid from your vagina.  You have spotting or bleeding from your vagina.  You have severe belly cramping or pain.  You lose or gain weight rapidly.  You have trouble catching your breath and have chest pain.  You notice sudden or extreme puffiness (swelling) of your face, hands, ankles, feet, or legs.  You have not felt the baby move in over an hour.  You have severe headaches that do not go away when you take medicine.  You have  trouble seeing. Summary  The second trimester is from week 14 through week 27 (months 4 through 6). This is often the time in pregnancy that you feel your best.  To take care of yourself and your unborn baby, you will need to eat healthy meals, take medicines only if your doctor tells you to do so, and do activities that are safe for you and your baby.  Call your doctor if you get sick or if you notice anything unusual about your pregnancy. Also, call your doctor if you need help with the right food to eat, or if you want to know what activities are safe for you. This information is not intended to replace advice given to you by your health care provider. Make sure you discuss any questions you have with your health care provider. Document Revised: 09/08/2018 Document Reviewed: 06/22/2016 Elsevier Patient Education  2020 [employer].

## 2019-11-02 NOTE — Progress Notes (Signed)
I connected with Shirley Baker on 11/02/19 at  9:15 AM EDT by: MyChart video and verified that I am speaking with the correct person using two identifiers.  Patient is located at home and provider is located at [facility].     The purpose of this virtual visit is to provide medical care while limiting exposure to the novel coronavirus. I discussed the limitations, risks, security and privacy concerns of performing an evaluation and management service by MyChart video and the availability of in person appointments. I also discussed with the patient that there may be a patient responsible charge related to this service. By engaging in this virtual visit, you consent to the provision of healthcare.  Additionally, you authorize for your insurance to be billed for the services provided during this visit.  The patient expressed understanding and agreed to proceed.  The following staff members participated in the virtual visit:  Maxwell Marion, RN    PRENATAL VISIT NOTE  Subjective:  Shirley Baker is a 29 y.o. G4P3003 at [redacted]w[redacted]d  for Mychart video visit for ongoing prenatal care.  She is currently monitored for the following issues for this high-risk pregnancy and has Sickle cell trait (HCC); History of low birth weight; Nausea and vomiting in pregnancy; Supervision of high risk pregnancy, antepartum; Short interval between pregnancies affecting pregnancy, antepartum; and Heart murmur on their problem list.  Patient reports no complaints.  Contractions: Not present. Vag. Bleeding: None.  Movement: Present. Denies leaking of fluid.   The following portions of the patient's history were reviewed and updated as appropriate: allergies, current medications, past family history, past medical history, past social history, past surgical history and problem list.   Objective:  There were no vitals filed for this visit. Self-Obtained  Fetal Status:     Movement: Present     Assessment and Plan:  Pregnancy: G4P3003  at [redacted]w[redacted]d 1. Supervision of high risk pregnancy, antepartum - Doing well - Reviewed follow-up US from early today, normal - MFM recommends growth in 5 week, scheduled 7/9 due to history of SGA with previous pregnancy  - Information given for [employer] to get maternity belt  - N/V improved on Diclegis  - Anticipatory guidance for next visit including plan for fasting labs, GTT, CBC, HIV, RPR and offer TDap   2. Short interval between pregnancies affecting pregnancy, antepartum - Last delivery 09/2018  3. Heart murmur  Preterm labor symptoms and general obstetric precautions including but not limited to vaginal bleeding, contractions, leaking of fluid and fetal movement were reviewed in detail with the patient.  Return in about 4 weeks (around 11/30/2019) for LOB, 28 week labs (fasting), In-Person.  Future Appointments  Date Time Provider Department Center  11/06/2019  9:30 AM Theressa Millard, PT [facility]  11/13/2019  8:30 AM Theressa Millard, PT [facility]  12/07/2019  8:00 AM [facility] US1 [facility]     Time spent on virtual visit: 10 minutes  Vonzella Nipple, PA-C

## 2019-11-03 ENCOUNTER — Ambulatory Visit: Payer: Medicaid Other

## 2019-11-05 DIAGNOSIS — Z23 Encounter for immunization: Secondary | ICD-10-CM | POA: Diagnosis not present

## 2019-11-05 DIAGNOSIS — O339 Maternal care for disproportion, unspecified: Secondary | ICD-10-CM | POA: Diagnosis not present

## 2019-11-06 ENCOUNTER — Telehealth: Payer: Self-pay | Admitting: Physical Therapy

## 2019-11-06 ENCOUNTER — Encounter: Payer: Medicaid Other | Attending: Nurse Practitioner | Admitting: Physical Therapy

## 2019-11-06 DIAGNOSIS — R2689 Other abnormalities of gait and mobility: Secondary | ICD-10-CM | POA: Insufficient documentation

## 2019-11-06 DIAGNOSIS — M6281 Muscle weakness (generalized): Secondary | ICD-10-CM | POA: Insufficient documentation

## 2019-11-06 DIAGNOSIS — M25551 Pain in right hip: Secondary | ICD-10-CM | POA: Insufficient documentation

## 2019-11-06 NOTE — Telephone Encounter (Signed)
Called patient about her appointment at 9:30 she no-showed. Left a message.  Eulis Foster, PT @6 /12/2019@ 9:59 AM

## 2019-11-13 ENCOUNTER — Other Ambulatory Visit: Payer: Self-pay

## 2019-11-13 ENCOUNTER — Encounter: Payer: Medicaid Other | Admitting: Physical Therapy

## 2019-11-13 ENCOUNTER — Encounter: Payer: Self-pay | Admitting: Physical Therapy

## 2019-11-13 DIAGNOSIS — M6281 Muscle weakness (generalized): Secondary | ICD-10-CM | POA: Diagnosis not present

## 2019-11-13 DIAGNOSIS — M25551 Pain in right hip: Secondary | ICD-10-CM

## 2019-11-13 DIAGNOSIS — R2689 Other abnormalities of gait and mobility: Secondary | ICD-10-CM | POA: Diagnosis not present

## 2019-11-13 NOTE — Patient Instructions (Addendum)
DEVELOPING AN EXERCISE PROGRAM DURING PREGNANCY  Discontinue Exercise If You Develop Any Of The Following: Vaginal bleeding Regular Contractions Amnionic fluid leakage Shortness of breath BEFORE exertion Headache Dizziness Chest pain Muscle weakness affecting balance Calf pain or swelling   Intensity: You should be able to carry on a conversation, but need to take more frequent breaths If you are too out of breath to talk, the level is too intense  Duration: 20-45 minutes each session If you cannot make at least 20 minutes, bring the intensity down You can break it up into 2 sessions per day if experiencing a lot of nausea or shortness of breath that occurs more in the first trimester. Include warm up by building the intensity for 5 minutes AND cool down by slowing intensity for 5 minutes  Exercise Type:  Pick something you enjoy and is convenient to your lifestyle. Example: Walking, running, swimming, biking, exercise machines like the elliptical or rowing machines DO NOT: water ski, scuba diving, sauna/hot tub CHECK WITH DOCTOR: horse back riding, downhill or cross country skiing, ice skating, gymnastics  Frequency: 3-6 Days per week Be active EVERY day (at least an easy walk or gentle yoga class for example)   Access Code: [email or web address] URL: [email or web address] Date: 11/13/2019 Prepared by: Eulis Foster  Exercises Quadruped Cat Camel - 2 x daily - 7 x weekly - 1 sets - 10 reps Child's Pose Stretch - 1 x daily - 7 x weekly - 1 sets - 1 reps - 30 sec hold Seated Piriformis Stretch - 1 x daily - 7 x weekly - 1 sets - 2 reps - 30 sec hold Child's Pose with Sidebending - 1 x daily - 7 x weekly - 1 sets - 2 reps - 30 sec hold Quadruped Alternating Arm Lift - 1 x daily - 7 x weekly - 1 sets - 15 reps Beginner Front Arm Support - 1 x daily - 7 x weekly - 1 sets - 10 reps Clam - 1 x daily - 7 x weekly - 1 sets - 15 reps Mini Squat with Chair - 1 x daily - 7 x  weekly - 1 sets - 10 reps  Patient Education Posture and Body Mechanics Eulis Foster, PT [facility] [address], [address] [place], [place] [address] W: [phone] [email or web address]

## 2019-11-13 NOTE — Therapy (Addendum)
[place] at [facility] [address], [place], [place], [address] Phone: [phone]   Fax:  [phone]  Physical Therapy Treatment  Patient Details  Name: Shirley Baker MRN: [id number] Date of Birth: 07/03/1990 Referring Provider (PT): Earlie Server, NP   Encounter Date: 11/13/2019   PT End of Session - 11/13/19 0902    Visit Number 2    Date for PT Re-Evaluation 01/08/20    Authorization Type Medicaid    Authorization Time Period 10/30/2019-11/19/2019    Authorization - Visit Number 1    Authorization - Number of Visits 3    PT Start Time 0819    PT Stop Time 0903    PT Time Calculation (min) 44 min    Activity Tolerance Patient tolerated treatment well;No increased pain    Behavior During Therapy WFL for tasks assessed/performed           Past Medical History:  Diagnosis Date  . Anemia   . Heart murmur   . Ovarian cyst   . Syncope     Past Surgical History:  Procedure Laterality Date  . NO PAST SURGERIES      There were no vitals filed for this visit.   Subjective Assessment - 11/13/19 0820    Subjective Patient is [redacted] weeks along in her pregnancy. I have hip pain. The bone and joint hurt. Pain comes on suddenly.    Patient Stated Goals relieve hip pain    Currently in Pain? Yes    Pain Score 5     Pain Location Hip    Pain Orientation Right;Left    Pain Descriptors / Indicators Sharp    Pain Type Acute pain    Pain Radiating Towards shoots up sides into the back    Pain Onset More than a month ago    Pain Frequency Intermittent    Aggravating Factors  walking, going up and down curb    Pain Relieving Factors sit still    Multiple Pain Sites No              [facility] PT Assessment - 11/13/19 0001      Assessment   Medical Diagnosis M25.559 Hip pain    Referring Provider (PT) Earlie Server, NP    Onset Date/Surgical Date 08/29/19    Prior Therapy none      Precautions   Precautions Other  (comment)    Precaution Comments pregnant      Restrictions   Weight Bearing Restrictions No      Home Environment   Living Environment Private residence      Prior Function   Level of Independence Independent      Cognition   Overall Cognitive Status Within Functional Limits for tasks assessed      Posture/Postural Control   Posture/Postural Control Postural limitations    Posture Comments Pregnant posture      ROM / Strength   AROM / PROM / Strength AROM;PROM;Strength      AROM   Lumbar - Left Side Bend decreased by 25% with pain on the right      Strength   Right Hip ABduction 3/5    Right Hip ADduction 3/5    Left Hip ABduction 4/5    Left Hip ADduction 4/5      Palpation   SI assessment  right ilium is rotated anteriorly    Palpation comment tenderness located in right quadratus      Special Tests  Special Tests Sacrolliac Tests    Sacroiliac Tests  Pelvic Compression      Pelvic Compression   Findings Positive    Side Right    comment pain      Trendelenburg Test   Findings Positive    Side Left    Comments right hip drops                      Pelvic Floor Special Questions - 11/13/19 0001    Number of Pregnancies 3    Number of Vaginal Deliveries 3    Any difficulty with labor and deliveries No    Urinary Leakage No             [facility] Adult PT Treatment/Exercise - 11/13/19 0001      Lumbar Exercises: Stretches   Quadruped Mid Back Stretch 3 reps;30 seconds    Quadruped Mid Back Stretch Limitations childs pose all three directions    Piriformis Stretch Right;Left;1 rep;30 seconds    Piriformis Stretch Limitations supine pushing knee down      Lumbar Exercises: Sidelying   Clam Right;Left;15 reps;1 second    Clam Limitations pillow between feet, tactile cues to not rock her pelvis      Lumbar Exercises: Quadruped   Madcat/Old Horse 10 reps    Single Arm Raise Right;Left;10 reps;1 second    Straight Leg Raise 10 reps;1  second    Straight Leg Raises Limitations righ tleft in limited range to keep control    Other Quadruped Lumbar Exercises quadruped rock 10x with hips Ir then 10x with hips ER      Manual Therapy   Manual Therapy Joint mobilization;Soft tissue mobilization;Myofascial release;Muscle Energy Technique    Joint Mobilization gapping of the right L1-L5 facets in left sidely    Muscle Energy Technique correct right ilium in sidely                  PT Education - 11/13/19 0859    Education Details Access Code: [email or web address]; lifting technique with squat    Person(s) Educated Patient    Methods Explanation;Demonstration;Verbal cues;Handout    Comprehension Returned demonstration;Verbalized understanding            PT Short Term Goals - 11/13/19 0903      PT SHORT TERM GOAL #1   Title independent with initial HEP    Period Weeks    Status Achieved    Target Date 11/13/19             PT Long Term Goals - 11/13/19 0903      PT LONG TERM GOAL #1   Title independent with HEP    Baseline continously updated as she progresses    Time 12    Period Weeks    Status On-going      PT LONG TERM GOAL #2   Title able to walk with full weightbear on the right due to pelvis in correct alignment    Baseline right ilium is anteriorly rotated and is corrected with muscle energy at treatment    Time 12    Period Weeks    Status On-going      PT LONG TERM GOAL #3   Title able to go up and down stairs and walk with pain decreased </= 2/10    Baseline pain level is 5/10    Time 12    Period Weeks    Status On-going      PT LONG  TERM GOAL #4   Title understand correct body mechanics and ways to lay down to put less strain on her back and pelvis    Baseline continously learning    Period Weeks    Status On-going                 Plan - 11/13/19 0836    Clinical Impression Statement Patient missed her last two appoinments due to sleeping late. Patient attended todays session  with intermittent bilateral hip pain at level 5/10. Her right ilium was rotated anteriorly. After manual work the pelvis was in correct alignment. Patient continues to have weakness in her hips and has learned how to strengthen her hips and back today. She has learned how to squat to pick up her children instead of flexing her lumbar. Patient will continue to learn more body mechanics to reduce strain on hips and back. She ill benefit from skilled therapy to perform manual work to her lumbar and pelvis, strengthening to the hips and back.    Personal Factors and Comorbidities Comorbidity 2    Comorbidities Sickle cell trait; last child born on 10/07/2018; Patient is due 02/28/2020    Examination-Activity Limitations Transfers;Stand;Stairs;Bed Mobility;Locomotion Level    Examination-Participation Restrictions Community Activity;Cleaning    Stability/Clinical Decision Making Evolving/Moderate complexity    Rehab Potential Excellent    PT Duration 12 weeks    PT Treatment/Interventions Cryotherapy;Moist Heat;Gait training;Therapeutic activities;Therapeutic exercise;Neuromuscular re-education;Patient/family education;Manual techniques;Taping;Spinal Manipulations    PT Next Visit Plan correct pelvis, continue to work on body mechanics with care of children and lifting, progress hip strength in standing    PT Home Exercise Plan Access Code: [email or web address]    Recommended Other Services MD signed initial note    Consulted and Agree with Plan of Care Patient           Patient will benefit from skilled therapeutic intervention in order to improve the following deficits and impairments:  Pain, Decreased activity tolerance, Difficulty walking, Increased muscle spasms, Decreased strength  Visit Diagnosis: Pain in right hip  Muscle weakness (generalized)  Other abnormalities of gait and mobility     Problem List Patient Active Problem List   Diagnosis Date Noted  . Supervision of high risk pregnancy,  antepartum 08/16/2019  . Short interval between pregnancies affecting pregnancy, antepartum 08/16/2019  . Heart murmur   . Sickle cell trait ([place]) 04/14/2018  . History of low birth weight 04/14/2018  . Nausea and vomiting in pregnancy 04/14/2018    Earlie Counts, PT 11/13/19 9:10 AM   [place] at [facility] [address] [place], [place], [address] Phone: [phone]   Fax:  [phone]  Name: Shirley Baker MRN: [id number] Date of Birth: [birth year]  PHYSICAL THERAPY DISCHARGE SUMMARY  Visits from Start of Care: 2  Current functional level related to goals / functional outcomes: Patient called on 11/19/2019 to be discharged. She reports her pain is better and the HEP is helping her.    Remaining deficits: See above.    Education / Equipment: HEP Plan: Patient agrees to discharge.  Patient goals were not met. Patient is being discharged due to being pleased with the current functional level.  Thank you for the referral. Earlie Counts, PT 11/20/19 9:07 AM  ?????

## 2019-11-24 ENCOUNTER — Other Ambulatory Visit: Payer: Self-pay

## 2019-11-24 ENCOUNTER — Encounter (HOSPITAL_COMMUNITY): Payer: Self-pay | Admitting: Obstetrics and Gynecology

## 2019-11-24 ENCOUNTER — Inpatient Hospital Stay (HOSPITAL_COMMUNITY)
Admission: AD | Admit: 2019-11-24 | Discharge: 2019-11-25 | Disposition: A | Discharge status: Home / Self Care | Payer: Medicaid Other | Attending: Obstetrics and Gynecology | Admitting: Obstetrics and Gynecology

## 2019-11-24 DIAGNOSIS — G43009 Migraine without aura, not intractable, without status migrainosus: Secondary | ICD-10-CM

## 2019-11-24 DIAGNOSIS — K0889 Other specified disorders of teeth and supporting structures: Secondary | ICD-10-CM | POA: Diagnosis not present

## 2019-11-24 DIAGNOSIS — O09899 Supervision of other high risk pregnancies, unspecified trimester: Secondary | ICD-10-CM

## 2019-11-24 DIAGNOSIS — Z79899 Other long term (current) drug therapy: Secondary | ICD-10-CM | POA: Insufficient documentation

## 2019-11-24 DIAGNOSIS — O99352 Diseases of the nervous system complicating pregnancy, second trimester: Secondary | ICD-10-CM | POA: Diagnosis not present

## 2019-11-24 DIAGNOSIS — O99612 Diseases of the digestive system complicating pregnancy, second trimester: Secondary | ICD-10-CM | POA: Diagnosis not present

## 2019-11-24 DIAGNOSIS — Z3A26 26 weeks gestation of pregnancy: Secondary | ICD-10-CM | POA: Diagnosis not present

## 2019-11-24 DIAGNOSIS — G43909 Migraine, unspecified, not intractable, without status migrainosus: Secondary | ICD-10-CM | POA: Diagnosis not present

## 2019-11-24 DIAGNOSIS — O0990 Supervision of high risk pregnancy, unspecified, unspecified trimester: Secondary | ICD-10-CM

## 2019-11-24 DIAGNOSIS — R011 Cardiac murmur, unspecified: Secondary | ICD-10-CM

## 2019-11-24 MED ORDER — SODIUM CHLORIDE 0.9 % IV SOLN: INTRAVENOUS | Status: DC

## 2019-11-24 MED ORDER — AMOXICILLIN-POT CLAVULANATE 875-125 MG PO TABS: 1.0000 | ORAL_TABLET | Freq: Two times a day (BID) | ORAL | 0 refills | Status: DC

## 2019-11-24 MED ORDER — DIPHENHYDRAMINE HCL 50 MG/ML IJ SOLN
25.0000 mg | Freq: Once | INTRAMUSCULAR | Status: AC
  Administered 2019-11-24: 25 mg via INTRAVENOUS
  Filled 2019-11-24: qty 1

## 2019-11-24 MED ORDER — PROCHLORPERAZINE EDISYLATE 10 MG/2ML IJ SOLN
10.0000 mg | Freq: Once | INTRAMUSCULAR | Status: AC
  Administered 2019-11-24: 10 mg via INTRAVENOUS
  Filled 2019-11-24: qty 2

## 2019-11-24 MED ORDER — DEXAMETHASONE SODIUM PHOSPHATE 10 MG/ML IJ SOLN
10.0000 mg | Freq: Once | INTRAMUSCULAR | Status: AC
  Administered 2019-11-24: 10 mg via INTRAVENOUS
  Filled 2019-11-24: qty 1

## 2019-11-24 NOTE — Discharge Instructions (Signed)
Dental Pain Dental pain may be caused by many things, including:  Tooth decay (cavities or caries). Cavities expose the nerve of your tooth to air and to hot or cold temperatures. This can cause pain or discomfort.  Abscess or infection. A dental abscess is a collection of pus from a bacterial infection in the inner part of the tooth (pulp). It usually occurs at the end of the root of a tooth.  Injury.  An unknown reason (idiopathic). Your pain may be mild or severe. It may occur when you are:  Chewing.  Exposed to hot or cold temperatures.  Eating or drinking sugary foods or beverages, such as soda or candy. Your pain may be constant, or it may come and go without cause. Follow these instructions at home:  Watch your dental pain for any changes. The following actions may help to lessen any discomfort that you are feeling: Medicines  Take over-the-counter and prescription medicines only as told by your health care provider.  If you were prescribed an antibiotic medicine, take it as told by your health care provider. Do not stop taking the antibiotic even if you start to feel better. Eating and drinking  Avoid foods or drinks that cause you pain, such as: ? Very hot or very cold foods or drinks. ? Sweet or sugary foods or drinks. Managing pain and swelling  Apply ice to the painful area of your face: ? Put ice in a plastic bag. ? Place a towel between your skin and the bag. ? Leave the ice on for 20 minutes, 2-3 times a day. Brushing your teeth  To keep your mouth and gums healthy, use fluoride toothpaste to brush your teeth twice a day. Floss once a day.  Use a toothpaste made for sensitive teeth if directed by your health care provider.  Brush your teeth with a soft-bristled toothbrush. General instructions  Do not apply heat to the outside of your face.  Gargle with a salt-water mixture 3-4 times a day or as needed. To make a salt-water mixture, completely dissolve  -1 tsp of salt in 1 cup of warm water.  Keep all follow-up visits as told by your health care provider. This is important.  Apply ice to the outside of your jaw if there is swelling. Do not put ice directly on the skin. Contact a health care provider if:  Your pain is not controlled with medicines.  Your symptoms get worse.  You have new symptoms. Get help right away if you:  Are unable to open your mouth.  Are having trouble breathing or swallowing.  Have a fever.  Notice that your face, neck, or jaw is swollen. Summary  Dental pain may be caused by many things, including tooth decay and infection.  Your pain may be mild or severe.  Take over-the-counter and prescription medicines only as told by your health care provider.  Watch your dental pain for any changes. Let your health care provider know if symptoms get worse. This information is not intended to replace advice given to you by your health care provider. Make sure you discuss any questions you have with your health care provider. Document Revised: 09/12/2018 Document Reviewed: 04/07/2017 Elsevier Patient Education  2020 Elsevier Inc.  

## 2019-11-24 NOTE — MAU Provider Note (Signed)
History     CSN: [id number]  Arrival date and time: 11/24/19 2140   First Provider Initiated Contact with Patient 11/24/19 2211      Chief Complaint  Patient presents with   Migraine   Shirley Baker is a 29 y.o. G4P3003 at [redacted]w[redacted]d who receives care at [facility].  She presents today for Migraine.  Patient reports she started having a toothache 3 days that was not relieved with tylenol.  She states the pain from the tooth triggered a migraine, which has been ongoing since that time.  She rates the migraine a 10/10 and describes it as "painful and shoots through my eyes and my head is pounding like crazy and the same for my tooth."  She goes on to state taht she is having a "sharp pain that shoots through her tooth all the way up to her head."  She denies abdominal contractions, but reports some cramping that she "tries not to worry about because I got it with my last pregnancy."  She denies vaginal bleeding, discharge, or leaking of fluid.  Patient endorses fetal movement.      OB History    Gravida  4   Para  3   Term  3   Preterm      AB      Living  3     SAB      TAB      Ectopic      Multiple  0   Live Births  3        Obstetric Comments  LBW        Past Medical History:  Diagnosis Date   Anemia    Heart murmur    Ovarian cyst    Syncope     Past Surgical History:  Procedure Laterality Date   NO PAST SURGERIES      Family History  Problem Relation Age of Onset   Healthy Mother    Healthy Father    Hearing loss Neg Hx     Social History   Tobacco Use   Smoking status: Never Smoker   Smokeless tobacco: Never Used   Tobacco comment: hookah, last Aug 2019- patient denies 08/16/2019  Vaping Use   Vaping Use: Never used  Substance Use Topics   Alcohol use: No   Drug use: No    Allergies: No Known Allergies  Medications Prior to Admission  Medication Sig Dispense Refill Last Dose   acetaminophen (TYLENOL) 500 MG tablet Take  500 mg by mouth every 6 (six) hours as needed.      Doxylamine-Pyridoxine (DICLEGIS) 10-10 MG TBEC Take 2 tablets at bedtime and one in the morning and one in the afternoon as needed for nausea. 60 tablet 2    Elastic Bandages & Supports (COMFORT FIT MATERNITY SUPP MED) MISC 1 Device by Does not apply route daily. (Patient not taking: Reported on 11/02/2019) 1 each 0    Prenat w/o A Vit-FeFum-FePo-FA (CONCEPT OB) 130-92.4-1 MG CAPS TAKE ONE CAPSULE BY MOUTH DAILY 30 capsule 11     Review of Systems  Constitutional: Positive for chills. Negative for fever.  HENT: Positive for congestion and mouth sores (Left side d/t toothache). Negative for facial swelling, sinus pressure and sore throat.   Eyes: Positive for photophobia and pain. Negative for visual disturbance.  Respiratory: Negative for cough and shortness of breath.   Gastrointestinal: Negative for abdominal pain, nausea and vomiting.  Genitourinary: Negative for vaginal bleeding and vaginal discharge.  Neurological:  Positive for dizziness, light-headedness and headaches.   Physical Exam   Blood pressure 120/82, pulse (!) 106, temperature 99.2 F (37.3 C), temperature source Oral, resp. rate 18, weight 64.5 kg, last menstrual period 05/24/2019, SpO2 100 %, not currently breastfeeding.  Physical Exam Constitutional:      Appearance: Normal appearance.  HENT:     Head: Normocephalic and atraumatic.     Mouth/Throat:     Dentition: Dental tenderness present.      Comments: Left side tender to touch.  No apparent swelling or edema.  Eyes:     Conjunctiva/sclera: Conjunctivae normal.  Cardiovascular:     Rate and Rhythm: Normal rate and regular rhythm.     Heart sounds: Normal heart sounds.  Pulmonary:     Breath sounds: Normal breath sounds.  Abdominal:     General: Bowel sounds are normal.     Palpations: Abdomen is soft.     Tenderness: There is no abdominal tenderness.  Musculoskeletal:        General: Normal range of  motion.     Cervical back: Normal range of motion.  Skin:    General: Skin is warm and dry.  Neurological:     Mental Status: She is alert and oriented to person, place, and time.  Psychiatric:        Mood and Affect: Mood normal.        Thought Content: Thought content normal.        Judgment: Judgment normal.     Fetal Assessment 150 bpm, Mod Var, -Decels, +Accels Toco: No ctx graphed  MAU Course  No results found for this or any previous visit (from the past 24 hour(s)). No results found.  MDM PE EFM Pain medication Assessment and Plan  29 year old G21P3003  SIUP at 26.3weeks Cat I FT Migraine Toothache  -POC reviewed in triage.  -Informed that will place in room and give IV medication for migraine. -Discussed need to follow up with dentist regarding tooth ache. -However provider will give script for antibiotics and note stating okay to have dental procedures. -Patient agreeable.  -Exam performed and findings discussed. -Will give compazine, benadryl, and Decadron IV with NS infusion. -Will monitor and reassess.    Maryann Conners MSN, CNM 11/24/2019, 10:11 PM   Reassessment (11:21 PM)  -Patient reports improvement in migraine. -NST Reactive -Encouraged to follow up in dentist ASAP. -Will send home with script for Augmentin BID  -Will complete LR infusion and discharge to home. -Encouraged to call or return to MAU if symptoms worsen or with the onset of new symptoms. -Discharged to home in improved condition.  Maryann Conners MSN, CNM Advanced Practice Provider, [facility] for [facility]

## 2019-11-24 NOTE — MAU Note (Signed)
Patient reports to MAU for severe tooth ache and severe migraine.  Pt reports having chills. Denies VB/LOF reports movement.

## 2019-11-30 ENCOUNTER — Other Ambulatory Visit: Payer: Medicaid Other

## 2019-11-30 ENCOUNTER — Encounter: Payer: Self-pay | Admitting: Medical

## 2019-11-30 ENCOUNTER — Other Ambulatory Visit: Payer: Self-pay

## 2019-11-30 ENCOUNTER — Ambulatory Visit (INDEPENDENT_AMBULATORY_CARE_PROVIDER_SITE_OTHER): Payer: Medicaid Other | Admitting: Medical

## 2019-11-30 VITALS — BP 106/69 | HR 77 | Wt 140.9 lb

## 2019-11-30 DIAGNOSIS — O0990 Supervision of high risk pregnancy, unspecified, unspecified trimester: Secondary | ICD-10-CM

## 2019-11-30 DIAGNOSIS — O09892 Supervision of other high risk pregnancies, second trimester: Secondary | ICD-10-CM

## 2019-11-30 DIAGNOSIS — O09899 Supervision of other high risk pregnancies, unspecified trimester: Secondary | ICD-10-CM

## 2019-11-30 DIAGNOSIS — O26892 Other specified pregnancy related conditions, second trimester: Secondary | ICD-10-CM

## 2019-11-30 DIAGNOSIS — Z87898 Personal history of other specified conditions: Secondary | ICD-10-CM

## 2019-11-30 DIAGNOSIS — Z23 Encounter for immunization: Secondary | ICD-10-CM | POA: Diagnosis not present

## 2019-11-30 DIAGNOSIS — Z3A27 27 weeks gestation of pregnancy: Secondary | ICD-10-CM

## 2019-11-30 DIAGNOSIS — O0992 Supervision of high risk pregnancy, unspecified, second trimester: Secondary | ICD-10-CM

## 2019-11-30 DIAGNOSIS — O219 Vomiting of pregnancy, unspecified: Secondary | ICD-10-CM

## 2019-11-30 DIAGNOSIS — R109 Unspecified abdominal pain: Secondary | ICD-10-CM

## 2019-11-30 DIAGNOSIS — Z8759 Personal history of other complications of pregnancy, childbirth and the puerperium: Secondary | ICD-10-CM

## 2019-11-30 DIAGNOSIS — D573 Sickle-cell trait: Secondary | ICD-10-CM

## 2019-11-30 NOTE — Progress Notes (Signed)
   PRENATAL VISIT NOTE  Subjective:  Shirley Baker is a 29 y.o. G4P3003 at [redacted]w[redacted]d being seen today for ongoing prenatal care.  She is currently monitored for the following issues for this high-risk pregnancy and has Sickle cell trait (HCC); History of low birth weight; Nausea and vomiting in pregnancy; Supervision of high risk pregnancy, antepartum; Short interval between pregnancies affecting pregnancy, antepartum; and Heart murmur on their problem list.  Patient reports pain on the right side of abdomen proximal to umbilicus.  Contractions: Not present. Vag. Bleeding: None.  Movement: Present. Denies leaking of fluid.   The following portions of the patient's history were reviewed and updated as appropriate: allergies, current medications, past family history, past medical history, past social history, past surgical history and problem list.   Objective:   Vitals:   11/30/19 0903  BP: 106/69  Pulse: 77  Weight: 140 lb 14.4 oz (63.9 kg)    Fetal Status: Fetal Heart Rate (bpm): 136 Fundal Height: 27 cm Movement: Present     General:  Alert, oriented and cooperative. Patient is in no acute distress.  Skin: Skin is warm and dry. No rash noted.   Cardiovascular: Normal heart rate noted  Respiratory: Normal respiratory effort, no problems with respiration noted  Abdomen: Soft, gravid, appropriate for gestational age.  Pain/Pressure: Present     Pelvic: Cervical exam deferred        Extremities: Normal range of motion.  Edema: Trace  Mental Status: Normal mood and affect. Normal behavior. Normal judgment and thought content.   Assessment and Plan:  Pregnancy: G4P3003 at [redacted]w[redacted]d 1. Supervision of high risk pregnancy, antepartum - 2 hour GTT, CBC, HIV, RPR today  - Tdap vaccine greater than or equal to 7yo IM  2. Short interval between pregnancies affecting pregnancy, antepartum  3. Sickle cell trait (HCC)  4. History of low birth weight - Growth [place] scheduled 12/07/19  5. Nausea and  vomiting in pregnancy - improved on Diclegis, will continue   6. Abdominal pain in pregnancy, second trimester  - Advised of belly band that may be helpful - Tylenol PRN for pain  - Declined PT referral at this time   Preterm labor symptoms and general obstetric precautions including but not limited to vaginal bleeding, contractions, leaking of fluid and fetal movement were reviewed in detail with the patient. Please refer to After Visit Summary for other counseling recommendations.   Return in about 2 weeks (around 12/14/2019) for LOB, In-Person, any provider.  Future Appointments  Date Time Provider Department Center  11/30/2019  9:30 AM [facility]  12/07/2019  8:00 AM [facility]    Vonzella Nipple, PA-C

## 2019-11-30 NOTE — Patient Instructions (Signed)
Fetal Movement Counts Patient Name: ________________________________________________ Patient Due Date: ____________________ What is a fetal movement count?  A fetal movement count is the number of times that you feel your baby move during a certain amount of time. This may also be called a fetal kick count. A fetal movement count is recommended for every pregnant woman. You may be asked to start counting fetal movements as early as week 28 of your pregnancy. Pay attention to when your baby is most active. You may notice your baby's sleep and wake cycles. You may also notice things that make your baby move more. You should do a fetal movement count:  When your baby is normally most active.  At the same time each day. A good time to count movements is while you are resting, after having something to eat and drink. How do I count fetal movements? 1. Find a quiet, comfortable area. Sit, or lie down on your side. 2. Write down the date, the start time and stop time, and the number of movements that you felt between those two times. Take this information with you to your health care visits. 3. Write down your start time when you feel the first movement. 4. Count kicks, flutters, swishes, rolls, and jabs. You should feel at least 10 movements. 5. You may stop counting after you have felt 10 movements, or if you have been counting for 2 hours. Write down the stop time. 6. If you do not feel 10 movements in 2 hours, contact your health care provider for further instructions. Your health care provider may want to do additional tests to assess your baby's well-being. Contact a health care provider if:  You feel fewer than 10 movements in 2 hours.  Your baby is not moving like he or she usually does. Date: ____________ Start time: ____________ Stop time: ____________ Movements: ____________ Date: ____________ Start time: ____________ Stop time: ____________ Movements: ____________ Date: ____________  Start time: ____________ Stop time: ____________ Movements: ____________ Date: ____________ Start time: ____________ Stop time: ____________ Movements: ____________ Date: ____________ Start time: ____________ Stop time: ____________ Movements: ____________ Date: ____________ Start time: ____________ Stop time: ____________ Movements: ____________ Date: ____________ Start time: ____________ Stop time: ____________ Movements: ____________ Date: ____________ Start time: ____________ Stop time: ____________ Movements: ____________ Date: ____________ Start time: ____________ Stop time: ____________ Movements: ____________ This information is not intended to replace advice given to you by your health care provider. Make sure you discuss any questions you have with your health care provider. Document Revised: 01/04/2019 Document Reviewed: 01/04/2019 Elsevier Patient Education  2020 [employer] Braxton Hicks Contractions Contractions of the uterus can occur throughout pregnancy, but they are not always a sign that you are in labor. You may have practice contractions called Braxton Hicks contractions. These false labor contractions are sometimes confused with true labor. What are Braxton Hicks contractions? Braxton Hicks contractions are tightening movements that occur in the muscles of the uterus before labor. Unlike true labor contractions, these contractions do not result in opening (dilation) and thinning of the cervix. Toward the end of pregnancy (32-34 weeks), Braxton Hicks contractions can happen more often and may become stronger. These contractions are sometimes difficult to tell apart from true labor because they can be very uncomfortable. You should not feel embarrassed if you go to the hospital with false labor. Sometimes, the only way to tell if you are in true labor is for your health care provider to look for changes in the cervix. The health care provider   will do a physical exam and may  monitor your contractions. If you are not in true labor, the exam should show that your cervix is not dilating and your water has not broken. If there are no other health problems associated with your pregnancy, it is completely safe for you to be sent home with false labor. You may continue to have Braxton Hicks contractions until you go into true labor. How to tell the difference between true labor and false labor True labor  Contractions last 30-70 seconds.  Contractions become very regular.  Discomfort is usually felt in the top of the uterus, and it spreads to the lower abdomen and low back.  Contractions do not go away with walking.  Contractions usually become more intense and increase in frequency.  The cervix dilates and gets thinner. False labor  Contractions are usually shorter and not as strong as true labor contractions.  Contractions are usually irregular.  Contractions are often felt in the front of the lower abdomen and in the groin.  Contractions may go away when you walk around or change positions while lying down.  Contractions get weaker and are shorter-lasting as time goes on.  The cervix usually does not dilate or become thin. Follow these instructions at home:   Take over-the-counter and prescription medicines only as told by your health care provider.  Keep up with your usual exercises and follow other instructions from your health care provider.  Eat and drink lightly if you think you are going into labor.  If Braxton Hicks contractions are making you uncomfortable: ? Change your position from lying down or resting to walking, or change from walking to resting. ? Sit and rest in a tub of warm water. ? Drink enough fluid to keep your urine pale yellow. Dehydration may cause these contractions. ? Do slow and deep breathing several times an hour.  Keep all follow-up prenatal visits as told by your health care provider. This is important. Contact a  health care provider if:  You have a fever.  You have continuous pain in your abdomen. Get help right away if:  Your contractions become stronger, more regular, and closer together.  You have fluid leaking or gushing from your vagina.  You pass blood-tinged mucus (bloody show).  You have bleeding from your vagina.  You have low back pain that you never had before.  You feel your baby's head pushing down and causing pelvic pressure.  Your baby is not moving inside you as much as it used to. Summary  Contractions that occur before labor are called Braxton Hicks contractions, false labor, or practice contractions.  Braxton Hicks contractions are usually shorter, weaker, farther apart, and less regular than true labor contractions. True labor contractions usually become progressively stronger and regular, and they become more frequent.  Manage discomfort from Braxton Hicks contractions by changing position, resting in a warm bath, drinking plenty of water, or practicing deep breathing. This information is not intended to replace advice given to you by your health care provider. Make sure you discuss any questions you have with your health care provider. Document Revised: 04/29/2017 Document Reviewed: 09/30/2016 Elsevier Patient Education  2020 [employer] Inc[employer]  

## 2019-12-01 LAB — CBC
Hematocrit: 33 % — ABNORMAL LOW (ref 34.0–46.6)
Hemoglobin: 11.1 g/dL (ref 11.1–15.9)
MCH: 30.5 pg (ref 26.6–33.0)
MCHC: 33.6 g/dL (ref 31.5–35.7)
MCV: 91 fL (ref 79–97)
Platelets: 274 10*3/uL (ref 150–450)
RBC: 3.64 x10E6/uL — ABNORMAL LOW (ref 3.77–5.28)
RDW: 13.1 % (ref 11.7–15.4)
WBC: 6.3 10*3/uL (ref 3.4–10.8)

## 2019-12-01 LAB — RPR: RPR Ser Ql: NONREACTIVE

## 2019-12-01 LAB — GLUCOSE TOLERANCE, 2 HOURS W/ 1HR
Glucose, 1 hour: 118 mg/dL (ref 65–179)
Glucose, 2 hour: 93 mg/dL (ref 65–152)
Glucose, Fasting: 71 mg/dL (ref 65–91)

## 2019-12-01 LAB — HIV ANTIBODY (ROUTINE TESTING W REFLEX): HIV Screen 4th Generation wRfx: NONREACTIVE

## 2019-12-07 ENCOUNTER — Ambulatory Visit: Payer: Medicaid Other | Attending: Obstetrics and Gynecology

## 2019-12-18 ENCOUNTER — Encounter: Payer: Medicaid Other | Admitting: Medical

## 2019-12-20 ENCOUNTER — Ambulatory Visit: Payer: Medicaid Other | Attending: Obstetrics and Gynecology

## 2019-12-20 ENCOUNTER — Ambulatory Visit (INDEPENDENT_AMBULATORY_CARE_PROVIDER_SITE_OTHER): Payer: Medicaid Other | Admitting: Medical

## 2019-12-20 ENCOUNTER — Encounter: Payer: Self-pay | Admitting: Medical

## 2019-12-20 ENCOUNTER — Other Ambulatory Visit: Payer: Self-pay

## 2019-12-20 VITALS — BP 114/76 | HR 72 | Wt 143.0 lb

## 2019-12-20 DIAGNOSIS — D573 Sickle-cell trait: Secondary | ICD-10-CM

## 2019-12-20 DIAGNOSIS — O0990 Supervision of high risk pregnancy, unspecified, unspecified trimester: Secondary | ICD-10-CM

## 2019-12-20 DIAGNOSIS — O26893 Other specified pregnancy related conditions, third trimester: Secondary | ICD-10-CM

## 2019-12-20 DIAGNOSIS — Z87898 Personal history of other specified conditions: Secondary | ICD-10-CM

## 2019-12-20 DIAGNOSIS — Z3A30 30 weeks gestation of pregnancy: Secondary | ICD-10-CM | POA: Diagnosis not present

## 2019-12-20 DIAGNOSIS — O09293 Supervision of pregnancy with other poor reproductive or obstetric history, third trimester: Secondary | ICD-10-CM

## 2019-12-20 DIAGNOSIS — O09893 Supervision of other high risk pregnancies, third trimester: Secondary | ICD-10-CM

## 2019-12-20 DIAGNOSIS — Z862 Personal history of diseases of the blood and blood-forming organs and certain disorders involving the immune mechanism: Secondary | ICD-10-CM | POA: Diagnosis not present

## 2019-12-20 DIAGNOSIS — R109 Unspecified abdominal pain: Secondary | ICD-10-CM

## 2019-12-20 DIAGNOSIS — O09899 Supervision of other high risk pregnancies, unspecified trimester: Secondary | ICD-10-CM

## 2019-12-20 DIAGNOSIS — Z362 Encounter for other antenatal screening follow-up: Secondary | ICD-10-CM

## 2019-12-20 DIAGNOSIS — Z8759 Personal history of other complications of pregnancy, childbirth and the puerperium: Secondary | ICD-10-CM

## 2019-12-20 DIAGNOSIS — O0993 Supervision of high risk pregnancy, unspecified, third trimester: Secondary | ICD-10-CM

## 2019-12-20 MED ORDER — CYCLOBENZAPRINE HCL 10 MG PO TABS: 10.0000 mg | ORAL_TABLET | Freq: Two times a day (BID) | ORAL | 1 refills | Status: DC | PRN

## 2019-12-20 NOTE — Progress Notes (Signed)
   PRENATAL VISIT NOTE  Subjective:  Shirley Baker is a 29 y.o. G4P3003 at [redacted]w[redacted]d being seen today for ongoing prenatal care.  She is currently monitored for the following issues for this high-risk pregnancy and has Sickle cell trait (HCC); History of low birth weight; Nausea and vomiting in pregnancy; Supervision of high risk pregnancy, antepartum; Short interval between pregnancies affecting pregnancy, antepartum; and Heart murmur on their problem list.  Patient reports abdominal pain.  Contractions: Not present. Vag. Bleeding: None.  Movement: Present. Denies leaking of fluid.   The following portions of the patient's history were reviewed and updated as appropriate: allergies, current medications, past family history, past medical history, past social history, past surgical history and problem list.   Objective:   Vitals:   12/20/19 1013  BP: 114/76  Pulse: 72  Weight: 143 lb (64.9 kg)    Fetal Status: Fetal Heart Rate (bpm): 136 Fundal Height: 29 cm Movement: Present     General:  Alert, oriented and cooperative. Patient is in no acute distress.  Skin: Skin is warm and dry. No rash noted.   Cardiovascular: Normal heart rate noted  Respiratory: Normal respiratory effort, no problems with respiration noted  Abdomen: Soft, gravid, appropriate for gestational age.  Pain/Pressure: Present     Pelvic: Cervical exam deferred        Extremities: Normal range of motion.  Edema: None  Mental Status: Normal mood and affect. Normal behavior. Normal judgment and thought content.   Assessment and Plan:  Pregnancy: G4P3003 at [redacted]w[redacted]d 1. Supervision of high risk pregnancy, antepartum - Normal GTT and iron at last visit discussed   2. Short interval between pregnancies affecting pregnancy, antepartum  3. History of low birth weight  4. Sickle cell trait (HCC)  5. 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  Dispense: 30 tablet; Refill: 1  Preterm labor symptoms and general obstetric precautions including but not limited to vaginal bleeding, contractions, leaking of fluid and fetal movement were reviewed in detail with the patient. Please refer to After Visit Summary for other counseling recommendations.   Return in about 2 weeks (around 01/03/2020) for LOB, In-Person, any provider.  No future appointments.  Vonzella Nipple, PA-C

## 2019-12-20 NOTE — Patient Instructions (Signed)
Fetal Movement Counts Patient Name: ________________________________________________ Patient Due Date: ____________________ What is a fetal movement count?  A fetal movement count is the number of times that you feel your baby move during a certain amount of time. This may also be called a fetal kick count. A fetal movement count is recommended for every pregnant woman. You may be asked to start counting fetal movements as early as week 28 of your pregnancy. Pay attention to when your baby is most active. You may notice your baby's sleep and wake cycles. You may also notice things that make your baby move more. You should do a fetal movement count:  When your baby is normally most active.  At the same time each day. A good time to count movements is while you are resting, after having something to eat and drink. How do I count fetal movements? 1. Find a quiet, comfortable area. Sit, or lie down on your side. 2. Write down the date, the start time and stop time, and the number of movements that you felt between those two times. Take this information with you to your health care visits. 3. Write down your start time when you feel the first movement. 4. Count kicks, flutters, swishes, rolls, and jabs. You should feel at least 10 movements. 5. You may stop counting after you have felt 10 movements, or if you have been counting for 2 hours. Write down the stop time. 6. If you do not feel 10 movements in 2 hours, contact your health care provider for further instructions. Your health care provider may want to do additional tests to assess your baby's well-being. Contact a health care provider if:  You feel fewer than 10 movements in 2 hours.  Your baby is not moving like he or she usually does. Date: ____________ Start time: ____________ Stop time: ____________ Movements: ____________ Date: ____________ Start time: ____________ Stop time: ____________ Movements: ____________ Date: ____________  Start time: ____________ Stop time: ____________ Movements: ____________ Date: ____________ Start time: ____________ Stop time: ____________ Movements: ____________ Date: ____________ Start time: ____________ Stop time: ____________ Movements: ____________ Date: ____________ Start time: ____________ Stop time: ____________ Movements: ____________ Date: ____________ Start time: ____________ Stop time: ____________ Movements: ____________ Date: ____________ Start time: ____________ Stop time: ____________ Movements: ____________ Date: ____________ Start time: ____________ Stop time: ____________ Movements: ____________ This information is not intended to replace advice given to you by your health care provider. Make sure you discuss any questions you have with your health care provider. Document Revised: 01/04/2019 Document Reviewed: 01/04/2019 Elsevier Patient Education  2020 [employer] Braxton Hicks Contractions Contractions of the uterus can occur throughout pregnancy, but they are not always a sign that you are in labor. You may have practice contractions called Braxton Hicks contractions. These false labor contractions are sometimes confused with true labor. What are Braxton Hicks contractions? Braxton Hicks contractions are tightening movements that occur in the muscles of the uterus before labor. Unlike true labor contractions, these contractions do not result in opening (dilation) and thinning of the cervix. Toward the end of pregnancy (32-34 weeks), Braxton Hicks contractions can happen more often and may become stronger. These contractions are sometimes difficult to tell apart from true labor because they can be very uncomfortable. You should not feel embarrassed if you go to the hospital with false labor. Sometimes, the only way to tell if you are in true labor is for your health care provider to look for changes in the cervix. The health care provider   will do a physical exam and may  monitor your contractions. If you are not in true labor, the exam should show that your cervix is not dilating and your water has not broken. If there are no other health problems associated with your pregnancy, it is completely safe for you to be sent home with false labor. You may continue to have Braxton Hicks contractions until you go into true labor. How to tell the difference between true labor and false labor True labor  Contractions last 30-70 seconds.  Contractions become very regular.  Discomfort is usually felt in the top of the uterus, and it spreads to the lower abdomen and low back.  Contractions do not go away with walking.  Contractions usually become more intense and increase in frequency.  The cervix dilates and gets thinner. False labor  Contractions are usually shorter and not as strong as true labor contractions.  Contractions are usually irregular.  Contractions are often felt in the front of the lower abdomen and in the groin.  Contractions may go away when you walk around or change positions while lying down.  Contractions get weaker and are shorter-lasting as time goes on.  The cervix usually does not dilate or become thin. Follow these instructions at home:   Take over-the-counter and prescription medicines only as told by your health care provider.  Keep up with your usual exercises and follow other instructions from your health care provider.  Eat and drink lightly if you think you are going into labor.  If Braxton Hicks contractions are making you uncomfortable: ? Change your position from lying down or resting to walking, or change from walking to resting. ? Sit and rest in a tub of warm water. ? Drink enough fluid to keep your urine pale yellow. Dehydration may cause these contractions. ? Do slow and deep breathing several times an hour.  Keep all follow-up prenatal visits as told by your health care provider. This is important. Contact a  health care provider if:  You have a fever.  You have continuous pain in your abdomen. Get help right away if:  Your contractions become stronger, more regular, and closer together.  You have fluid leaking or gushing from your vagina.  You pass blood-tinged mucus (bloody show).  You have bleeding from your vagina.  You have low back pain that you never had before.  You feel your baby's head pushing down and causing pelvic pressure.  Your baby is not moving inside you as much as it used to. Summary  Contractions that occur before labor are called Braxton Hicks contractions, false labor, or practice contractions.  Braxton Hicks contractions are usually shorter, weaker, farther apart, and less regular than true labor contractions. True labor contractions usually become progressively stronger and regular, and they become more frequent.  Manage discomfort from Braxton Hicks contractions by changing position, resting in a warm bath, drinking plenty of water, or practicing deep breathing. This information is not intended to replace advice given to you by your health care provider. Make sure you discuss any questions you have with your health care provider. Document Revised: 04/29/2017 Document Reviewed: 09/30/2016 Elsevier Patient Education  2020 [employer] Inc[employer]  

## 2020-01-11 ENCOUNTER — Encounter: Payer: Medicaid Other | Admitting: Family Medicine

## 2020-01-13 IMAGING — US US MFM FETAL NUCHAL TRANSLUCENCY
2 of 3 series · 14 of 28 positions shown · non-contrast
Comparison: none

[Series 1: us mfm fetal nuchal translucency · 13 of 37 slices shown (1 of 2)]
[im 2/37]
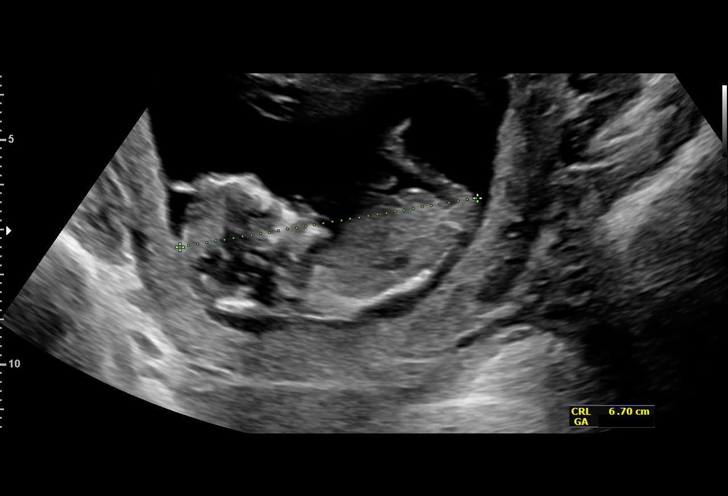
[im 5/37]
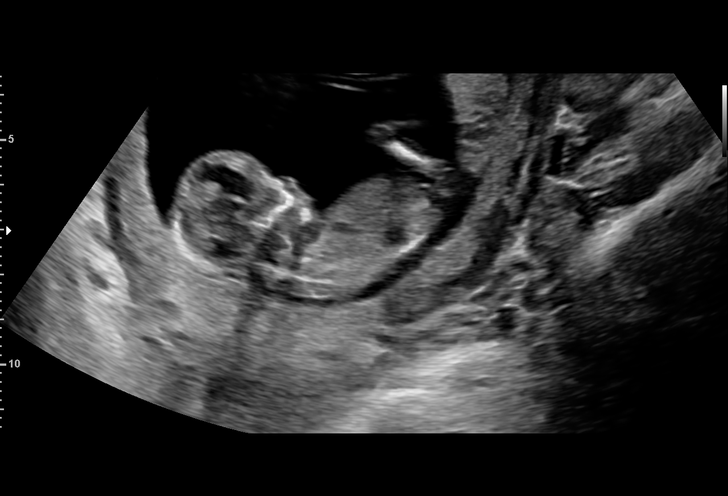
[im 8/37]
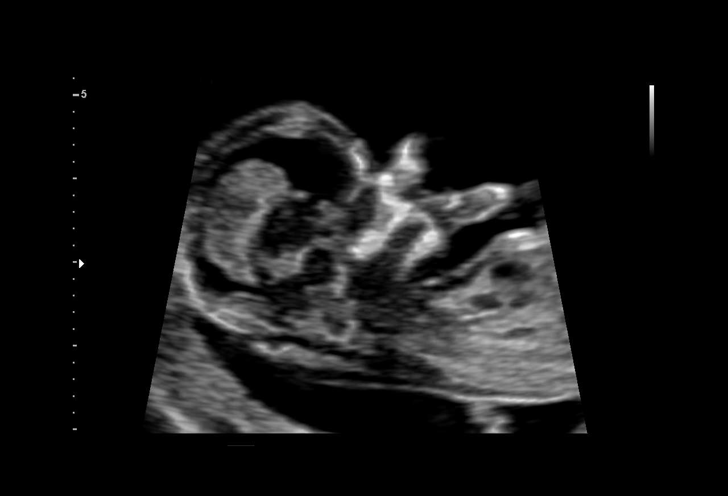
[im 11/37]
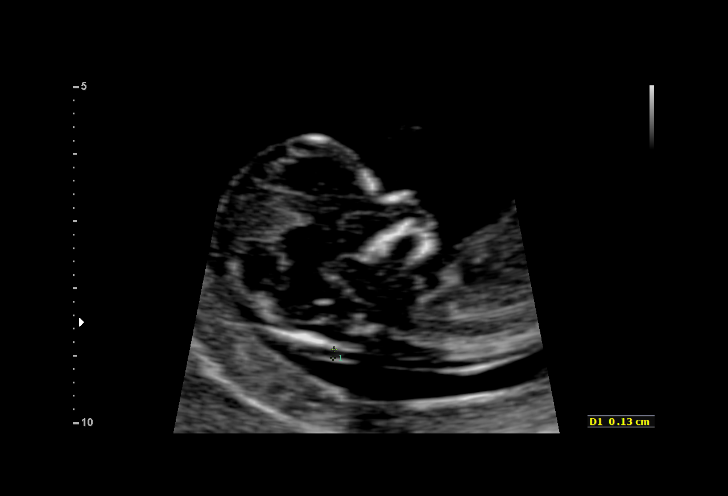
[im 13/37]
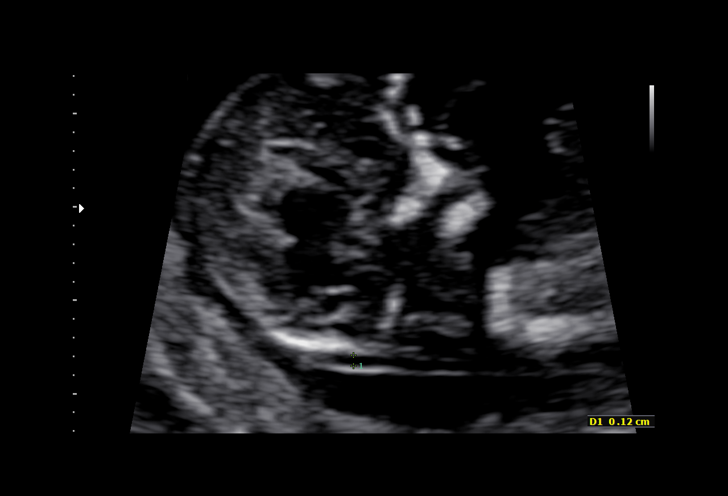
[im 16/37]
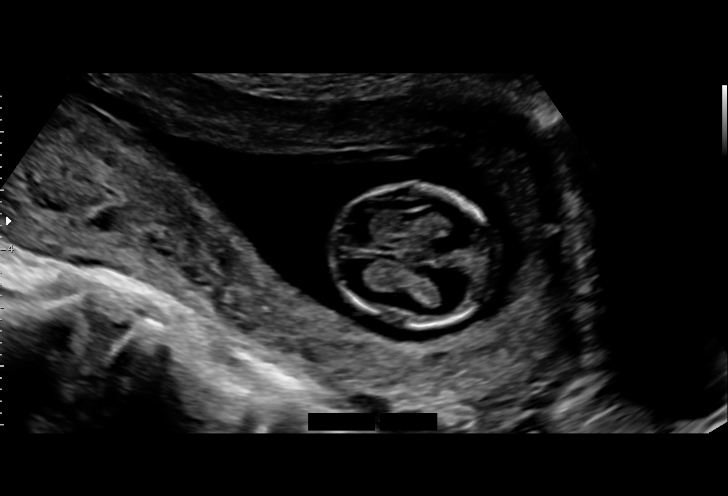
[im 19/37]
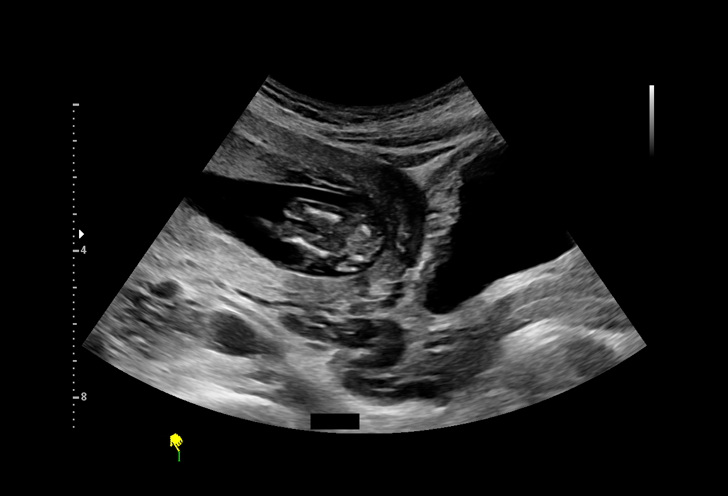
[im 22/37]
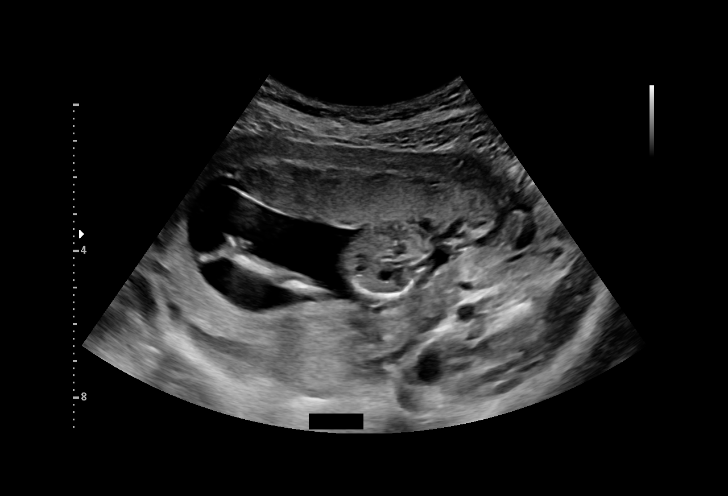
[im 25/37]
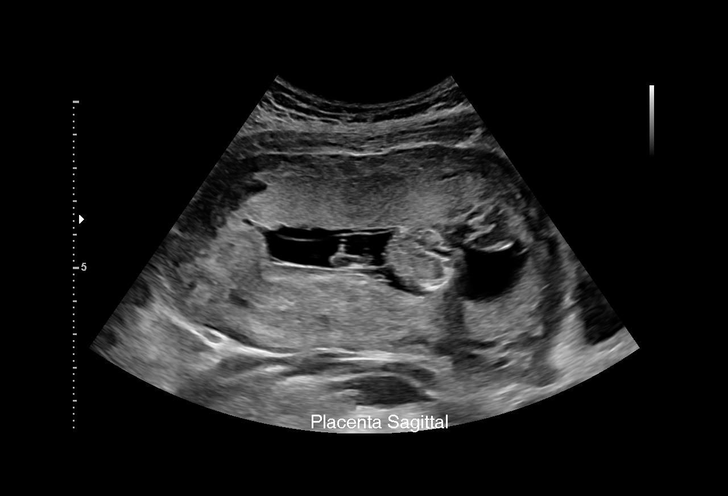
[im 28/37]
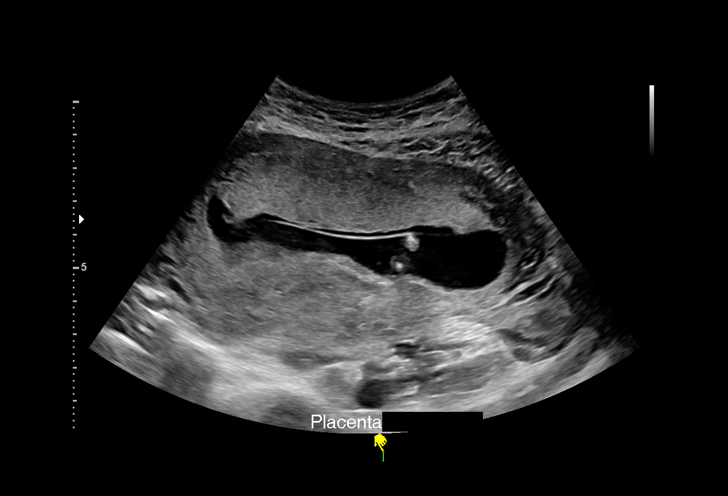
[im 31/37]
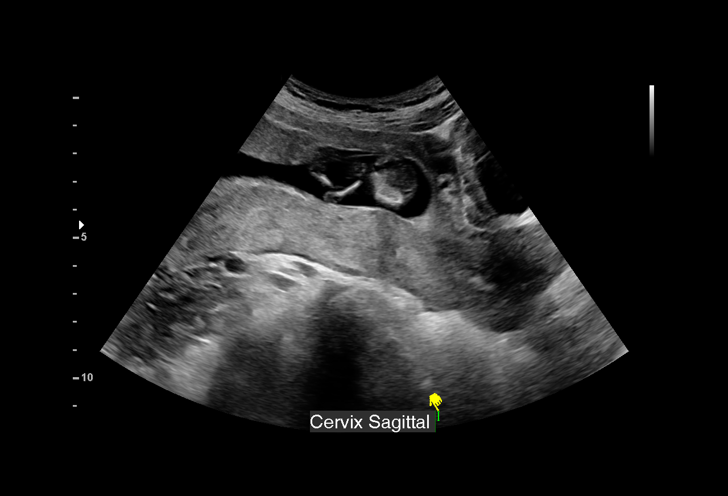
[im 34/37]
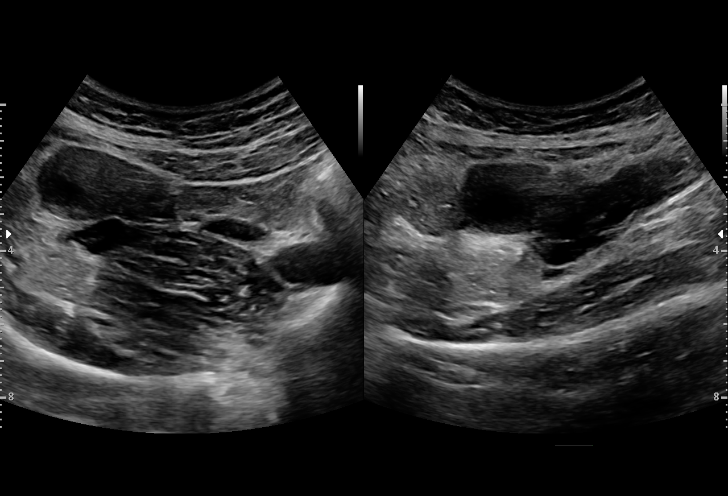
[im 37/37]
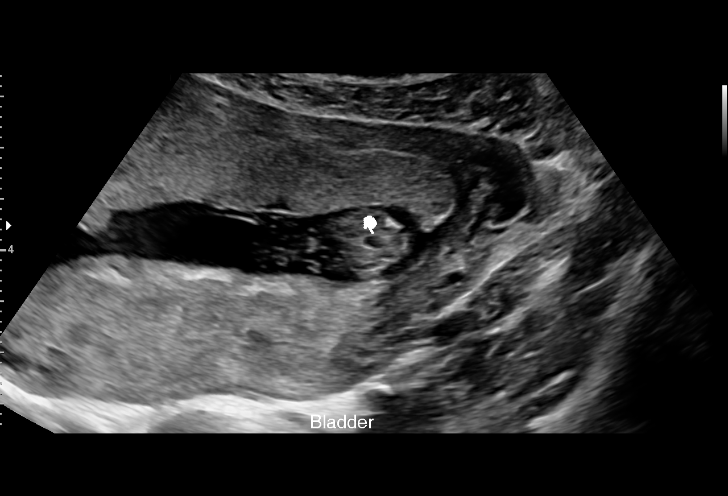

[Series 4: us mfm fetal nuchal translucency · 1 of 1 slices shown (2 of 2)]
[im 1/1]
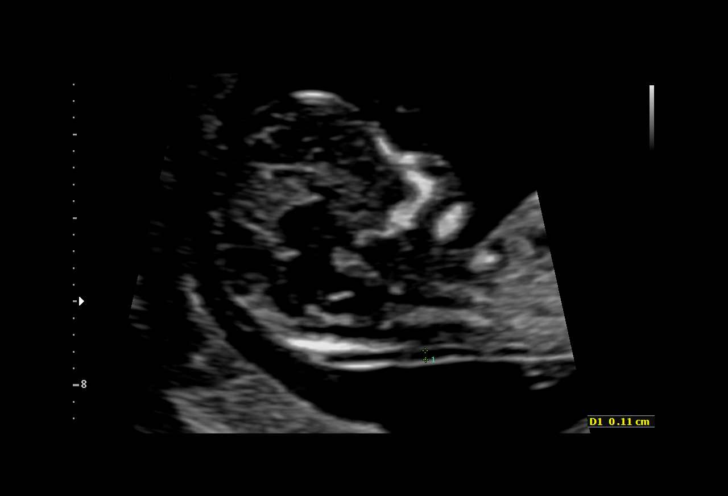

[14 of 28 positions shown; findings below may reference images not displayed]

Health
                   GAMBIER NP

     TRANSLUCENCY                                      GAMBIER
 ----------------------------------------------------------------------

 ----------------------------------------------------------------------
Indications

  Encounter for nuchal translucency
  13 weeks gestation of pregnancy
 ----------------------------------------------------------------------
Vital Signs

 BMI:         22.67        Pulse:  83
 BP:          106/70
Fetal Evaluation

 Num Of Fetuses:         1
 Fetal Heart Rate(bpm):  151
 Cardiac Activity:       Observed
 Presentation:           Variable
 Placenta:               Anterior

 Amniotic Fluid
 AFI FV:      Within normal limits
Biometry

 CRL:        67  mm     G. Age:  12w 6d                  EDD:   10/12/18
OB History

 Gravidity:    3         Term:   2
 Living:       2
Gestational Age
 LMP:           13w 2d        Date:  01/02/18                 EDD:   10/09/18
 Best:          13w 2d     Det. By:  LMP  (01/02/18)          EDD:   10/09/18
1st Trimester Genetic Sonogram Screening

 CRL:              67  mm    G. Age:   12w 6d                 EDD:   10/12/18
 Nuc Trans:       1.3  mm

 Nasal Bone:                 Present
Anatomy

 Choroid Plexus:        Appears normal         Upper Extremities:      Visualized
 Stomach:               Appears normal, left   Lower Extremities:      Visualized
                        sided
 Bladder:               Appears normal
Cervix Uterus Adnexa

 Cervix
 Normal appearance by transabdominal scan.

 Uterus
 No abnormality visualized.

 Left Ovary
 Size(cm)       2.6  x   2.2    x  2.5       Vol(ml):
 Within normal limits.

 Right Ovary
 Size(cm)        4   x    2     x  2.5       Vol(ml):
 Within normal limits.
Comments

 U/S images reviewed. Findings reviewed with patient.   NT
 measure 1.3 mm.  No  fetal abnormalities are seen.
 General counseling was then performed regarding first
 trimester Down screening/Part I Sequential Screening.
 ACOG recommends offering first trimester Down screening to
 all women.  The association of Trisomy 21 with mental
 retardation and various birth defects was explained.   MS-
 AFP/Part II of the sequential screen should be performed @
 15 - 18 weeks followed by a detailed anatomic survey.  When
 the NT is not obtainable, a serum integrated screen may be
 performed.

 Questions answered.
 10 minutes spent face to face with patient.
 Recommendations: 1) First Screen
Recommendations

 First Screen

              Bassa, Paul Rodrigue

## 2020-01-29 ENCOUNTER — Other Ambulatory Visit: Payer: Self-pay

## 2020-01-29 ENCOUNTER — Ambulatory Visit (INDEPENDENT_AMBULATORY_CARE_PROVIDER_SITE_OTHER): Payer: Medicaid Other | Admitting: Advanced Practice Midwife

## 2020-01-29 ENCOUNTER — Encounter: Payer: Self-pay | Admitting: Advanced Practice Midwife

## 2020-01-29 VITALS — BP 123/73 | HR 79 | Wt 150.8 lb

## 2020-01-29 DIAGNOSIS — Z3A35 35 weeks gestation of pregnancy: Secondary | ICD-10-CM

## 2020-01-29 DIAGNOSIS — D573 Sickle-cell trait: Secondary | ICD-10-CM

## 2020-01-29 DIAGNOSIS — O0990 Supervision of high risk pregnancy, unspecified, unspecified trimester: Secondary | ICD-10-CM

## 2020-01-29 LAB — POCT URINALYSIS DIP (DEVICE)
Bilirubin Urine: NEGATIVE
Glucose, UA: NEGATIVE mg/dL
Hgb urine dipstick: NEGATIVE
Ketones, ur: NEGATIVE mg/dL
Leukocytes,Ua: NEGATIVE
Nitrite: NEGATIVE
Protein, ur: NEGATIVE mg/dL
Specific Gravity, Urine: 1.015 (ref 1.005–1.030)
Urobilinogen, UA: 0.2 mg/dL (ref 0.0–1.0)
pH: 6 (ref 5.0–8.0)

## 2020-01-29 NOTE — Patient Instructions (Signed)
COVID-19 Vaccination if You Are Pregnant or Breastfeeding ° °The Society for Maternal-Fetal Medicine (SMFM) and other pregnancy experts recommend that pregnant and lactating people be vaccinated against COVID-19. The Centers for Disease Control and Prevention (CDC) also recommend vaccination for “all people aged 30 years and older, including people who are pregnant, breastfeeding, trying to get pregnant now, or might become pregnant in the future.” Vaccination is the best way to reduce the risks of COVID-19 infection and COVID-related complications for both you and your baby. ° °Three vaccines are available to prevent COVID-19: °• The two-dose Pfizer vaccine for people 12 years and older--APPROVED by the [place] Food and Drug Administration on January 21, 2020 °• The two-dose Moderna vaccine for people 18 years and older--AUTHORIZED for emergency use °• The one-dose Johnson & Johnson vaccine for people 18 years and older (you may also see this vaccine referred to as the “Janssen vaccine”)--AUTHORIZED for emergency use ° °For those receiving the Pfizer and Moderna vaccines, the second dose is given 21 days (Pfizer) and 28 days (Moderna) after the first dose. The Johnson & Johnson vaccine is only one dose. ° °Information for Pregnant Individuals °If you are pregnant or planning to become pregnant and are thinking about getting vaccinated, consider talking with your health care professional about the vaccine.  ° °To help with your decision, you should consider the following key points: °Anyone can get the COVID vaccines free of charge regardless of immigration status or whether they have insurance. You may be asked for your social security number, but it is  °NOT required to get vaccinated. ° °What are benefits of getting the COVID-19 vaccines during pregnancy?  °• The vaccines can help protect you from getting COVID-19. With the two-dose vaccines, you must get both doses for maximum effectiveness. It’s not yet known how  long protection lasts. ° °• Another potential benefit is that getting the vaccine while pregnant may help you pass antiCOVID-19 antibodies to your baby. In numerous studies of vaccinated moms, antibodies were found in the umbilical cord blood of babies and in the mother’s breastmilk. ° °• The CDC, along with other federal partners, are monitoring people who have been vaccinated for serious side effects. So far, more than 139,000 pregnant people have been °vaccinated. No unexpected pregnancy or fetal problems have occurred. There have been no reports of any increased risk of pregnancy loss, growth problems, or birth defects. ° °• A safe vaccine is generally considered one in which the benefits of being vaccinated outweigh the risks. The current vaccines are not live vaccines. There is only a very small  °chance that they cross the placenta, so it’s unlikely that they even reach the fetus. Vaccines don’t affect future fertility. The only people who should NOT get vaccinated are those who have had a severe allergic reaction to vaccines in the past or any vaccine ingredients. ° °• Side effects may occur in the first 3 days after getting vaccinated.1 These include mild to moderate fever, headache, and muscle aches. Side effects may be worse after the second dose of the Pfizer and Moderna vaccines. Fever should be avoided during pregnancy,especially in the first trimester. Those who develop a fever after vaccination can take °acetaminophen (Tylenol). This medication is safe to use during pregnancy and does not  affect how the vaccine works.  ° °What are the known risks of getting COVID-19 during pregnancy?  °About 1 to 3 per 1,000 pregnant women with COVID-19 will develop severe disease. Compared with those who   aren’t pregnant, pregnant people infected by the COVID-19 virus: °• Are 3 times more likely to need ICU care °• Are 2 to 3 times more likely to need advanced life support and a breathing tube  °• Have a small  increased risk of dying due to COVID-19 °They may also be at increased risk of stillbirth and preterm birth. ° °What is my risk of getting COVID-19?  °Your risk of getting COVID-19 depends on the chance that you will come into contact with another infected person. The risk may be higher if you live in a community where there is a lot of COVID-19 infection or work in healthcare or another high-contact setting.  ° °What is my risk for severe complications if I get COVID-19?  °Data show that older pregnant women; those with preexisting health conditions, such as a body mass index higher than 35 kg/m2, diabetes, and heart disorders; and Black or Latinx women have an especially increased risk of severe disease and death from COVID-19. °  °If you still have questions about the vaccines or need more information, ask your health care provider or go to the Centers for Disease Control and Prevention’s COVID-19 vaccine webpage.  ° °An Update on the Johnson & Johnson Vaccine  ° °In April 2021, the FDA and CDC called for a brief pause to use of the Johnson & Johnson vaccine. They did so after reports of a severe side effect in a very small number of women younger than age 50 following vaccination. This side effect, called thrombosis with thrombocytopenia syndrome (TTS), causes blood clots (thrombosis) combined with low levels of platelets (thrombocytopenia). ° °TTS following the Johnson & Johnson vaccine is extremely rare. At the time of this update, it has occurred in only 7 people per 1 million Johnson & Johnson shots given. According to the CDC, being on hormonal birth control (the pill, patch, or ring), pregnancy, breastfeeding, or being recently pregnant does not make you more likely to develop TTS after getting the Johnson & Johnson vaccine. The pause was lifted on September 21, 2019, after the FDA and CDC determined that the known benefits of the Johnson & Johnson vaccine far outweigh the risks.  ° °Health care professionals  have been alerted to the possibility of this side effect in people who have received the Johnson & Johnson vaccine. National organizations continue to recommend COVID-19 vaccination with any of the vaccines for pregnant women. All women younger than age 50 years, whether pregnant, breastfeeding, or not, should be aware of the very rare risk of TTS after getting the Johnson & Johnson vaccine. The Pfizer and Moderna vaccines don’t have this risk. If you get the Johnson & Johnson vaccine,  °seek medical help right away if you develop any of the following symptoms within 3 weeks of getting your shot: ° °• Severe or persistent headaches or blurred vision °• Shortness of breath °• Chest pain °• Leg swelling °• Persistent abdominal pain °• Easy bruising or tiny blood spots under the skin beyond the injection site ° °Experts continue to collect health and safety information from pregnant people who have been vaccinated. If you have questions about vaccination during pregnancy, visit the CDC website or talk to your health care professional. Information for Breastfeeding/Lactating Individuals The Society for Maternal-Fetal Medicine and other pregnancy experts recommend COVID-19 vaccination for people who are breastfeeding/lactating. You don’t have to delay or stop  °breastfeeding just because you get vaccinated.  ° °Getting Vaccinated  °You can get vaccinated at   any time during pregnancy. The CDC is committed to monitoring the vaccine’s safety for all individuals. Your health professional or vaccine clinic may give you information about enrolling in the v-safe after vaccination health checker (see the box below).Even after you’re fully vaccinated, it is important to follow the CDC’s guidance for wearing a mask indoors in areas where there are substantial or high rates of COVID-19 infection.  ° °What Happens When You Enroll in v-Safe?  °The v-safe after vaccination health checker program lets the CDC check in with you after  your vaccination. At sign-up, you can indicate that you are pregnant. Once you do that, expect the following: °• Someone may call you from the v-safe program to ask initial questions and get more information. °• You may be asked to enroll in the vaccine pregnancy registry, which is collecting information about any effects of the vaccine during pregnancy. This is a great way to help scientists monitor the vaccine’s safety and effectiveness.  ° °References °1. Oliver SE, Gargano JW, Marin M, Wallace M, Curran KG, Chamberland M, et al. The Advisory Committee on Immunization Practices’ Interim Recommendation for Use of Pfizer-BioNTech  °COVID-19 Vaccine -- [place], December 2020. MMWR Morbidity and Mortality Weekly Report 2020;69. °2. FDA Briefing Document. Janssen Ad26.COV2.S Vaccine for the Prevention of COVID-19. 2021. Accessed Aug 03, 2019; Available from: https://www.fda.gov/media/146217/download °3. PFIZER-BIONTECH COVID-19 VACCINE [package insert] [place]: Pfizer and [place], [place]: Biontech;2020. °4. FDA Briefing Document. Moderna COVID-19 Vaccine. 2020. Accessed 2020, Dec 18; Available from: https://www.fda.gov/media/144434/download  °5. Gray KJ, Bordt EA, Atyeo C, Deriso E, Akinwunmi B, Young N, et al. COVID-19 vaccine response in pregnant and lactating women: a cohort study. Am J Obstet Gynecol 2021 Mar 24. °6. Panagiotakopoulos L, Myers TR, Gee J, Lipkind HS, Kharbanda EO, Ryan DS, et al. SARS-CoV-2 Infection Among Hospitalized Pregnant Women: Reasons for Admission and Pregnancy  °Characteristics - [facility], March 1-Oct 28, 2018. MMWR Morb Mortal Wkly Rep 2020 Sep 23;69(38):1355-9. °7. Zambrano LD, Ellington S, Strid P, Galang RR, Oduyebo T, Tong VT, et al. Update: Characteristics of Symptomatic Women of Reproductive Age with Laboratory-Confirmed SARSCoV-2 Infection by Pregnancy Status - [place], January 22-March 03, 2019. MMWR Morb Mortal Wkly Rep 2020 Nov  6;69(44):1641-7. °8. Delahoy MJ, Whitaker M, O'Halloran A, Chai SJ, Kirley PD, Alden N, et al. Characteristics and Maternal and Birth Outcomes of Hospitalized Pregnant Women with Laboratory-Confirmed COVID-19 - COVID-NET, [place], March 1-January 20, 2019. MMWR Morb Mortal Wkly Rep 2020 Sep 25;69(38):1347-54. ° °

## 2020-01-29 NOTE — Progress Notes (Signed)
   PRENATAL VISIT NOTE  Subjective:  Shirley Baker is a 29 y.o. [phone] at [redacted]w[redacted]d being seen today for ongoing prenatal care.  She is currently monitored for the following issues for this low-risk pregnancy and has Sickle cell trait (HCC); History of low birth weight; Nausea and vomiting in pregnancy; Supervision of high risk pregnancy, antepartum; Short interval between pregnancies affecting pregnancy, antepartum; and Heart murmur on their problem list.  Patient reports no complaints.  Contractions: Not present. Vag. Bleeding: None.  Movement: Present. Denies leaking of fluid.   The following portions of the patient's history were reviewed and updated as appropriate: allergies, current medications, past family history, past medical history, past social history, past surgical history and problem list.   Objective:   Vitals:   01/29/20 1128  BP: 123/73  Pulse: 79  Weight: 150 lb 12.8 oz (68.4 kg)    Fetal Status: Fetal Heart Rate (bpm): 126 Fundal Height: 36 cm Movement: Present     General:  Alert, oriented and cooperative. Patient is in no acute distress.  Skin: Skin is warm and dry. No rash noted.   Cardiovascular: Normal heart rate noted  Respiratory: Normal respiratory effort, no problems with respiration noted  Abdomen: Soft, gravid, appropriate for gestational age.  Pain/Pressure: Present     Pelvic: Cervical exam deferred        Extremities: Normal range of motion.  Edema: Trace  Mental Status: Normal mood and affect. Normal behavior. Normal judgment and thought content.   Assessment and Plan:  Pregnancy: G4P3003 at [redacted]w[redacted]d 1. Supervision of high risk pregnancy, antepartum - GBS at next visit   2. [redacted] weeks gestation of pregnancy - Covid vaccine recommended and SMFM handout provided  - Patient had 1st dose of Moderna in June. She has not been back for 2nd dose.   3. Sickle cell trait (HCC) - Culture, OB Urine  Preterm labor symptoms and general obstetric precautions  including but not limited to vaginal bleeding, contractions, leaking of fluid and fetal movement were reviewed in detail with the patient. Please refer to After Visit Summary for other counseling recommendations.   Return in about 1 week (around 02/05/2020) for in person visit for GBS .  No future appointments.  Thressa Sheller DNP, CNM  01/29/20  11:43 AM

## 2020-02-05 ENCOUNTER — Encounter: Payer: Medicaid Other | Admitting: Advanced Practice Midwife

## 2020-02-09 ENCOUNTER — Inpatient Hospital Stay (HOSPITAL_BASED_OUTPATIENT_CLINIC_OR_DEPARTMENT_OTHER): Payer: Medicaid Other

## 2020-02-09 ENCOUNTER — Encounter (HOSPITAL_COMMUNITY): Payer: Self-pay | Admitting: Obstetrics and Gynecology

## 2020-02-09 ENCOUNTER — Other Ambulatory Visit: Payer: Self-pay

## 2020-02-09 ENCOUNTER — Inpatient Hospital Stay (EMERGENCY_DEPARTMENT_HOSPITAL)
Admission: AD | Admit: 2020-02-09 | Discharge: 2020-02-09 | Disposition: A | Discharge status: Home / Self Care | Payer: Medicaid Other | Source: Home / Self Care | Attending: Obstetrics and Gynecology | Admitting: Obstetrics and Gynecology

## 2020-02-09 DIAGNOSIS — Z862 Personal history of diseases of the blood and blood-forming organs and certain disorders involving the immune mechanism: Secondary | ICD-10-CM | POA: Diagnosis not present

## 2020-02-09 DIAGNOSIS — O09293 Supervision of pregnancy with other poor reproductive or obstetric history, third trimester: Secondary | ICD-10-CM | POA: Diagnosis not present

## 2020-02-09 DIAGNOSIS — Z3689 Encounter for other specified antenatal screening: Secondary | ICD-10-CM

## 2020-02-09 DIAGNOSIS — O368130 Decreased fetal movements, third trimester, not applicable or unspecified: Secondary | ICD-10-CM

## 2020-02-09 DIAGNOSIS — O09893 Supervision of other high risk pregnancies, third trimester: Secondary | ICD-10-CM

## 2020-02-09 DIAGNOSIS — Z3A37 37 weeks gestation of pregnancy: Secondary | ICD-10-CM

## 2020-02-09 DIAGNOSIS — R109 Unspecified abdominal pain: Secondary | ICD-10-CM | POA: Insufficient documentation

## 2020-02-09 DIAGNOSIS — O26899 Other specified pregnancy related conditions, unspecified trimester: Secondary | ICD-10-CM

## 2020-02-09 DIAGNOSIS — R197 Diarrhea, unspecified: Secondary | ICD-10-CM

## 2020-02-09 DIAGNOSIS — R519 Headache, unspecified: Secondary | ICD-10-CM | POA: Insufficient documentation

## 2020-02-09 DIAGNOSIS — Z362 Encounter for other antenatal screening follow-up: Secondary | ICD-10-CM

## 2020-02-09 DIAGNOSIS — O99891 Other specified diseases and conditions complicating pregnancy: Secondary | ICD-10-CM

## 2020-02-09 DIAGNOSIS — O26893 Other specified pregnancy related conditions, third trimester: Secondary | ICD-10-CM | POA: Insufficient documentation

## 2020-02-09 LAB — URINALYSIS, ROUTINE W REFLEX MICROSCOPIC
Bilirubin Urine: NEGATIVE
Glucose, UA: NEGATIVE mg/dL
Hgb urine dipstick: NEGATIVE
Ketones, ur: 80 mg/dL — AB
Leukocytes,Ua: NEGATIVE
Nitrite: NEGATIVE
Protein, ur: NEGATIVE mg/dL
Specific Gravity, Urine: 1.008 (ref 1.005–1.030)
pH: 6 (ref 5.0–8.0)

## 2020-02-09 NOTE — MAU Provider Note (Signed)
History     CSN: [id number]  Arrival date and time: 02/09/20 4818   First Provider Initiated Contact with Patient 02/09/20 0900      Chief Complaint  Patient presents with   Abdominal Pain   Decreased Fetal Movement   Headache   HPI Shirley Baker is a 29 y.o. G4P3003 at [redacted]w[redacted]d who presents with multiple complaints. She reports diarrhea that started yesterday. She reports more than 5 loose stools in the last 24 hours. She last went around 1.5 hours ago. She is able to eat and drink water without difficulty. She also reports a headache that "is common for me." She is reporting abdominal pain that she rates a 10/10. She is unsure if this is related to diarrhea or contractions. She states the contractions are sometimes bad. She also reports decreased fetal movement. She states she has not felt the baby move at all today. She denies any leaking or vaginal bleeding.   OB History     Gravida  4   Para  3   Term  3   Preterm      AB      Living  3      SAB      TAB      Ectopic      Multiple  0   Live Births  3        Obstetric Comments  LBW         Past Medical History:  Diagnosis Date   Anemia    Heart murmur    Ovarian cyst    Syncope     Past Surgical History:  Procedure Laterality Date   NO PAST SURGERIES      Family History  Problem Relation Age of Onset   Healthy Mother    Healthy Father    Hearing loss Neg Hx     Social History   Tobacco Use   Smoking status: Never Smoker   Smokeless tobacco: Never Used   Tobacco comment: hookah, last Aug 2019- patient denies 08/16/2019  Vaping Use   Vaping Use: Never used  Substance Use Topics   Alcohol use: No   Drug use: No    Allergies: No Known Allergies  No medications prior to admission.    Review of Systems  Constitutional: Negative.  Negative for fatigue and fever.  Respiratory: Negative.  Negative for shortness of breath.   Cardiovascular: Negative.  Negative for chest pain.   Gastrointestinal: Positive for abdominal pain and diarrhea. Negative for constipation, nausea and vomiting.  Genitourinary: Negative.  Negative for dysuria, vaginal bleeding and vaginal discharge.  Neurological: Positive for headaches. Negative for dizziness.   Physical Exam   Blood pressure 124/85, pulse 78, temperature 98.1 F (36.7 C), temperature source Oral, resp. rate 18, last menstrual period 05/24/2019, SpO2 100 %, not currently breastfeeding.  Physical Exam Vitals and nursing note reviewed.  Constitutional:      General: She is not in acute distress.    Appearance: She is well-developed.  HENT:     Head: Normocephalic.  Eyes:     Pupils: Pupils are equal, round, and reactive to light.  Cardiovascular:     Rate and Rhythm: Normal rate and regular rhythm.     Heart sounds: Normal heart sounds.  Pulmonary:     Effort: Pulmonary effort is normal. No respiratory distress.     Breath sounds: Normal breath sounds.  Abdominal:     General: Bowel sounds are normal. There is no  distension.     Palpations: Abdomen is soft.     Tenderness: There is no abdominal tenderness.  Skin:    General: Skin is warm and dry.  Neurological:     Mental Status: She is alert and oriented to person, place, and time.  Psychiatric:        Behavior: Behavior normal.        Thought Content: Thought content normal.        Judgment: Judgment normal.    Fetal Tracing:  Baseline: 120 Variability: moderate Accels: 15x15 Decels: none  Toco: 2-8  Dilation: Fingertip Effacement (%): Thick Cervical Position: Posterior Station: Ballotable Exam by:: Haskel Schroeder RN    MAU Course  Procedures Results for orders placed or performed during the hospital encounter of 02/09/20 (from the past 24 hour(s))  Urinalysis, Routine w reflex microscopic Urine, Clean Catch     Status: Abnormal   Collection Time: 02/09/20  9:33 AM  Result Value Ref Range   Color, Urine YELLOW YELLOW   APPearance CLEAR CLEAR    Specific Gravity, Urine 1.008 1.005 - 1.030   pH 6.0 5.0 - 8.0   Glucose, UA NEGATIVE NEGATIVE mg/dL   Hgb urine dipstick NEGATIVE NEGATIVE   Bilirubin Urine NEGATIVE NEGATIVE   Ketones, ur 80 (A) NEGATIVE mg/dL   Protein, ur NEGATIVE NEGATIVE mg/dL   Nitrite NEGATIVE NEGATIVE   Leukocytes,Ua NEGATIVE NEGATIVE    MDM UA- patient unable to leave sample  Offered patient treatment for diarrhea and headache- patient declines stating "it's not that bad"  Patient still reporting no fetal movement despite reactive NST, BPP ordered BPP 8/8 with normal AFI, oblique presentation  Patient missed appointment on 9/7- will collect GBS today  Cervix rechecked and unchanged. IV fluids offered and patient declined. Patient upset that CNM is not delivering patient today. Discussed with patient that she is not in labor and we cannot induce until 39 weeks. Encouraged patient to keep scheduled appointment this week to discuss possible version or IOL at 39 weeks if still in latent labor. Patient demanding to be delivered and CNM reoffered interventions for comfort but patient declined.   Assessment and Plan   1. NST (non-stress test) reactive   2. [redacted] weeks gestation of pregnancy   3. Decreased fetal movements in third trimester, single or unspecified fetus   4. Diarrhea during pregnancy    -Discharge home in stable condition -Labor precautions discussed -Patient advised to follow-up with OB this week as scheduled for prenatal care -Patient may return to MAU as needed or if her condition were to change or worsen   Rolm Bookbinder CNM 02/09/2020, 12:28 PM

## 2020-02-09 NOTE — MAU Note (Addendum)
Patient presented to MAU at [redacted]w[redacted]d gestation with c/o decreased fetal movement, diarrhea, and headache which all started around 0830 yesterday morning. Patient also has c/o abdominal pain, patient unsure if she is feeling contractions, since 0000 today and pain increasing throughout the morning. Patient denies any LOF or vaginal bleeding.

## 2020-02-09 NOTE — Discharge Instructions (Signed)
Safe Medications in Pregnancy  ° °Acne: °Benzoyl Peroxide °Salicylic Acid ° °Backache/Headache: °Tylenol: 2 regular strength every 4 hours OR °             2 Extra strength every 6 hours ° °Colds/Coughs/Allergies: °Benadryl (alcohol free) 25 mg every 6 hours as needed °Breath right strips °Claritin °Cepacol throat lozenges °Chloraseptic throat spray °Cold-Eeze- up to three times per day °Cough drops, alcohol free °Flonase (by prescription only) °Guaifenesin °Mucinex °Robitussin DM (plain only, alcohol free) °Saline nasal spray/drops °Sudafed (pseudoephedrine) & Actifed ** use only after [redacted] weeks gestation and if you do not have high blood pressure °Tylenol °Vicks Vaporub °Zinc lozenges °Zyrtec  ° °Constipation: °Colace °Ducolax suppositories °Fleet enema °Glycerin suppositories °Metamucil °Milk of magnesia °Miralax °Senokot °Smooth move tea ° °Diarrhea: °Kaopectate °Imodium A-D ° °*NO pepto Bismol ° °Hemorrhoids: °Anusol °Anusol HC °Preparation H °Tucks ° °Indigestion: °Tums °Maalox °Mylanta °Zantac  °Pepcid ° °Insomnia: °Benadryl (alcohol free) 25mg every 6 hours as needed °Tylenol PM °Unisom, no Gelcaps ° °Leg Cramps: °Tums °MagGel ° °Nausea/Vomiting:  °Bonine °Dramamine °Emetrol °Ginger extract °Sea bands °Meclizine  °Nausea medication to take during pregnancy:  °Unisom (doxylamine succinate 25 mg tablets) Take one tablet daily at bedtime. If symptoms are not adequately controlled, the dose can be increased to a maximum recommended dose of two tablets daily (1/2 tablet in the morning, 1/2 tablet mid-afternoon and one at bedtime). °Vitamin B6 100mg tablets. Take one tablet twice a day (up to 200 mg per day). ° °Skin Rashes: °Aveeno products °Benadryl cream or 25mg every 6 hours as needed °Calamine Lotion °1% cortisone cream ° °Yeast infection: °Gyne-lotrimin 7 °Monistat 7 ° ° °**If taking multiple medications, please check labels to avoid duplicating the same active ingredients °**take medication as directed on  the label °** Do not exceed 4000 mg of tylenol in 24 hours °**Do not take medications that contain aspirin or ibuprofen ° ° ° ° °Food Choices to Help Relieve Diarrhea, Adult °When you have diarrhea, the foods you eat and your eating habits are very important. Choosing the right foods and drinks can help: °· Relieve diarrhea. °· Replace lost fluids and nutrients. °· Prevent dehydration. °What general guidelines should I follow? ° °Relieving diarrhea °· Choose foods with less than 2 g or .07 oz. of fiber per serving. °· Limit fats to less than 8 tsp (38 g or 1.34 oz.) a day. °· Avoid the following: °? Foods and beverages sweetened with high-fructose corn syrup, honey, or sugar alcohols such as xylitol, sorbitol, and mannitol. °? Foods that contain a lot of fat or sugar. °? Fried, greasy, or spicy foods. °? High-fiber grains, breads, and cereals. °? Raw fruits and vegetables. °· Eat foods that are rich in probiotics. These foods include dairy products such as yogurt and fermented milk products. They help increase healthy bacteria in the stomach and intestines (gastrointestinal tract, or GI tract). °· If you have lactose intolerance, avoid dairy products. These may make your diarrhea worse. °· Take medicine to help stop diarrhea (antidiarrheal medicine) only as told by your health care provider. °Replacing nutrients °· Eat small meals or snacks every 3-4 hours. °· Eat bland foods, such as white rice, toast, or baked potato, until your diarrhea starts to get better. Gradually reintroduce nutrient-rich foods as tolerated or as told by your health care provider. This includes: °? Well-cooked protein foods. °? Peeled, seeded, and soft-cooked fruits and vegetables. °? Low-fat dairy products. °· Take vitamin and mineral supplements as told   by your health care provider. Preventing dehydration  Start by sipping water or a special solution to prevent dehydration (oral rehydration solution, ORS). Urine that is clear or pale  yellow means that you are getting enough fluid.  Try to drink at least 8-10 cups of fluid each day to help replace lost fluids.  You may add other liquids in addition to water, such as clear juice or decaffeinated sports drinks, as tolerated or as told by your health care provider.  Avoid drinks with caffeine, such as coffee, tea, or soft drinks.  Avoid alcohol. What foods are recommended?     The items listed may not be a complete list. Talk with your health care provider about what dietary choices are best for you. Grains White rice. White, [place], or pita breads (fresh or toasted), including plain rolls, buns, or bagels. White pasta. Saltine, soda, or graham crackers. Pretzels. Low-fiber cereal. Cooked cereals made with water (such as cornmeal, farina, or cream cereals). Plain muffins. Matzo. Melba toast. Zwieback. Vegetables Potatoes (without the skin). Most well-cooked and canned vegetables without skins or seeds. Tender lettuce. Fruits Apple sauce. Fruits canned in juice. Cooked apricots, cherries, grapefruit, peaches, pears, or plums. Fresh bananas and cantaloupe. Meats and other protein foods Baked or boiled chicken. Eggs. Tofu. Fish. Seafood. Smooth nut butters. Ground or well-cooked tender beef, ham, veal, lamb, pork, or poultry. Dairy Plain yogurt, kefir, and unsweetened liquid yogurt. Lactose-free milk, buttermilk, skim milk, or soy milk. Low-fat or nonfat hard cheese. Beverages Water. Low-calorie sports drinks. Fruit juices without pulp. Strained tomato and vegetable juices. Decaffeinated teas. Sugar-free beverages not sweetened with sugar alcohols. Oral rehydration solutions, if approved by your health care provider. Seasoning and other foods Bouillon, broth, or soups made from recommended foods. What foods are not recommended? The items listed may not be a complete list. Talk with your health care provider about what dietary choices are best for you. Grains Whole grain,  whole wheat, bran, or rye breads, rolls, pastas, and crackers. Wild or brown rice. Whole grain or bran cereals. Barley. Oats and oatmeal. Corn tortillas or taco shells. Granola. Popcorn. Vegetables Raw vegetables. Fried vegetables. Cabbage, broccoli, Brussels sprouts, artichokes, baked beans, beet greens, corn, kale, legumes, peas, sweet potatoes, and yams. Potato skins. Cooked spinach and cabbage. Fruits Dried fruit, including raisins and dates. Raw fruits. Stewed or dried prunes. Canned fruits with syrup. Meat and other protein foods Fried or fatty meats. Deli meats. Chunky nut butters. Nuts and seeds. Beans and lentils. Tomasa Blase. Hot dogs. Sausage. Dairy High-fat cheeses. Whole milk, chocolate milk, and beverages made with milk, such as milk shakes. Half-and-half. Cream. sour cream. Ice cream. Beverages Caffeinated beverages (such as coffee, tea, soda, or energy drinks). Alcoholic beverages. Fruit juices with pulp. Prune juice. Soft drinks sweetened with high-fructose corn syrup or sugar alcohols. High-calorie sports drinks. Fats and oils Butter. Cream sauces. Margarine. Salad oils. Plain salad dressings. Olives. Avocados. Mayonnaise. Sweets and desserts Sweet rolls, doughnuts, and sweet breads. Sugar-free desserts sweetened with sugar alcohols such as xylitol and sorbitol. Seasoning and other foods Honey. Hot sauce. Chili powder. Gravy. Cream-based or milk-based soups. Pancakes and waffles. Summary  When you have diarrhea, the foods you eat and your eating habits are very important.  Make sure you get at least 8-10 cups of fluid each day, or enough to keep your urine clear or pale yellow.  Eat bland foods and gradually reintroduce healthy, nutrient-rich foods as tolerated, or as told by your health care provider.  Avoid high-fiber, fried, greasy, or spicy foods. °This information is not intended to replace advice given to you by your health care provider. Make sure you discuss any questions  you have with your health care provider. °Document Revised: 09/07/2018 Document Reviewed: 05/14/2016 °Elsevier Patient Education © 2020 [employer] Inc. ° °

## 2020-02-10 ENCOUNTER — Encounter (HOSPITAL_COMMUNITY)
Admission: AD | Disposition: A | Discharge status: Home / Self Care | Payer: Self-pay | Source: Home / Self Care | Attending: Obstetrics & Gynecology

## 2020-02-10 ENCOUNTER — Inpatient Hospital Stay (HOSPITAL_COMMUNITY): Payer: Medicaid Other | Admitting: Anesthesiology

## 2020-02-10 ENCOUNTER — Encounter (HOSPITAL_COMMUNITY): Payer: Self-pay | Admitting: Emergency Medicine

## 2020-02-10 ENCOUNTER — Inpatient Hospital Stay (HOSPITAL_COMMUNITY)
Admission: AD | Admit: 2020-02-10 | Discharge: 2020-02-13 | DRG: 787 | Disposition: A | Discharge status: Home / Self Care | Payer: Medicaid Other | Attending: Obstetrics & Gynecology | Admitting: Obstetrics & Gynecology

## 2020-02-10 DIAGNOSIS — M25551 Pain in right hip: Secondary | ICD-10-CM | POA: Diagnosis not present

## 2020-02-10 DIAGNOSIS — R262 Difficulty in walking, not elsewhere classified: Secondary | ICD-10-CM | POA: Diagnosis not present

## 2020-02-10 DIAGNOSIS — R197 Diarrhea, unspecified: Secondary | ICD-10-CM | POA: Diagnosis not present

## 2020-02-10 DIAGNOSIS — O99893 Other specified diseases and conditions complicating puerperium: Secondary | ICD-10-CM | POA: Diagnosis not present

## 2020-02-10 DIAGNOSIS — O09899 Supervision of other high risk pregnancies, unspecified trimester: Secondary | ICD-10-CM

## 2020-02-10 DIAGNOSIS — Z30430 Encounter for insertion of intrauterine contraceptive device: Secondary | ICD-10-CM

## 2020-02-10 DIAGNOSIS — M25552 Pain in left hip: Secondary | ICD-10-CM | POA: Diagnosis not present

## 2020-02-10 DIAGNOSIS — O479 False labor, unspecified: Secondary | ICD-10-CM

## 2020-02-10 DIAGNOSIS — D573 Sickle-cell trait: Secondary | ICD-10-CM | POA: Diagnosis not present

## 2020-02-10 DIAGNOSIS — O329XX0 Maternal care for malpresentation of fetus, unspecified, not applicable or unspecified: Secondary | ICD-10-CM | POA: Diagnosis present

## 2020-02-10 DIAGNOSIS — O368130 Decreased fetal movements, third trimester, not applicable or unspecified: Secondary | ICD-10-CM | POA: Diagnosis not present

## 2020-02-10 DIAGNOSIS — Z3A37 37 weeks gestation of pregnancy: Secondary | ICD-10-CM | POA: Diagnosis not present

## 2020-02-10 DIAGNOSIS — O9902 Anemia complicating childbirth: Secondary | ICD-10-CM | POA: Diagnosis not present

## 2020-02-10 DIAGNOSIS — O321XX0 Maternal care for breech presentation, not applicable or unspecified: Secondary | ICD-10-CM | POA: Diagnosis not present

## 2020-02-10 DIAGNOSIS — O26893 Other specified pregnancy related conditions, third trimester: Secondary | ICD-10-CM | POA: Diagnosis not present

## 2020-02-10 DIAGNOSIS — O322XX0 Maternal care for transverse and oblique lie, not applicable or unspecified: Principal | ICD-10-CM | POA: Diagnosis present

## 2020-02-10 DIAGNOSIS — Z20822 Contact with and (suspected) exposure to covid-19: Secondary | ICD-10-CM | POA: Diagnosis not present

## 2020-02-10 DIAGNOSIS — O99891 Other specified diseases and conditions complicating pregnancy: Secondary | ICD-10-CM | POA: Diagnosis not present

## 2020-02-10 DIAGNOSIS — O0990 Supervision of high risk pregnancy, unspecified, unspecified trimester: Secondary | ICD-10-CM

## 2020-02-10 LAB — CBC
HCT: 40.4 % (ref 36.0–46.0)
HCT: 30.1 % — ABNORMAL LOW (ref 36.0–46.0)
HCT: 33.6 % — ABNORMAL LOW (ref 36.0–46.0)
HCT: 24.9 % — ABNORMAL LOW (ref 36.0–46.0)
Hemoglobin: 13.6 g/dL (ref 12.0–15.0)
Hemoglobin: 10 g/dL — ABNORMAL LOW (ref 12.0–15.0)
Hemoglobin: 11 g/dL — ABNORMAL LOW (ref 12.0–15.0)
Hemoglobin: 8.3 g/dL — ABNORMAL LOW (ref 12.0–15.0)
MCH: 30.2 pg (ref 26.0–34.0)
MCH: 30.7 pg (ref 26.0–34.0)
MCH: 30.3 pg (ref 26.0–34.0)
MCH: 30.5 pg (ref 26.0–34.0)
MCHC: 33.7 g/dL (ref 30.0–36.0)
MCHC: 33.2 g/dL (ref 30.0–36.0)
MCHC: 32.7 g/dL (ref 30.0–36.0)
MCHC: 33.3 g/dL (ref 30.0–36.0)
MCV: 89.6 fL (ref 80.0–100.0)
MCV: 92.3 fL (ref 80.0–100.0)
MCV: 92.6 fL (ref 80.0–100.0)
MCV: 91.5 fL (ref 80.0–100.0)
Platelets: 211 10*3/uL (ref 150–400)
Platelets: 158 10*3/uL (ref 150–400)
Platelets: 168 10*3/uL (ref 150–400)
Platelets: 154 10*3/uL (ref 150–400)
RBC: 4.51 MIL/uL (ref 3.87–5.11)
RBC: 3.26 MIL/uL — ABNORMAL LOW (ref 3.87–5.11)
RBC: 3.63 MIL/uL — ABNORMAL LOW (ref 3.87–5.11)
RBC: 2.72 MIL/uL — ABNORMAL LOW (ref 3.87–5.11)
RDW: 13.2 % (ref 11.5–15.5)
RDW: 13.3 % (ref 11.5–15.5)
RDW: 13.2 % (ref 11.5–15.5)
RDW: 13.3 % (ref 11.5–15.5)
WBC: 8 10*3/uL (ref 4.0–10.5)
WBC: 9.7 10*3/uL (ref 4.0–10.5)
WBC: 11.8 10*3/uL — ABNORMAL HIGH (ref 4.0–10.5)
WBC: 10.4 10*3/uL (ref 4.0–10.5)
nRBC: 0 % (ref 0.0–0.2)
nRBC: 0 % (ref 0.0–0.2)
nRBC: 0 % (ref 0.0–0.2)
nRBC: 0 % (ref 0.0–0.2)

## 2020-02-10 LAB — DIC (DISSEMINATED INTRAVASCULAR COAGULATION)PANEL
D-Dimer, Quant: 18.12 ug/mL-FEU — ABNORMAL HIGH (ref 0.00–0.50)
Fibrinogen: 429 mg/dL (ref 210–475)
INR: 1.2 (ref 0.8–1.2)
Platelets: 162 10*3/uL (ref 150–400)
Prothrombin Time: 14.3 seconds (ref 11.4–15.2)
Smear Review: NONE SEEN
aPTT: 34 seconds (ref 24–36)

## 2020-02-10 LAB — FIBRINOGEN: Fibrinogen: 496 mg/dL — ABNORMAL HIGH (ref 210–475)

## 2020-02-10 LAB — APTT: aPTT: 29 seconds (ref 24–36)

## 2020-02-10 LAB — SARS CORONAVIRUS 2 BY RT PCR (HOSPITAL ORDER, PERFORMED IN ~~LOC~~ HOSPITAL LAB): SARS Coronavirus 2: NEGATIVE

## 2020-02-10 LAB — RPR: RPR Ser Ql: NONREACTIVE

## 2020-02-10 LAB — PREPARE RBC (CROSSMATCH)

## 2020-02-10 LAB — PROTIME-INR
INR: 1.1 (ref 0.8–1.2)
Prothrombin Time: 13.7 seconds (ref 11.4–15.2)

## 2020-02-10 SURGERY — Surgical Case
Anesthesia: Spinal

## 2020-02-10 MED ORDER — TETANUS-DIPHTH-ACELL PERTUSSIS 5-2.5-18.5 LF-MCG/0.5 IM SUSP: 0.5000 mL | Freq: Once | INTRAMUSCULAR | Status: DC

## 2020-02-10 MED ORDER — LACTATED RINGERS IV SOLN: INTRAVENOUS | Status: DC

## 2020-02-10 MED ORDER — DIPHENHYDRAMINE HCL 50 MG/ML IJ SOLN: 12.5000 mg | INTRAMUSCULAR | Status: DC | PRN

## 2020-02-10 MED ORDER — MAGNESIUM HYDROXIDE 400 MG/5ML PO SUSP: 30.0000 mL | ORAL | Status: DC | PRN

## 2020-02-10 MED ORDER — ONDANSETRON HCL 4 MG/2ML IJ SOLN
INTRAMUSCULAR | Status: AC
  Filled 2020-02-10: qty 2

## 2020-02-10 MED ORDER — LEVONORGESTREL 19.5 MCG/DAY IU IUD
INTRAUTERINE_SYSTEM | INTRAUTERINE | Status: AC
  Administered 2020-02-10: 1 via INTRAUTERINE

## 2020-02-10 MED ORDER — CHLORHEXIDINE GLUCONATE 0.12 % MT SOLN
15.0000 mL | Freq: Once | OROMUCOSAL | Status: DC
  Filled 2020-02-10: qty 15

## 2020-02-10 MED ORDER — NALBUPHINE HCL 10 MG/ML IJ SOLN: 5.0000 mg | Freq: Once | INTRAMUSCULAR | Status: DC | PRN

## 2020-02-10 MED ORDER — FENTANYL CITRATE (PF) 100 MCG/2ML IJ SOLN
INTRAMUSCULAR | Status: AC
  Filled 2020-02-10: qty 2

## 2020-02-10 MED ORDER — BUPIVACAINE IN DEXTROSE 0.75-8.25 % IT SOLN
INTRATHECAL | Status: DC | PRN
  Administered 2020-02-10: 1.6 mL via INTRATHECAL

## 2020-02-10 MED ORDER — HYDROMORPHONE HCL 1 MG/ML IJ SOLN
1.0000 mg | INTRAMUSCULAR | Status: DC | PRN
  Administered 2020-02-10: 1 mg via INTRAVENOUS
  Filled 2020-02-10: qty 2

## 2020-02-10 MED ORDER — DIBUCAINE (PERIANAL) 1 % EX OINT: 1.0000 "application " | TOPICAL_OINTMENT | CUTANEOUS | Status: DC | PRN

## 2020-02-10 MED ORDER — OXYCODONE HCL 5 MG PO TABS
5.0000 mg | ORAL_TABLET | ORAL | Status: DC | PRN
  Administered 2020-02-13: 5 mg via ORAL
  Filled 2020-02-10: qty 1

## 2020-02-10 MED ORDER — OXYCODONE HCL 5 MG PO TABS: 5.0000 mg | ORAL_TABLET | Freq: Once | ORAL | Status: DC | PRN

## 2020-02-10 MED ORDER — KETOROLAC TROMETHAMINE 30 MG/ML IJ SOLN
30.0000 mg | Freq: Four times a day (QID) | INTRAMUSCULAR | Status: AC | PRN
  Administered 2020-02-10 (×2): 30 mg via INTRAVENOUS
  Filled 2020-02-10 (×2): qty 1

## 2020-02-10 MED ORDER — MORPHINE SULFATE (PF) 0.5 MG/ML IJ SOLN
INTRAMUSCULAR | Status: DC | PRN
Start: 2020-02-10 — End: 2020-02-10
  Administered 2020-02-10: .15 mg via INTRATHECAL

## 2020-02-10 MED ORDER — NALOXONE HCL 4 MG/10ML IJ SOLN
1.0000 ug/kg/h | INTRAVENOUS | Status: DC | PRN
  Filled 2020-02-10: qty 5

## 2020-02-10 MED ORDER — OXYTOCIN-SODIUM CHLORIDE 30-0.9 UT/500ML-% IV SOLN
INTRAVENOUS | Status: AC
  Filled 2020-02-10: qty 500

## 2020-02-10 MED ORDER — SENNOSIDES-DOCUSATE SODIUM 8.6-50 MG PO TABS
2.0000 | ORAL_TABLET | ORAL | Status: DC
  Administered 2020-02-11 (×2): 2 via ORAL
  Filled 2020-02-10 (×2): qty 2

## 2020-02-10 MED ORDER — LEVONORGESTREL 19.5 MCG/DAY IU IUD
INTRAUTERINE_SYSTEM | INTRAUTERINE | Status: AC
  Filled 2020-02-10: qty 1

## 2020-02-10 MED ORDER — LACTATED RINGERS IV SOLN: INTRAVENOUS | Status: DC | PRN

## 2020-02-10 MED ORDER — KETOROLAC TROMETHAMINE 30 MG/ML IJ SOLN
INTRAMUSCULAR | Status: AC
  Filled 2020-02-10: qty 1

## 2020-02-10 MED ORDER — ACETAMINOPHEN 10 MG/ML IV SOLN
1000.0000 mg | Freq: Once | INTRAVENOUS | Status: AC
  Administered 2020-02-10: 1000 mg via INTRAVENOUS

## 2020-02-10 MED ORDER — MEPERIDINE HCL 25 MG/ML IJ SOLN: 6.2500 mg | INTRAMUSCULAR | Status: DC | PRN

## 2020-02-10 MED ORDER — OXYTOCIN-SODIUM CHLORIDE 30-0.9 UT/500ML-% IV SOLN
INTRAVENOUS | Status: DC | PRN
  Administered 2020-02-10: 400 mL via INTRAVENOUS

## 2020-02-10 MED ORDER — NALOXONE HCL 0.4 MG/ML IJ SOLN: 0.4000 mg | INTRAMUSCULAR | Status: DC | PRN

## 2020-02-10 MED ORDER — SODIUM CHLORIDE 0.9 % IV SOLN
INTRAVENOUS | Status: AC
  Filled 2020-02-10: qty 2

## 2020-02-10 MED ORDER — PHENYLEPHRINE HCL-NACL 20-0.9 MG/250ML-% IV SOLN
INTRAVENOUS | Status: DC | PRN
  Administered 2020-02-10: 60 ug/min via INTRAVENOUS

## 2020-02-10 MED ORDER — CEFAZOLIN SODIUM-DEXTROSE 2-4 GM/100ML-% IV SOLN: 2.0000 g | INTRAVENOUS | Status: DC

## 2020-02-10 MED ORDER — SOD CITRATE-CITRIC ACID 500-334 MG/5ML PO SOLN
30.0000 mL | Freq: Once | ORAL | Status: AC
  Administered 2020-02-11: 30 mL via ORAL
  Filled 2020-02-10: qty 30

## 2020-02-10 MED ORDER — NALBUPHINE HCL 10 MG/ML IJ SOLN: 5.0000 mg | INTRAMUSCULAR | Status: DC | PRN

## 2020-02-10 MED ORDER — TRANEXAMIC ACID-NACL 1000-0.7 MG/100ML-% IV SOLN
1000.0000 mg | INTRAVENOUS | Status: AC
  Administered 2020-02-10: 1000 mg via INTRAVENOUS

## 2020-02-10 MED ORDER — OXYCODONE-ACETAMINOPHEN 5-325 MG PO TABS
2.0000 | ORAL_TABLET | ORAL | Status: DC | PRN
  Administered 2020-02-11: 2 via ORAL
  Filled 2020-02-10: qty 2

## 2020-02-10 MED ORDER — PHENYLEPHRINE HCL-NACL 20-0.9 MG/250ML-% IV SOLN
INTRAVENOUS | Status: AC
  Filled 2020-02-10: qty 250

## 2020-02-10 MED ORDER — ONDANSETRON HCL 4 MG/2ML IJ SOLN
4.0000 mg | Freq: Three times a day (TID) | INTRAMUSCULAR | Status: DC | PRN
  Administered 2020-02-10: 4 mg via INTRAVENOUS
  Filled 2020-02-10: qty 2

## 2020-02-10 MED ORDER — ONDANSETRON HCL 4 MG/2ML IJ SOLN
INTRAMUSCULAR | Status: DC | PRN
  Administered 2020-02-10: 4 mg via INTRAVENOUS

## 2020-02-10 MED ORDER — WITCH HAZEL-GLYCERIN EX PADS: 1.0000 "application " | MEDICATED_PAD | CUTANEOUS | Status: DC | PRN

## 2020-02-10 MED ORDER — ACETAMINOPHEN 10 MG/ML IV SOLN
INTRAVENOUS | Status: AC
  Filled 2020-02-10: qty 100

## 2020-02-10 MED ORDER — KETOROLAC TROMETHAMINE 30 MG/ML IJ SOLN: 30.0000 mg | Freq: Four times a day (QID) | INTRAMUSCULAR | Status: AC | PRN

## 2020-02-10 MED ORDER — SOD CITRATE-CITRIC ACID 500-334 MG/5ML PO SOLN
30.0000 mL | Freq: Once | ORAL | Status: AC
  Administered 2020-02-10: 30 mL via ORAL
  Filled 2020-02-10: qty 30

## 2020-02-10 MED ORDER — FENTANYL CITRATE (PF) 100 MCG/2ML IJ SOLN
INTRAMUSCULAR | Status: DC | PRN
  Administered 2020-02-10: 35 ug via INTRAVENOUS
  Administered 2020-02-10: 50 ug via INTRAVENOUS

## 2020-02-10 MED ORDER — DIPHENHYDRAMINE HCL 25 MG PO CAPS: 25.0000 mg | ORAL_CAPSULE | Freq: Four times a day (QID) | ORAL | Status: DC | PRN

## 2020-02-10 MED ORDER — KETOROLAC TROMETHAMINE 30 MG/ML IJ SOLN
30.0000 mg | Freq: Once | INTRAMUSCULAR | Status: AC
  Administered 2020-02-10: 30 mg via INTRAVENOUS

## 2020-02-10 MED ORDER — PROMETHAZINE HCL 25 MG/ML IJ SOLN: 6.2500 mg | INTRAMUSCULAR | Status: DC | PRN

## 2020-02-10 MED ORDER — FAMOTIDINE 20 MG PO TABS
20.0000 mg | ORAL_TABLET | Freq: Once | ORAL | Status: DC
  Filled 2020-02-10: qty 1

## 2020-02-10 MED ORDER — ORAL CARE MOUTH RINSE: 15.0000 mL | Freq: Once | OROMUCOSAL | Status: DC

## 2020-02-10 MED ORDER — PRENATAL MULTIVITAMIN CH
1.0000 | ORAL_TABLET | Freq: Every day | ORAL | Status: DC
  Administered 2020-02-11 – 2020-02-13 (×3): 1 via ORAL
  Filled 2020-02-10 (×3): qty 1

## 2020-02-10 MED ORDER — FENTANYL CITRATE (PF) 100 MCG/2ML IJ SOLN
INTRAMUSCULAR | Status: DC | PRN
Start: 2020-02-10 — End: 2020-02-10
  Administered 2020-02-10: 15 ug via INTRATHECAL

## 2020-02-10 MED ORDER — OXYCODONE HCL 5 MG/5ML PO SOLN: 5.0000 mg | Freq: Once | ORAL | Status: DC | PRN

## 2020-02-10 MED ORDER — TRANEXAMIC ACID-NACL 1000-0.7 MG/100ML-% IV SOLN
INTRAVENOUS | Status: AC
  Filled 2020-02-10: qty 100

## 2020-02-10 MED ORDER — ZOLPIDEM TARTRATE 5 MG PO TABS: 5.0000 mg | ORAL_TABLET | Freq: Every evening | ORAL | Status: DC | PRN

## 2020-02-10 MED ORDER — COCONUT OIL OIL: 1.0000 "application " | TOPICAL_OIL | Status: DC | PRN

## 2020-02-10 MED ORDER — FERROUS SULFATE 325 (65 FE) MG PO TABS
325.0000 mg | ORAL_TABLET | Freq: Two times a day (BID) | ORAL | Status: DC
  Administered 2020-02-11 – 2020-02-13 (×5): 325 mg via ORAL
  Filled 2020-02-10 (×5): qty 1

## 2020-02-10 MED ORDER — MENTHOL 3 MG MT LOZG: 1.0000 | LOZENGE | OROMUCOSAL | Status: DC | PRN

## 2020-02-10 MED ORDER — FENTANYL CITRATE (PF) 100 MCG/2ML IJ SOLN
25.0000 ug | INTRAMUSCULAR | Status: DC | PRN
  Administered 2020-02-10 (×2): 50 ug via INTRAVENOUS

## 2020-02-10 MED ORDER — SIMETHICONE 80 MG PO CHEW
80.0000 mg | CHEWABLE_TABLET | ORAL | Status: DC | PRN
  Filled 2020-02-10: qty 1

## 2020-02-10 MED ORDER — DIPHENHYDRAMINE HCL 25 MG PO CAPS: 25.0000 mg | ORAL_CAPSULE | ORAL | Status: DC | PRN

## 2020-02-10 MED ORDER — SCOPOLAMINE 1 MG/3DAYS TD PT72
1.0000 | MEDICATED_PATCH | Freq: Once | TRANSDERMAL | Status: DC
  Filled 2020-02-10: qty 1

## 2020-02-10 MED ORDER — STERILE WATER FOR IRRIGATION IR SOLN
Status: DC | PRN
  Administered 2020-02-10: 1

## 2020-02-10 MED ORDER — ENOXAPARIN SODIUM 40 MG/0.4ML ~~LOC~~ SOLN
40.0000 mg | SUBCUTANEOUS | Status: DC
  Administered 2020-02-10 – 2020-02-12 (×3): 40 mg via SUBCUTANEOUS
  Filled 2020-02-10 (×3): qty 0.4

## 2020-02-10 MED ORDER — PHENYLEPHRINE 40 MCG/ML (10ML) SYRINGE FOR IV PUSH (FOR BLOOD PRESSURE SUPPORT)
PREFILLED_SYRINGE | INTRAVENOUS | Status: AC
  Filled 2020-02-10: qty 10

## 2020-02-10 MED ORDER — FAMOTIDINE IN NACL 20-0.9 MG/50ML-% IV SOLN
20.0000 mg | Freq: Once | INTRAVENOUS | Status: AC
  Administered 2020-02-10: 20 mg via INTRAVENOUS
  Filled 2020-02-10: qty 50

## 2020-02-10 MED ORDER — OXYTOCIN-SODIUM CHLORIDE 30-0.9 UT/500ML-% IV SOLN: 2.5000 [IU]/h | INTRAVENOUS | Status: AC

## 2020-02-10 MED ORDER — PHENYLEPHRINE HCL (PRESSORS) 10 MG/ML IV SOLN
INTRAVENOUS | Status: DC | PRN
  Administered 2020-02-10 (×2): 80 ug via INTRAVENOUS
  Administered 2020-02-10: 120 ug via INTRAVENOUS

## 2020-02-10 MED ORDER — SIMETHICONE 80 MG PO CHEW
80.0000 mg | CHEWABLE_TABLET | ORAL | Status: DC
  Administered 2020-02-11 (×2): 80 mg via ORAL
  Filled 2020-02-10 (×3): qty 1

## 2020-02-10 MED ORDER — ALBUMIN HUMAN 5 % IV SOLN: INTRAVENOUS | Status: DC | PRN

## 2020-02-10 MED ORDER — SCOPOLAMINE 1 MG/3DAYS TD PT72
1.0000 | MEDICATED_PATCH | Freq: Once | TRANSDERMAL | Status: AC
  Administered 2020-02-10: 1.5 mg via TRANSDERMAL

## 2020-02-10 MED ORDER — SODIUM CHLORIDE 0.9% FLUSH: 3.0000 mL | INTRAVENOUS | Status: DC | PRN

## 2020-02-10 MED ORDER — ACETAMINOPHEN 500 MG PO TABS
1000.0000 mg | ORAL_TABLET | Freq: Four times a day (QID) | ORAL | Status: DC
  Administered 2020-02-10 – 2020-02-13 (×10): 1000 mg via ORAL
  Filled 2020-02-10 (×12): qty 2

## 2020-02-10 MED ORDER — SODIUM CHLORIDE 0.9 % IV SOLN
2.0000 g | INTRAVENOUS | Status: AC
  Administered 2020-02-10: 2 g via INTRAVENOUS
  Filled 2020-02-10: qty 2

## 2020-02-10 MED ORDER — ALBUMIN HUMAN 5 % IV SOLN
INTRAVENOUS | Status: AC
  Filled 2020-02-10: qty 250

## 2020-02-10 MED ORDER — MORPHINE SULFATE (PF) 0.5 MG/ML IJ SOLN
INTRAMUSCULAR | Status: AC
  Filled 2020-02-10: qty 10

## 2020-02-10 MED ORDER — GABAPENTIN 100 MG PO CAPS
300.0000 mg | ORAL_CAPSULE | Freq: Two times a day (BID) | ORAL | Status: DC
  Administered 2020-02-10 – 2020-02-11 (×3): 300 mg via ORAL
  Filled 2020-02-10 (×3): qty 3

## 2020-02-10 MED ORDER — MEASLES, MUMPS & RUBELLA VAC IJ SOLR: 0.5000 mL | Freq: Once | INTRAMUSCULAR | Status: DC

## 2020-02-10 SURGICAL SUPPLY — 34 items
CHLORAPREP W/TINT 26ML (MISCELLANEOUS) ×2 IMPLANT
CLAMP CORD UMBIL (MISCELLANEOUS) IMPLANT
CLOTH BEACON ORANGE TIMEOUT ST (SAFETY) ×2 IMPLANT
DRSG OPSITE POSTOP 4X10 (GAUZE/BANDAGES/DRESSINGS) ×2 IMPLANT
ELECT REM PT RETURN 9FT ADLT (ELECTROSURGICAL) ×2
ELECTRODE REM PT RTRN 9FT ADLT (ELECTROSURGICAL) ×1 IMPLANT
EXTRACTOR VACUUM M CUP 4 TUBE (SUCTIONS) IMPLANT
GLOVE BIOGEL PI IND STRL 7.0 (GLOVE) ×3 IMPLANT
GLOVE BIOGEL PI INDICATOR 7.0 (GLOVE) ×3
GLOVE ECLIPSE 7.0 STRL STRAW (GLOVE) ×2 IMPLANT
GOWN STRL REUS W/TWL LRG LVL3 (GOWN DISPOSABLE) ×4 IMPLANT
HEMOSTAT SURGICEL 4X8 (HEMOSTASIS) ×2 IMPLANT
KIT ABG SYR 3ML LUER SLIP (SYRINGE) IMPLANT
NEEDLE HYPO 22GX1.5 SAFETY (NEEDLE) ×2 IMPLANT
NEEDLE HYPO 25X5/8 SAFETYGLIDE (NEEDLE) ×2 IMPLANT
NS IRRIG 1000ML POUR BTL (IV SOLUTION) ×2 IMPLANT
PACK C SECTION WH (CUSTOM PROCEDURE TRAY) ×2 IMPLANT
PAD ABD 7.5X8 STRL (GAUZE/BANDAGES/DRESSINGS) ×2 IMPLANT
PAD OB MATERNITY 4.3X12.25 (PERSONAL CARE ITEMS) ×2 IMPLANT
PENCIL SMOKE EVAC W/HOLSTER (ELECTROSURGICAL) ×2 IMPLANT
RTRCTR C-SECT PINK 25CM LRG (MISCELLANEOUS) IMPLANT
SPONGE GAUZE 4X4 12PLY STER LF (GAUZE/BANDAGES/DRESSINGS) ×4 IMPLANT
SPONGE LAP 18X18 RF (DISPOSABLE) ×2 IMPLANT
SUT MNCRL 0 VIOLET CTX 36 (SUTURE) ×2 IMPLANT
SUT MONOCRYL 0 CTX 36 (SUTURE) ×2
SUT PDS AB 0 CTX 36 PDP370T (SUTURE) IMPLANT
SUT PLAIN 2 0 XLH (SUTURE) IMPLANT
SUT VIC AB 0 CTX 36 (SUTURE) ×2
SUT VIC AB 0 CTX36XBRD ANBCTRL (SUTURE) ×2 IMPLANT
SUT VIC AB 4-0 KS 27 (SUTURE) ×2 IMPLANT
SYR CONTROL 10ML LL (SYRINGE) ×2 IMPLANT
TOWEL OR 17X24 6PK STRL BLUE (TOWEL DISPOSABLE) ×2 IMPLANT
TRAY FOLEY W/BAG SLVR 14FR LF (SET/KITS/TRAYS/PACK) ×2 IMPLANT
WATER STERILE IRR 1000ML POUR (IV SOLUTION) ×2 IMPLANT

## 2020-02-10 NOTE — MAU Provider Note (Addendum)
[id number] Shirley Baker [birth year]  Patient informed that the ultrasound is considered a limited OB ultrasound and is not intended to be a complete ultrasound exam.  Patient also informed that the ultrasound is not being completed with the intent of assessing for fetal or placental anomalies or any pelvic abnormalities.  Explained that the purpose of todays ultrasound is to assess for  presentation.  Patient acknowledges the purpose of the exam and the limitations of the study.  Fetal presentation transverse oblique with cephalic on maternal right.   Cherre Robins, CNM 02/10/2020, 3:05 AM

## 2020-02-10 NOTE — Anesthesia Postprocedure Evaluation (Signed)
Anesthesia Post Note  Patient: Armed forces training and education officer  Procedure(s) Performed: CESAREAN SECTION (N/A )     Patient location during evaluation: PACU Anesthesia Type: Spinal Level of consciousness: awake and alert Pain management: pain level controlled Vital Signs Assessment: post-procedure vital signs reviewed and stable Respiratory status: spontaneous breathing and respiratory function stable Cardiovascular status: blood pressure returned to baseline and stable Postop Assessment: spinal receding and no apparent nausea or vomiting Anesthetic complications: no   No complications documented.  Last Vitals:  Vitals:   02/10/20 1355 02/10/20 1610  BP: 102/66   Pulse: (!) 59   Resp: 17 18  Temp: 36.4 C 36.8 C  SpO2:      Last Pain:  Vitals:   02/10/20 1820  TempSrc:   PainSc: 0-No pain   Pain Goal: Patients Stated Pain Goal: 0 (02/10/20 1355)                 Beryle Lathe

## 2020-02-10 NOTE — Progress Notes (Signed)
I called Dr L. Bass to review current lab results,CBC, fibrinogen, PT PTT, VS, urine output, no ^ drainage at [facility] dressing since 1222 today. Dr Donavan Foil states pt may eat solid food and can stand at beside for orthostatic VS.

## 2020-02-10 NOTE — H&P (Addendum)
OBSTETRIC ADMISSION HISTORY AND PHYSICAL  Shirley Baker is a 29 y.o. female [id number] with IUP at [redacted]w[redacted]d by LMP presenting for contractions every 2-3 minutes. She reports +FMs, No LOF, no VB, no blurry vision, headaches or peripheral edema, and RUQ pain.  She plans on bottle feeding. She request PP Liletta for birth control. She received her prenatal care at [facility]   Dating: By LMP --->  Estimated Date of Delivery: 02/28/20  Sono:    12/20/19@[redacted]w[redacted]d , CWD, normal anatomy, cephalic presentation, 1564g, 69% EFW   Prenatal History/Complications:  Short interval pregnancy (delivered 09/2018) Sickle cell trait (partner with sickle cell trait as well, prior child with SS disease) Transverse oblique presentation of fetus   Past Medical History: Past Medical History:  Diagnosis Date   Anemia    Heart murmur    Ovarian cyst    Syncope     Past Surgical History: Past Surgical History:  Procedure Laterality Date   NO PAST SURGERIES      Obstetrical History: OB History     Gravida  4   Para  3   Term  3   Preterm      AB      Living  3      SAB      TAB      Ectopic      Multiple  0   Live Births  3        Obstetric Comments  LBW         Social History Social History   Socioeconomic History   Marital status: Single    Spouse name: Not on file   Number of children: 2   Years of education: Not on file   Highest education level: Associate degree: occupational, Scientist, product/process development, or vocational program  Occupational History   Not on file  Tobacco Use   Smoking status: Never Smoker   Smokeless tobacco: Never Used   Tobacco comment: hookah, last Aug 2019- patient denies 08/16/2019  Vaping Use   Vaping Use: Never used  Substance and Sexual Activity   Alcohol use: No   Drug use: No   Sexual activity: Yes    Birth control/protection: None  Other Topics Concern   Not on file  Social History Narrative   Not on file   Social Determinants of Health   Financial  Resource Strain:    Difficulty of Paying Living Expenses: Not on file  Food Insecurity: No Food Insecurity   Worried About Running Out of Food in the Last Year: Never true   Ran Out of Food in the Last Year: Never true  Transportation Needs: No Transportation Needs   Lack of Transportation (Medical): No   Lack of Transportation (Non-Medical): No  Physical Activity:    Days of Exercise per Week: Not on file   Minutes of Exercise per Session: Not on file  Stress:    Feeling of Stress : Not on file  Social Connections:    Frequency of Communication with Friends and Family: Not on file   Frequency of Social Gatherings with Friends and Family: Not on file   Attends Religious Services: Not on file   Active Member of Clubs or Organizations: Not on file   Attends Banker Meetings: Not on file   Marital Status: Not on file    Family History: Family History  Problem Relation Age of Onset   Healthy Mother    Healthy Father    Hearing loss Neg Hx  Allergies: No Known Allergies  Medications Prior to Admission  Medication Sig Dispense Refill Last Dose   acetaminophen (TYLENOL) 500 MG tablet Take 500 mg by mouth every 6 (six) hours as needed.      cyclobenzaprine (FLEXERIL) 10 MG tablet Take 1 tablet (10 mg total) by mouth 2 (two) times daily as needed for muscle spasms. (Patient not taking: Reported on 01/29/2020) 30 tablet 1    Doxylamine-Pyridoxine (DICLEGIS) 10-10 MG TBEC Take 2 tablets at bedtime and one in the morning and one in the afternoon as needed for nausea. 60 tablet 2    Elastic Bandages & Supports (COMFORT FIT MATERNITY SUPP MED) MISC 1 Device by Does not apply route daily. (Patient not taking: Reported on 11/02/2019) 1 each 0    Prenat w/o A Vit-FeFum-FePo-FA (CONCEPT OB) 130-92.4-1 MG CAPS TAKE ONE CAPSULE BY MOUTH DAILY 30 capsule 11      Review of Systems   All systems reviewed and negative except as stated in HPI  Blood pressure 140/70, pulse 78,  temperature 98.2 F (36.8 C), temperature source Oral, resp. rate 18, height 5\' 1"  (1.549 m), weight 68.4 kg, last menstrual period 05/24/2019, SpO2 100 %, not currently breastfeeding. General appearance: alert, cooperative and moderate distress Lungs: normal respiratory effort Heart: regular rate and rhythm Abdomen: soft, non-tender, gravid Pelvic: as noted below Extremities: Homans sign is negative, no sign of DVT Presentation:  transverse oblique on BSUS Fetal monitoringBaseline: 130 bpm, Variability: Good {> 6 bpm), Accelerations: Reactive and Decelerations: Late, intermittent, with recovery to baseline Uterine activityFrequency: Every 1-3 minutes Dilation: 3 Effacement (%): 100 Exam by::  (SVE by [facility].  [facility] by [place], CNM)   Prenatal labs: ABO, Rh: B/Positive/-- (03/25 1203) Antibody: Negative (03/25 1203) Rubella: 8.22 (03/25 1203) RPR: Non Reactive (07/02 0909)  HBsAg: Negative (03/25 1203)  HIV: Non Reactive (07/02 0909)  GBS:   pending 2 hr Glucola passed Genetic screening  normal Anatomy 08-30-1986 incomplete, follow up unremarkable  Prenatal Transfer Tool  Maternal Diabetes: No Genetic Screening: Normal Maternal Ultrasounds/Referrals: Normal Fetal Ultrasounds or other Referrals:  None Maternal Substance Abuse:  No Significant Maternal Medications:  None Significant Maternal Lab Results: None  Results for orders placed or performed during the hospital encounter of 02/09/20 (from the past 24 hour(s))  Urinalysis, Routine w reflex microscopic Urine, Clean Catch   Collection Time: 02/09/20  9:33 AM  Result Value Ref Range   Color, Urine YELLOW YELLOW   APPearance CLEAR CLEAR   Specific Gravity, Urine 1.008 1.005 - 1.030   pH 6.0 5.0 - 8.0   Glucose, UA NEGATIVE NEGATIVE mg/dL   Hgb urine dipstick NEGATIVE NEGATIVE   Bilirubin Urine NEGATIVE NEGATIVE   Ketones, ur 80 (A) NEGATIVE mg/dL   Protein, ur NEGATIVE NEGATIVE mg/dL   Nitrite NEGATIVE NEGATIVE   Leukocytes,Ua  NEGATIVE NEGATIVE    Patient Active Problem List   Diagnosis Date Noted   Labor and delivery affected by malposition and malpresentation 02/10/2020   Supervision of high risk pregnancy, antepartum 08/16/2019   Short interval between pregnancies affecting pregnancy, antepartum 08/16/2019   Sickle cell trait (HCC) 04/14/2018   History of low birth weight 04/14/2018    Assessment/Plan:  Shirley Baker is a 29 y.o. 26 at [redacted]w[redacted]d here for contractions, patient found to have transverse oblique fetal lie with head to maternal upper right so she is admitted for delivery.  #C/S: Given fetal presentation discussed ECV with patient vs cesarean section with patient. Patient adamantly declines ECV  at this time.  The risks of cesarean section were discussed with the patient including but were not limited to: bleeding which may require transfusion or reoperation; infection which may require antibiotics; injury to bowel, bladder, ureters or other surrounding organs; injury to the fetus; need for additional procedures including hysterectomy in the event of a life-threatening hemorrhage; placental abnormalities wth subsequent pregnancies, incisional problems, thromboembolic phenomenon and other postoperative/anesthesia complications. Patient also desires post placental IUD at the time of delivery. Discussed risks/benefits of Paraguard IUD vs Liletta IUD. Patient desires Shirley Baker IUD at this time. The patient concurred with the proposed plan, giving informed written consent for the procedures.  Patient has been NPO since 2130 on 9/11 she will remain NPO for procedure. Anesthesia and OR aware.  Preoperative prophylactic antibiotics and SCDs ordered on call to the OR.  To OR when ready.  #Pain: PRN while in MAU, then spinal #FWB: Cat 2 #ID: Cefotetan for SCIP #MOF: bottle #MOC: PP Liletta #Circ: yes  Alric Seton, MD  02/10/2020, 3:26 AM   Attestation of Attending Supervision of Obstetric Fellow:  Evaluation and management procedures were performed by the Obstetric Fellow under my supervision and collaboration.  I have reviewed the Obstetric Fellow's note and chart, and I agree with the management and plan. I have also made any necessary editorial changes.  Of note, patient had more pain. On recheck, noted to be 5-6/BBOW.  Given active labor, will proceed with cesarean delivery now.  OR and Anesthesia teams notified. COVID screen still pending, appropriate PPE will be worn during procedure. To OR now.   Jaynie Collins, MD, FACOG Attending Obstetrician & Gynecologist, [facility] for [facility], [facility] 02/10/2020  3:45 AM

## 2020-02-10 NOTE — Anesthesia Procedure Notes (Signed)
Spinal  Patient location during procedure: OR Start time: 02/10/2020 4:09 AM End time: 02/10/2020 4:12 AM Staffing Performed: anesthesiologist  Anesthesiologist: Beryle Lathe, MD Preanesthetic Checklist Completed: patient identified, IV checked, risks and benefits discussed, surgical consent, monitors and equipment checked, pre-op evaluation and timeout performed Spinal Block Patient position: sitting Prep: DuraPrep Patient monitoring: heart rate, cardiac monitor, continuous pulse ox and blood pressure Approach: midline Location: L3-4 Injection technique: single-shot Needle Needle type: Pencan  Needle gauge: 24 G Additional Notes Consent was obtained prior to the procedure with all questions answered and concerns addressed. Risks including, but not limited to, bleeding, infection, nerve damage, paralysis, failed block, inadequate analgesia, allergic reaction, high spinal, itching, and headache were discussed and the patient wished to proceed. Functioning IV was confirmed and monitors were applied. Sterile prep and drape, including hand hygiene, mask, and sterile gloves were used. The patient was positioned and the spine was prepped. The skin was anesthetized with lidocaine. Free flow of clear CSF was obtained prior to injecting local anesthetic into the CSF. The spinal needle aspirated freely following injection. The needle was carefully withdrawn. The patient tolerated the procedure well.   Leslye Peer, MD

## 2020-02-10 NOTE — ED Triage Notes (Signed)
Patient reports she is [redacted] weeks pregnant and is having contractions that are 4 min apart. Reports leaking of fluid and vaginal bleeding. States her baby is "breech"

## 2020-02-10 NOTE — Discharge Instructions (Signed)

## 2020-02-10 NOTE — Anesthesia Preprocedure Evaluation (Addendum)
Anesthesia Evaluation  Patient identified by MRN, date of birth, ID band Patient awake    Reviewed: Allergy & Precautions, NPO status , Patient's Chart, lab work & pertinent test results  History of Anesthesia Complications Negative for: history of anesthetic complications  Airway Mallampati: II   Neck ROM: Full    Dental   Pulmonary neg pulmonary ROS,    Pulmonary exam normal        Cardiovascular negative cardio ROS Normal cardiovascular exam     Neuro/Psych negative neurological ROS  negative psych ROS   GI/Hepatic negative GI ROS, Neg liver ROS,   Endo/Other  negative endocrine ROS  Renal/GU negative Renal ROS     Musculoskeletal negative musculoskeletal ROS (+)   Abdominal   Peds  Hematology  (+) Sickle cell trait and anemia ,  Plt 211k    Anesthesia Other Findings Covid test negative  Reproductive/Obstetrics (+) Pregnancy                            Anesthesia Physical Anesthesia Plan  ASA: II and emergent  Anesthesia Plan: Spinal   Post-op Pain Management:    Induction:   PONV Risk Score and Plan: 2 and Treatment may vary due to age or medical condition, Ondansetron and Scopolamine patch - Pre-op  Airway Management Planned: Natural Airway  Additional Equipment: None  Intra-op Plan:   Post-operative Plan:   Informed Consent: I have reviewed the patients History and Physical, chart, labs and discussed the procedure including the risks, benefits and alternatives for the proposed anesthesia with the patient or authorized representative who has indicated his/her understanding and acceptance.       Plan Discussed with: CRNA and Anesthesiologist  Anesthesia Plan Comments: (Labs reviewed, platelets acceptable. Discussed risks and benefits of spinal, including spinal/epidural hematoma, infection, failed block, and PDPH. Patient expressed understanding and wished to  proceed. )        Anesthesia Quick Evaluation

## 2020-02-10 NOTE — Transfer of Care (Signed)
Immediate Anesthesia Transfer of Care Note  Patient: Shirley Baker  Procedure(s) Performed: CESAREAN SECTION (N/A )  Patient Location: PACU  Anesthesia Type:Spinal  Level of Consciousness: awake, alert  and patient cooperative  Airway & Oxygen Therapy: Patient Spontanous Breathing  Post-op Assessment: Report given to RN and Post -op Vital signs reviewed and stable  Post vital signs: Reviewed and stable  Last Vitals:  Vitals Value Taken Time  BP 98/64 02/10/20 0600  Temp    Pulse 66 02/10/20 0605  Resp 17 02/10/20 0605  SpO2 98 % 02/10/20 0605  Vitals shown include unvalidated device data.  Last Pain:  Vitals:   02/10/20 0254  TempSrc: Oral  PainSc:          Complications: No complications documented.

## 2020-02-10 NOTE — ED Provider Notes (Signed)
[facility] EMERGENCY DEPARTMENT Provider Note   CSN: [id number] Arrival date & time: 02/10/20  0215     History Chief Complaint  Patient presents with  . Contractions    Shirley Baker is a 29 y.o. female.  The history is provided by the patient and medical records.    29 y.o. G4P3 at [redacted]w[redacted]d gestation here with contractions.  She was seen at [facility] yesterday for same-- cervix was not dilated, baby remains breach.  States she requested c-section due to discomfort but they would not do it.  States she has continued to have contractions since that time, becoming more intense.  Estimates they are about 4 mins apart.  States she feels like she is starting to dilate.  She denies feeling her water breaking.  No bleeding reported.  States prior deliveries were all vaginal, this is her first likely c-section.  Past Medical History:  Diagnosis Date  . Anemia   . Heart murmur   . Ovarian cyst   . Syncope     Patient Active Problem List   Diagnosis Date Noted  . Supervision of high risk pregnancy, antepartum 08/16/2019  . Short interval between pregnancies affecting pregnancy, antepartum 08/16/2019  . Heart murmur   . Sickle cell trait (HCC) 04/14/2018  . History of low birth weight 04/14/2018  . Nausea and vomiting in pregnancy 04/14/2018    Past Surgical History:  Procedure Laterality Date  . NO PAST SURGERIES       OB History    Gravida  4   Para  3   Term  3   Preterm      AB      Living  3     SAB      TAB      Ectopic      Multiple  0   Live Births  3        Obstetric Comments  LBW        Family History  Problem Relation Age of Onset  . Healthy Mother   . Healthy Father   . Hearing loss Neg Hx     Social History   Tobacco Use  . Smoking status: Never Smoker  . Smokeless tobacco: Never Used  . Tobacco comment: hookah, last Aug 2019- patient denies 08/16/2019  Vaping Use  . Vaping Use: Never used  Substance Use Topics  .  Alcohol use: No  . Drug use: No    Home Medications Prior to Admission medications   Medication Sig Start Date End Date Taking? Authorizing Provider  acetaminophen (TYLENOL) 500 MG tablet Take 500 mg by mouth every 6 (six) hours as needed.    [provider]  cyclobenzaprine (FLEXERIL) 10 MG tablet Take 1 tablet (10 mg total) by mouth 2 (two) times daily as needed for muscle spasms. Patient not taking: Reported on 01/29/2020 12/20/19   Marny Lowenstein, PA-C  Doxylamine-Pyridoxine (DICLEGIS) 10-10 MG TBEC Take 2 tablets at bedtime and one in the morning and one in the afternoon as needed for nausea. 10/05/19   Burleson, Brand Males, NP  Elastic Bandages & Supports (COMFORT FIT MATERNITY SUPP MED) MISC 1 Device by Does not apply route daily. Patient not taking: Reported on 11/02/2019 09/09/19   Donette Larry, CNM  Prenat w/o A Vit-FeFum-FePo-FA (CONCEPT OB) 130-92.4-1 MG CAPS TAKE ONE CAPSULE BY MOUTH DAILY 05/08/19   Reva Bores, MD    Allergies    Patient has no known allergies.  Review of Systems   Review of Systems  Gastrointestinal: Positive for abdominal pain.  All other systems reviewed and are negative.   Physical Exam Updated Vital Signs Ht 5\' 1"  (1.549 m)   Wt 68.4 kg   LMP 05/24/2019 (Exact Date)   BMI 28.49 kg/m   Physical Exam Vitals and nursing note reviewed.  Constitutional:      Appearance: She is well-developed.  HENT:     Head: Normocephalic and atraumatic.  Eyes:     Conjunctiva/sclera: Conjunctivae normal.     Pupils: Pupils are equal, round, and reactive to light.  Cardiovascular:     Rate and Rhythm: Normal rate and regular rhythm.     Heart sounds: Normal heart sounds.  Pulmonary:     Effort: Pulmonary effort is normal.     Breath sounds: Normal breath sounds.  Abdominal:     Comments: Gravid abdomen  Musculoskeletal:        General: Normal range of motion.     Cervical back: Normal range of motion.  Skin:    General: Skin is warm and dry.   Neurological:     Mental Status: She is alert and oriented to person, place, and time.     ED Results / Procedures / Treatments   Labs (all labs ordered are listed, but only abnormal results are displayed) Labs Reviewed - No data to display  EKG None  Radiology US MFM Fetal BPP Wo Non Stress  Result Date: 02/09/2020 ----------------------------------------------------------------------  OBSTETRICS REPORT                        (Signed Final 02/09/2020 11:48 pm) ---------------------------------------------------------------------- Patient Info  ID #:       [id number]                          D.O.B.:  Jun 20, 1990 (29 yrs)  Name:       Shirley Baker                    Visit Date: 02/09/2020 09:42 am ---------------------------------------------------------------------- Performed By  Attending:        Lin Landsmanorenthian Booker      Ref. Address:      [address]                    [place]                                                              [place], [place]                                                              [id number]  Performed By:     Birdena CrandallYasemin Karatas        [facility]  Referred By:      [facility] ---------------------------------------------------------------------- Orders  #  Description                           Code        Ordered By  1  [place] MFM FETAL BPP WO NON               76819.01    CAROLINE NEILL     STRESS ----------------------------------------------------------------------  #  Order #                     Accession #                Episode #  1  [id number]                   [id number]                 [id number] ---------------------------------------------------------------------- Indications  Decreased fetal movements, third trimester,     O36.8130  unspecified  Abdominal pain in pregnancy (contractions)      O99.89  History of sickle cell trait                     Z86.2  Short interval between pregancies, 2nd          O09.892  trimester  Poor obstetric history: Previous fetal growth   O09.299  restriction (FGR)  Encounter for other antenatal screening         Z36.2  follow-up  [redacted] weeks gestation of pregnancy                 Z3A.37 ---------------------------------------------------------------------- Vital Signs                                                 Height:        5'1" ---------------------------------------------------------------------- Fetal Evaluation  Num Of Fetuses:          1  Fetal Heart Rate(bpm):   138  Cardiac Activity:        Observed  Presentation:            Oblique, head to maternal RT-up  Placenta:                Fundal  Amniotic Fluid  AFI FV:      Within normal limits  AFI Sum(cm)     %Tile       Largest Pocket(cm)  14.9            56          6.1  RUQ(cm)       RLQ(cm)       LUQ(cm)        LLQ(cm)  2.8           4.7           1.3            6.1 ---------------------------------------------------------------------- Biophysical Evaluation  Amniotic F.V:   Within normal limits       F. Tone:         Observed  F. Movement:    Observed  Score:           8/8  F. Breathing:   Observed ---------------------------------------------------------------------- OB History  Blood Type:   B+  Maternal Racial/Ethnic Group:   Black (non-Hispanic)  Gravidity:    4         Term:   3        Prem:   0        SAB:   0  TOP:          0       Ectopic:  0        Living: 3 ---------------------------------------------------------------------- Gestational Age  LMP:           37w 2d        Date:  05/24/19                 EDD:   02/28/20  Best:          37w 2d     Det. By:  LMP  (05/24/19)          EDD:   02/28/20 ---------------------------------------------------------------------- Anatomy  Stomach:               Appears normal, left   Bladder:                Appears normal                         sided  Kidneys:               Appear normal  ---------------------------------------------------------------------- Cervix Uterus Adnexa  Cervix  Length:            3.2  cm.  Normal appearance by transabdominal scan. Closed ---------------------------------------------------------------------- Impression  Limited exam to assess decreased fetal movement  Biophysical profile 8/8 with good fetal movement and  amniotic fluid volume ---------------------------------------------------------------------- Recommendations  Follow up as clinically indicated. ----------------------------------------------------------------------               Lin Landsman, MD Electronically Signed Final Report   02/09/2020 11:48 pm ----------------------------------------------------------------------   Procedures Procedures (including critical care time)  Medications Ordered in ED Medications - No data to display  ED Course  I have reviewed the triage vital signs and the nursing notes.  Pertinent labs & imaging results that were available during my care of the patient were reviewed by me and considered in my medical decision making (see chart for details).    MDM Rules/Calculators/A&P  G4P3 at [redacted]w[redacted]d gestation here with contractions.  Seen at [facility]U last night for same.  States now more intense and occurring every 4 mins or so.  Denies feeling water break or significant bleeding. Reports baby is breach and has been for quite some time.  Evaluated by rapid OB RN at beside, will transfer to MAU for ongoing evaluation and care.  Final Clinical Impression(s) / ED Diagnoses Final diagnoses:  Uterine contractions during pregnancy    Rx / DC Orders ED Discharge Orders    None       Garlon Hatchet, PA-C 02/10/20 0244    Little, Ambrose Finland, MD 02/10/20 [phone]

## 2020-02-10 NOTE — Op Note (Signed)
Shirley Baker PROCEDURE DATE: 02/10/2020  PREOPERATIVE DIAGNOSES: Intrauterine pregnancy at [redacted]w[redacted]d weeks gestation; malpresentation: oblique/transverse lie with head to maternal upper right; declined external cephalic version; desires long term contraception  POSTOPERATIVE DIAGNOSES: The same  PROCEDURE: Primary Low Transverse with conversion to Classical Cesarean Section (J Hysterotomy); Liletta Intrauterine Device Insertion  SURGEON:  Dr. Jaynie Collins  ASSISTANT:  Dr. Mart Piggs  ANESTHESIOLOGY TEAM: Anesthesiologist: Beryle Lathe, MD CRNA: Trellis Paganini, CRNA  INDICATIONS: Shirley Baker is a 29 y.o. [phone] at [redacted]w[redacted]d here for cesarean section secondary to the indications listed under preoperative diagnoses; please see preoperative note for further details.  The risks of surgery were discussed with the patient including but were not limited to: bleeding which may require transfusion or reoperation; infection which may require antibiotics; injury to bowel, bladder, ureters or other surrounding organs; injury to the fetus; need for additional procedures including hysterectomy in the event of a life-threatening hemorrhage; formation of adhesions; placental abnormalities wth subsequent pregnancies; incisional problems; thromboembolic phenomenon and other postoperative/anesthesia complications.  The patient concurred with the proposed plan, giving informed written consent for the procedure.    FINDINGS:  Viable female infant in transverse back down, head to maternal right presentation. Arm also presented after initial low transverse hysterotomy was done.  This hysterotomy had to be extended to the upper left fundal portion in a 'J' given the immense difficulty in delivering the infant.  After minutes of trying to do internal cephalic version that did not work, pushing the arms in, trying to deliver in breech or cephalic presentation which did not work, further exposure was needed.  Fetal head  was flexed into chest with a double, tight nuchal cord present. Finally, with enough exposure, the fetal head was able to be reached adequately and delivered with fundal pressure. Infant was immediately handed to awaiting Neonatology team and aggressively resuscitated, needed PPV for 2-3 minutes.  Apgars 2/6/7, arterial cord pH 7.02.  Infant was taken to the NICU for further evaluation and observation.   Clear amniotic fluid.  Intact placenta, three vessel cord.  Normal uterus, fallopian tubes and ovaries bilaterally.  ANESTHESIA: Spinal INTRAVENOUS FLUIDS: 2000 ml of LR, 250 ml of albumin ESTIMATED BLOOD LOSS: 1460 ml URINE OUTPUT:  300 ml SPECIMENS: Placenta sent to pathology COMPLICATIONS: None immediate  PROCEDURE IN DETAIL:  The patient preoperatively received intravenous antibiotics and had sequential compression devices applied to her lower extremities.  She was then taken to the operating room where spinal anesthesia was administered and was found to be adequate. She was then placed in a dorsal supine position with a leftward tilt, and prepped and draped in a sterile manner.  A foley catheter was placed into her bladder and attached to constant gravity.  After an adequate timeout was performed, a Pfannenstiel skin incision was made with scalpel and carried through to the underlying layer of fascia. The fascia was incised in the midline, and this incision was extended bilaterally using the Mayo scissors.  Kocher clamps were applied to the superior aspect of the fascial incision and the underlying rectus muscles were dissected off bluntly and sharply.  A similar process was carried out on the inferior aspect of the fascial incision. The rectus muscles were separated in the midline and the peritoneum was entered bluntly. The Alexis self-retaining retractor was introduced into the abdominal cavity.  Attention was turned to the lower uterine segment where a low transverse hysterotomy was made with a  scalpel and extended bilaterally bluntly.  The fluid was ruptured, and the fetal arm and back were noted to be presenting.  Attempted to do an internal cephalic version, but this was unsuccessful as mentioned above. The hysterotomy was extended in a J fashion towards the left fundal region, this provided more exposure but delivery was still difficult.  After a few minutes of trying different maneuvers, the infant was successfully delivered in cephalic presentation, the cord was immediately clamped and cut, and the infant was handed over to the awaiting neonatology team for resuscitation as mentioned above.  Uterine massage was then administered, and the placenta delivered intact with a three-vessel cord. The uterus was then cleared of clots and debris.  The hysterotomy  Vertical extension was closed with 0 Vicryl in a running locked fashion, and two imbricating layers were also placed with 0 Monocryl. The Liletta IUD was then placed in the upper fundal location, and the strings were pushed down through the cervix into the vagina.   The transverse portion of the hysterotomy was then closed with 0 Vicryl running locked stitch and an imbricating layer.  Figure-of-eight 0 Vicryl serosal stitches were placed to help with hemostasis.  The pelvis was cleared of all clot and debris. Hemostasis was confirmed on all surfaces; Surgicel was placed over the closed hysterotomy and the extension.  The retractor was removed.  The peritoneum was closed with a 0 Vicryl running stitch. The fascia was then closed using 0 Vicryl  in a running fashion.  The subcutaneous layer was irrigated, and the skin was closed with a 4-0 Vicryl subcuticular stitch. The patient tolerated the procedure well. Sponge, instrument and needle counts were correct x 3.  She was taken to the recovery room in stable condition.    Jaynie Collins, MD, FACOG Obstetrician & Gynecologist, [facility] for [employer], [facility]

## 2020-02-10 NOTE — Lactation Note (Signed)
This note was copied from a baby's chart. Lactation Consultation Note  Infant is 37 weeks 9 hours old in the NICU. LC reached out to the RN, Thomos Lemons, who states Mom is formula feeding and stated she will not be breastfeeding.

## 2020-02-10 NOTE — Progress Notes (Signed)
I called Dr Donavan Foil for a Pt status updateDr Donavan Foil is informed of stable orthostatic VS but pt was dizzy with standing. The pt had not eaten prior to standing. She is tolerating solid food now. Th LTCS dressing has NO new drainage. No new orders received.

## 2020-02-10 NOTE — Progress Notes (Signed)
Subjective: Late entry: Pt originally seen at 0900 and again at 1200.  CTSP by nursing staff due to bleeding/ drainage from c section incision.  Upon arrival the honeycomb bandage appeared 2/3 saturated.  The bandage was removed and the incision appeared intact.  A small amount of seepage was noted on the patient's right side and midline area.  Most of this was seen with fundal pressure and pressure on incision line.  The incision was rebandaged with pressure dressing reapplied.  MD saw pt again at 1200 and the bandage was still intact with no apparent drainage noted. Objective: Vital signs in last 24 hours: Temp:  [93.6 F (34.2 C)-98.2 F (36.8 C)] 97.6 F (36.4 C) (09/12 1155) Pulse Rate:  [56-89] 56 (09/12 1309) Resp:  [10-20] 18 (09/12 1309) BP: (91-140)/(44-97) 99/65 (09/12 1309) SpO2:  [88 %-100 %] 100 % (09/12 1309) Weight:  [68.4 kg] 68.4 kg (09/12 0221) Weight change:   Intake/Output from previous day: 09/11 0701 - 09/12 0700 In: 2750 [I.V.:2500; IV Piggyback:250] Out: 1760 [Urine:300; Blood:1460] Intake/Output this shift: Total I/O In: 480 [P.O.:480] Out: 450 [Urine:450]  see above note  Lab Results: Recent Labs    02/10/20 0326 02/10/20 0608  WBC 8.0 9.7  HGB 13.6 10.0*  HCT 40.4 30.1*  PLT 211 162  158   BMET: No results for input(s): NA, K, CL, CO2, GLUCOSE, BUN, CREATININE, CALCIUM in the last 72 hours. No results for input(s): PTH in the last 72 hours. Iron Studies: No results for input(s): IRON, TIBC, TRANSFERRIN, FERRITIN in the last 72 hours. Studies/Results: [place] MFM Fetal BPP Wo Non Stress  Result Date: 02/09/2020 ----------------------------------------------------------------------  OBSTETRICS REPORT                        (Signed Final 02/09/2020 11:48 pm) ---------------------------------------------------------------------- Patient Info  ID #:       [id number]                          D.O.B.:  1990-06-05 (29 yrs)  Name:       Shirley Baker                     Visit Date: 02/09/2020 09:42 am ---------------------------------------------------------------------- Performed By  Attending:        Lin Landsman      Ref. Address:      [address]                    [place]                                                              [place], [place]                                                              [id number]  Performed By:     Birdena Crandall        [facility]  Referred By:      [facility] ---------------------------------------------------------------------- Orders  #  Description                           Code        Ordered By  1  [place] MFM FETAL BPP WO NON               76819.01    CAROLINE NEILL     STRESS ----------------------------------------------------------------------  #  Order #                     Accession #                Episode #  1  [id number]                   [id number]                 [id number] ---------------------------------------------------------------------- Indications  Decreased fetal movements, third trimester,     O36.8130  unspecified  Abdominal pain in pregnancy (contractions)      O99.89  History of sickle cell trait                    Z86.2  Short interval between pregancies, 2nd          O09.892  trimester  Poor obstetric history: Previous fetal growth   O09.299  restriction (FGR)  Encounter for other antenatal screening         Z36.2  follow-up  [redacted] weeks gestation of pregnancy                 Z3A.37 ---------------------------------------------------------------------- Vital Signs                                                 Height:        5'1" ---------------------------------------------------------------------- Fetal Evaluation  Num Of Fetuses:          1  Fetal Heart Rate(bpm):   138  Cardiac Activity:        Observed  Presentation:            Oblique, head to maternal RT-up  Placenta:                 Fundal  Amniotic Fluid  AFI FV:      Within normal limits  AFI Sum(cm)     %Tile       Largest Pocket(cm)  14.9            56          6.1  RUQ(cm)       RLQ(cm)       LUQ(cm)        LLQ(cm)  2.8           4.7           1.3            6.1 ---------------------------------------------------------------------- Biophysical  Evaluation  Amniotic F.V:   Within normal limits       F. Tone:         Observed  F. Movement:    Observed                   Score:           8/8  F. Breathing:   Observed ---------------------------------------------------------------------- OB History  Blood Type:   B+  Maternal Racial/Ethnic Group:   Black (non-Hispanic)  Gravidity:    4         Term:   3        Prem:   0        SAB:   0  TOP:          0       Ectopic:  0        Living: 3 ---------------------------------------------------------------------- Gestational Age  LMP:           37w 2d        Date:  05/24/19                 EDD:   02/28/20  Best:          37w 2d     Det. By:  LMP  (05/24/19)          EDD:   02/28/20 ---------------------------------------------------------------------- Anatomy  Stomach:               Appears normal, left   Bladder:                Appears normal                         sided  Kidneys:               Appear normal ---------------------------------------------------------------------- Cervix Uterus Adnexa  Cervix  Length:            3.2  cm.  Normal appearance by transabdominal scan. Closed ---------------------------------------------------------------------- Impression  Limited exam to assess decreased fetal movement  Biophysical profile 8/8 with good fetal movement and  amniotic fluid volume ---------------------------------------------------------------------- Recommendations  Follow up as clinically indicated. ----------------------------------------------------------------------               Lin Landsman, MD Electronically Signed Final Report   02/09/2020 11:48 pm  ----------------------------------------------------------------------   I have reviewed the patient's current medications.  Assessment/Plan: Postoperative bleeding and complication Postoperative coags and fibrinogen were normal. Pt had received TXA during the procedure.  A second dose of TXA was administered at this time. Second set of of coags and fibrinogen have been ordered. Will follow closely.     LOS: 0 days   Warden Fillers 02/10/2020,1:11 PM

## 2020-02-10 NOTE — MAU Note (Signed)
OB OR charge nurse notified.  [facility] notified.  MB charge nurse notified---520 after OR. OB Anesthesiologist notified around 0500 am due to NPO.

## 2020-02-10 NOTE — Discharge Summary (Signed)
Postpartum Discharge Summary  Date of Service updated 02/13/20     Patient Name: Shirley Baker DOB: [birth year] MRN: [id number]  Date of admission: 02/10/2020 Delivery date:02/10/2020  Delivering provider: Verita Schneiders A  Date of discharge: 02/13/2020  Admitting diagnosis: Labor and delivery affected by malposition and malpresentation [O32.9XX0] Intrauterine pregnancy: [redacted]w[redacted]d    Secondary diagnosis:  Active Problems:   Sickle cell trait ([place]   Supervision of high risk pregnancy, antepartum   Short interval between pregnancies affecting pregnancy, antepartum   Labor and delivery affected by malposition and malpresentation   Postpartum hemorrhage   Delivery by classical cesarean section   Encounter for IUD insertion   Transverse fetal lie, delivered, current hospitalization  Additional problems: none    Discharge diagnosis: Term Pregnancy Delivered                                              Post partum procedures:NA Augmentation: N/A Complications: [id number] Hospital course: Onset of Labor With Unplanned C/S   29y.o. yo [id number] 30w3das [place] [place]. Patient presented with contractions every 2-3 minutes with cervical dilation 3cm. Upon presentation fetus was found to be transverse oblique and patient declined ECV and elected to proceed with cesarean section. Patient progressed spontaneously to 5cm in the MAU and was brought for urgent cesarean. The patient went for cesarean section due to Malpresentation. Delivery details as follows: Membrane Rupture Time/Date: 4:36 AM ,02/10/2020   Delivery Method:C-Section, Classical  Details of operation can be found in separate operative note. Patient had an uncomplicated postpartum course.  She is ambulating,tolerating a regular diet, passing flatus, and urinating well.  Patient is discharged home in stable condition 02/13/20.  Newborn Data: Birth date:02/10/2020  Birth time:4:41 AM  Gender:Female   Living status:Living  Apgars:2 ,6  Weight:2730 g   Magnesium Sulfate received: No BMZ received: No Rhophylac:N/A MMR:N/A T-DaP:Given prenatally Flu: No Transfusion:No  Physical exam  Vitals:   02/12/20 0547 02/12/20 1535 02/12/20 2024 02/13/20 0520  BP: 120/80 123/81 131/81 123/77  Pulse: 95 87 94 95  Resp: 18 20 18 18   Temp: 97.8 F (36.6 C) 98.7 F (37.1 C) 98.5 F (36.9 C) 98.7 F (37.1 C)  TempSrc: Oral Oral Oral Oral  SpO2: 100% 100% 100% 100%  Weight:      Height:       General: alert Lochia: appropriate Uterine Fundus: firm Incision: Healing well with no significant drainage DVT Evaluation: No evidence of DVT seen on physical exam. Labs: Lab Results  Component Value Date   WBC 10.0 02/11/2020   HGB 8.1 (L) 02/11/2020   HCT 24.5 (L) 02/11/2020   MCV 92.8 02/11/2020   PLT 142 (L) 02/11/2020   CMP Latest Ref Rng & Units 02/11/2020  Creatinine 0.44 - 1.00 mg/dL 0.68   Edinburgh Score: Edinburgh Postnatal Depression Scale Screening Tool 02/12/2020  I have been able to laugh and see the funny side of things. (No Data)  I have looked forward with enjoyment to things. -  I have blamed myself unnecessarily when things went wrong. -  I have been anxious or worried for no good reason. -  I have felt scared or panicky for no good reason. -  Things have been getting on top of me. -  I have been so unhappy that I have had  difficulty sleeping. -  I have felt sad or miserable. -  I have been so unhappy that I have been crying. -  The thought of harming myself has occurred to me. Flavia Shipper Postnatal Depression Scale Total -     After visit meds:  Allergies as of 02/13/2020   No Known Allergies     Medication List    STOP taking these medications   Comfort Fit Maternity Supp Med Misc   Concept OB 130-92.4-1 MG Caps   cyclobenzaprine 10 MG tablet Commonly known as: FLEXERIL   Doxylamine-Pyridoxine 10-10 MG Tbec Commonly known as: Diclegis     TAKE  these medications   ferrous sulfate 325 (65 FE) MG tablet Take 1 tablet (325 mg total) by mouth daily with breakfast.   ibuprofen 600 MG tablet Commonly known as: ADVIL Take 1 tablet (600 mg total) by mouth every 6 (six) hours.   multivitamin-prenatal 27-0.8 MG Tabs tablet Take 1 tablet by mouth daily.   oxyCODONE 5 MG immediate release tablet Commonly known as: Oxy IR/ROXICODONE Take 1 tablet (5 mg total) by mouth every 4 (four) hours as needed for moderate pain.            Discharge Care Instructions  (From admission, onward)         Start     Ordered   02/13/20 0000  Discharge wound care:       Comments: Remove dressing on 02/15/20   02/13/20 0958           Discharge home in stable condition Infant Feeding: Bottle Infant Disposition:home with mother Discharge instruction: per After Visit Summary and Postpartum booklet. Activity: Advance as tolerated. Pelvic rest for 6 weeks.  Diet: routine diet Future Appointments: Future Appointments  Date Time Provider [place]  02/18/2020 10:00 AM [facility] NURSE [facility]  03/11/2020  3:55 PM Tresea Mall, CNM [facility]   Follow up Visit:   Please schedule this patient for a In person postpartum visit in 4 weeks with the following provider: Any provider. Additional Postpartum F/U:Incision check 1 week  Low risk pregnancy complicated by: n/a Delivery mode:  C-Section, Classical  Anticipated Birth Control:  PP IUD placed   02/13/2020 Chancy Milroy, MD

## 2020-02-11 ENCOUNTER — Encounter (HOSPITAL_COMMUNITY): Payer: Self-pay | Admitting: Obstetrics & Gynecology

## 2020-02-11 LAB — CBC
HCT: 24.5 % — ABNORMAL LOW (ref 36.0–46.0)
Hemoglobin: 8.1 g/dL — ABNORMAL LOW (ref 12.0–15.0)
MCH: 30.7 pg (ref 26.0–34.0)
MCHC: 33.1 g/dL (ref 30.0–36.0)
MCV: 92.8 fL (ref 80.0–100.0)
Platelets: 142 10*3/uL — ABNORMAL LOW (ref 150–400)
RBC: 2.64 MIL/uL — ABNORMAL LOW (ref 3.87–5.11)
RDW: 13.4 % (ref 11.5–15.5)
WBC: 10 10*3/uL (ref 4.0–10.5)
nRBC: 0 % (ref 0.0–0.2)

## 2020-02-11 LAB — CREATININE, SERUM
Creatinine, Ser: 0.68 mg/dL (ref 0.44–1.00)
GFR calc Af Amer: >60 mL/min (ref >60)
GFR calc non Af Amer: >60 mL/min (ref >60)

## 2020-02-11 LAB — CULTURE, BETA STREP (GROUP B ONLY)

## 2020-02-11 MED ORDER — GABAPENTIN 100 MG PO CAPS
200.0000 mg | ORAL_CAPSULE | Freq: Two times a day (BID) | ORAL | Status: DC
  Administered 2020-02-11 – 2020-02-12 (×3): 200 mg via ORAL
  Filled 2020-02-11 (×3): qty 2

## 2020-02-11 MED ORDER — IBUPROFEN 600 MG PO TABS
600.0000 mg | ORAL_TABLET | Freq: Four times a day (QID) | ORAL | Status: DC
  Administered 2020-02-11 – 2020-02-13 (×8): 600 mg via ORAL
  Filled 2020-02-11 (×9): qty 1

## 2020-02-11 MED ORDER — SODIUM CHLORIDE 0.9 % IV SOLN
510.0000 mg | INTRAVENOUS | Status: DC
  Administered 2020-02-11: 510 mg via INTRAVENOUS
  Filled 2020-02-11: qty 17

## 2020-02-11 MED ORDER — HYDROMORPHONE HCL 2 MG PO TABS
2.0000 mg | ORAL_TABLET | ORAL | Status: DC | PRN
  Administered 2020-02-11 – 2020-02-12 (×5): 2 mg via ORAL
  Filled 2020-02-11 (×5): qty 1

## 2020-02-11 NOTE — Progress Notes (Signed)
POSTPARTUM PROGRESS NOTE  Subjective: Shirley Baker is a 29 y.o. [id number] POD#1 s/p LTCS for fetal malpresentation at at [redacted]w[redacted]d.  She reports she doing fair. Struggling with dizziness and pain. Also reports feeling very distended. Not passing gas but reports no nausea and is tolerating PO without issue. No vomiting. No acute events overnight. She denies any problems with ambulating, voiding or po intake. Pain is poorly controlled.  Lochia is moderate.  Objective: Blood pressure 114/77, pulse 88, temperature 98.9 F (37.2 C), temperature source Oral, resp. rate 18, height 5\' 1"  (1.549 m), weight 68.4 kg, last menstrual period 05/24/2019, SpO2 100 %, unknown if currently breastfeeding.  Physical Exam:  General: alert, cooperative and no distress Chest: no respiratory distress Abdomen: abdomen is mildly distended but soft, appropriately tender to palpation. Bowel sounds are normoactive.  Uterine Fundus: firm and at level of umbilicus. Incision site honeycomb saturated partially saturated with blood, no surrounding erythema or drainage.  Extremities: No calf swelling or tenderness  no edema  Recent Labs    02/10/20 2233 02/11/20 0432  HGB 8.3* 8.1*  HCT 24.9* 24.5*    Assessment/Plan: Shirley Baker is a 29 y.o. 26 s/p pLTCS for fetal malpresentation at [redacted]w[redacted]d. Course complicated by difficult extraction, uterine J-incision, PPH (1.5L). Post placental Liletta inserted.   Routine Postpartum Care: Doing well, pain well-controlled.  -- Continue routine care, lactation support  -- Pain: add on Motrin (no NSAID ordered), continue roxicodone. Add on gabapentin BID. Dilaudid po prn for breakthrough pain.  --PPH: EBL 1500. Hgb 11 --> 8.1. S/p IV Feraheme today, continue PO iron.   -- Contraception: s/p PP Liletta -- Feeding: both       Dispo: Plan for discharge POD#2 or 3.  [redacted]w[redacted]d, MD OB Fellow, Faculty Practice 02/11/2020 4:48 PM

## 2020-02-11 NOTE — Progress Notes (Signed)
Pt stated upon assessment at 2036 that she felt dizzy in the bed and sometimes had double vision. Pt expressed she wanted to go to NICU to see baby as she had not seen him since the morning. VSS stable (BP 111/74), fundus firm, lochia scant and pressure dressing unchanged. Encouraged pt to eat and drink as tolerated. Pt assisted up to wheelchair and tolerated sitting in wheelchair in NICU for almost an hour before she requested to come back to her room. States she started feeling lightheaded and seeing double. Husband and NT assisted pt back to room via wheelchair. BP checked once pt back in bed and was 106/74. Notified Dr. Germaine Pomfret of pt's c/o dizziness and intermittent double vision. CBC ordered and will have pt rest in bed for now. Pt c/o headache 7/10 and medicated with IV toradol at this time. Will give scheduled tylenol at midnight. Will continue to closely monitor pt.

## 2020-02-12 ENCOUNTER — Encounter (HOSPITAL_COMMUNITY): Payer: Self-pay | Admitting: Obstetrics & Gynecology

## 2020-02-12 LAB — SURGICAL PATHOLOGY

## 2020-02-12 NOTE — Progress Notes (Signed)
Post Operative Day 2 Subjective: Shirley Baker  is a 29 y.o. [id number] s/p C/S at [redacted]w[redacted]d.  She reports she is doing well. No acute events overnight. She denies any problems with ambulating, voiding or po intake. Denies nausea or vomiting. Pain is well controlled on ibuprofen.  Lochia is moderate and improving.   Objective: Blood pressure 123/81, pulse 87, temperature 98.7 F (37.1 C), temperature source Oral, resp. rate 20, height 5\' 1"  (1.549 m), weight 68.4 kg, last menstrual period 05/24/2019, SpO2 100 %, unknown if currently breastfeeding.  Physical Exam:  General: alert, cooperative and no distress Lochia: appropriate Uterine Fundus: firm Incision: healing well, no dehiscence, no significant erythema, pressure dressing still in place  Honeycomb dressing is completely saturated DVT Evaluation: No evidence of DVT seen on physical exam. Negative Homan's sign. No cords or calf tenderness. No significant calf/ankle edema.  Recent Labs    02/10/20 2233 02/11/20 0432  HGB 8.3* 8.1*  HCT 24.9* 24.5*    Assessment/Plan: Plan for discharge tomorrow and Circumcision prior to discharge   LOS: 2 days   02/13/20, CNM 02/12/2020, 4:37 PM

## 2020-02-13 LAB — BPAM RBC
Blood Product Expiration Date: 202110072359
Blood Product Expiration Date: 202110072359
Blood Product Expiration Date: 202110072359
Unit Type and Rh: 7300
Unit Type and Rh: 7300
Unit Type and Rh: 7300

## 2020-02-13 LAB — TYPE AND SCREEN
ABO/RH(D): B POS
Antibody Screen: NEGATIVE
Unit division: 0
Unit division: 0
Unit division: 0

## 2020-02-13 MED ORDER — IBUPROFEN 600 MG PO TABS
600.0000 mg | ORAL_TABLET | Freq: Four times a day (QID) | ORAL | 1 refills | Status: DC
Start: 2020-02-13 — End: 2022-02-16

## 2020-02-13 MED ORDER — FERROUS SULFATE 325 (65 FE) MG PO TABS
325.0000 mg | ORAL_TABLET | Freq: Every day | ORAL | 1 refills | Status: DC
Start: 2020-02-13 — End: 2022-02-16

## 2020-02-13 MED ORDER — OXYCODONE HCL 5 MG PO TABS
5.0000 mg | ORAL_TABLET | ORAL | 0 refills | Status: DC | PRN
Start: 2020-02-13 — End: 2020-02-18

## 2020-02-13 NOTE — Progress Notes (Signed)
Patient ID: Shirley Baker, female   DOB: [birth year], 29 y.o.   MRN: [id number]  POSTPARTUM PROGRESS NOTE  Post Partum Day 3  Subjective:  Shirley Baker is a 29 y.o. [id number] s/p C-section at [redacted]w[redacted]d. No acute events overnight. Pt denies problems with voiding or po intake. She endorses difficulty ambulating due to pain in her hips and upper legs. Says her nurse has been helping her get up and walk. She denies nausea or vomiting. Pain is moderately controlled. She has had flatus. She has had bowel movement (says she has had many BMs since bowel regimen yesterday). Lochia minimal. She endorses difficulty sleeping because her incision hurts in some positions.  Objective: Blood pressure 123/77, pulse 95, temperature 98.7 F (37.1 C), temperature source Oral, resp. rate 18, height 5\' 1"  (1.549 m), weight 68.4 kg, last menstrual period 05/24/2019, SpO2 100 %, unknown if currently breastfeeding.  Physical Exam:  General: alert, cooperative and no distress Chest: no respiratory distress Heart:regular rate, distal pulses intact Abdomen: soft, mildly distended, bowel sounds normoactive Uterine Fundus: firm, appropriately tender DVT Evaluation: no calf swelling or tenderness Extremities: no LE edema, sensation and motor intact in lower extremities, pain in hips with movement Skin: warm, dry  Recent Labs    02/10/20 2233 02/11/20 0432  HGB 8.3* 8.1*  HCT 24.9* 24.5*    Assessment/Plan: Shirley Baker is a 29 y.o. 30 s/p C-section at [redacted]w[redacted]d   PPD#3 - Doing okay. Difficulty ambulating: work with nurse, encourage getting up and walking Contraception: PP liletta done Feeding: both Circ: done Dispo: Plan for discharge today.   LOS: 3 days   [redacted]w[redacted]d, Medical Student 02/13/2020, 8:11 AM

## 2020-02-14 ENCOUNTER — Telehealth: Payer: Self-pay | Admitting: *Deleted

## 2020-02-14 ENCOUNTER — Encounter: Payer: Self-pay | Admitting: Obstetrics and Gynecology

## 2020-02-14 NOTE — Telephone Encounter (Signed)
Transition Care Management Unsuccessful Follow-up Telephone Call  Date of discharge and from where: 02/13/20, [facility]  Attempts:  1st Attempt  Reason for unsuccessful TCM follow-up call:  Left voice message.  Burnard Bunting, RN, BSN, CCRN Patient Engagement Center [phone]

## 2020-02-18 ENCOUNTER — Encounter: Payer: Self-pay | Admitting: *Deleted

## 2020-02-18 ENCOUNTER — Ambulatory Visit (INDEPENDENT_AMBULATORY_CARE_PROVIDER_SITE_OTHER): Payer: Medicaid Other | Admitting: Obstetrics and Gynecology

## 2020-02-18 ENCOUNTER — Other Ambulatory Visit: Payer: Self-pay

## 2020-02-18 VITALS — BP 123/88 | HR 76 | Ht 60.0 in | Wt 138.5 lb

## 2020-02-18 DIAGNOSIS — G8918 Other acute postprocedural pain: Secondary | ICD-10-CM

## 2020-02-18 MED ORDER — OXYCODONE HCL 5 MG PO TABS: 5.0000 mg | ORAL_TABLET | ORAL | 0 refills | Status: DC | PRN

## 2020-02-18 NOTE — Progress Notes (Signed)
   Subjective:    Patient ID: Shirley Baker, female    DOB: [birth year], 29 y.o.   MRN: [id number]  HPI  Pt seen in [facility] office for 1 week wound check.  She had complaints of her IUD string being too long, bilateral axillary masses and postoperative pain.  Per the nursing note she has been taking oxycodone and ibuprofen together q 4 hours.    Review of Systems  Constitutional: Negative.   HENT: Negative.   Eyes: Negative.   Respiratory: Negative.   Cardiovascular: Negative.   Gastrointestinal: Negative.   Genitourinary: Negative.   Musculoskeletal: Negative.   Neurological: Negative.   Hematological: Negative.        Objective:   Physical Exam Vitals reviewed.  Constitutional:      Appearance: Normal appearance.  HENT:     Head: Normocephalic and atraumatic.  Genitourinary:    Comments: Blue IUD strings noted outside of labia. Strings cut without difficulty with 3-4 cm of residual string outside of cervix, light to moderate uterine bleeding, appropriate lochia Musculoskeletal:     Comments: Bilateral swelling in both axilla, c/w extended breast tissue.  Both are mildly tender, no erythema noted  Skin:    Comments: Wound clean dry and intact, no erythema or drainage  Neurological:     General: No focal deficit present.     Mental Status: She is alert and oriented to person, place, and time.    Vitals:   02/18/20 1027  BP: 123/88  Pulse: 76          Assessment & Plan:   1. Postoperative pain  Pt will receive no more refills, refill given today due to complexity of her recent surgery - oxyCODONE (OXY IR/ROXICODONE) 5 MG immediate release tablet; Take 1 tablet (5 mg total) by mouth every 4 (four) hours as needed for severe pain or breakthrough pain.  Dispense: 10 tablet; Refill: 0  2.  IUD strings cut  F/u in 5 weeks for postpartum exam  Mariel Aloe, MD Faculty attending , Center for [facility].

## 2020-02-18 NOTE — Progress Notes (Signed)
Pt presents for incision check - s/p C/S on 9/12. Incision assessed and was found to be well healed. No redness, swelling or drainage was noted. Pt reports several concerns today. She has noticed swelling under both arms (armpit) which started 3 days ago. She also stated that she has long strings hanging out of her vagina. Pt was not aware of Rx for iron - has not been taking but will obtain from pharmacy today. Pt states she is having a lot of pain and soreness in abdomen and around her sides.  She has been taking both Ibuprofen and Oxycodone together. She is almost out of Oxycodone and requests a refill. Pt was advised to alternate her pain medications and not take them at the same time.

## 2020-02-20 ENCOUNTER — Encounter: Payer: Self-pay | Admitting: Advanced Practice Midwife

## 2020-03-11 ENCOUNTER — Other Ambulatory Visit: Payer: Self-pay

## 2020-03-11 ENCOUNTER — Ambulatory Visit (INDEPENDENT_AMBULATORY_CARE_PROVIDER_SITE_OTHER): Payer: Medicaid Other | Admitting: Advanced Practice Midwife

## 2020-03-11 ENCOUNTER — Encounter: Payer: Self-pay | Admitting: Advanced Practice Midwife

## 2020-03-11 DIAGNOSIS — Z30431 Encounter for routine checking of intrauterine contraceptive device: Secondary | ICD-10-CM | POA: Diagnosis not present

## 2020-03-11 DIAGNOSIS — Z98891 History of uterine scar from previous surgery: Secondary | ICD-10-CM

## 2020-03-11 NOTE — Progress Notes (Signed)
Post Partum Visit Note  Ernest Orr is a 29 y.o. G37P4004 female who presents for a postpartum visit. She is 4 weeks postpartum following a Cesarean Section.  I have fully reviewed the prenatal and intrapartum course. The delivery was at 37.3 gestational weeks.  Anesthesia: spinal. Postpartum course has been unremarkable. Baby is doing well. Baby is feeding by gerber good start gentle. Bleeding staining only. Bowel function is normal. Bladder function is normal. Patient is not sexually active. Contraception method is IUD. Postpartum depression screening: Negative.   The pregnancy intention screening data noted above was reviewed. Potential methods of contraception were discussed. The patient elected to proceed with IUD or IUS.    Edinburgh Postnatal Depression Scale - 03/11/20 1617      Edinburgh Postnatal Depression Scale:  In the Past 7 Days   I have been able to laugh and see the funny side of things. 0    I have looked forward with enjoyment to things. 0    I have blamed myself unnecessarily when things went wrong. 0    I have been anxious or worried for no good reason. 0    I have felt scared or panicky for no good reason. 0    Things have been getting on top of me. 0    I have been so unhappy that I have had difficulty sleeping. 0    I have felt sad or miserable. 0    I have been so unhappy that I have been crying. 0    The thought of harming myself has occurred to me. 0    Edinburgh Postnatal Depression Scale Total 0            The following portions of the patient's history were reviewed and updated as appropriate: allergies, current medications, past family history, past medical history, past social history, past surgical history and problem list.  Review of Systems Pertinent items are noted in HPI.    Objective:  Blood pressure 115/80, pulse 64, weight 131 lb (59.4 kg), last menstrual period 05/24/2019, not currently breastfeeding.  Physical Exam Vitals and nursing  note reviewed. Exam conducted with a chaperone present.  Constitutional:      General: She is not in acute distress. HENT:     Head: Normocephalic.  Cardiovascular:     Rate and Rhythm: Normal rate.  Pulmonary:     Effort: Pulmonary effort is normal.  Abdominal:     Palpations: Abdomen is soft.     Tenderness: There is no abdominal tenderness.  Genitourinary:    Comments:  External: no lesion Vagina: small amount of white discharge Cervix: pink, smooth, no CMT, IUD sting seen. String trimmed to about 3cm.  Uterus: NSSC   Skin:    General: Skin is warm and dry.  Neurological:     Mental Status: She is alert and oriented to person, place, and time.  Psychiatric:        Mood and Affect: Mood normal.     Assessment:    Normal postpartum exam. Pap smear 04/03/2018, normal  Patient had PP IUD placed, string check today   Plan:   Essential components of care per ACOG recommendations:  1.  Mood and well being: Patient with negative depression screening today. Reviewed local resources for support.  - Patient does not use tobacco. NA If using tobacco we discussed reduction and for recently cessation risk of relapse - hx of drug use? No  NA If yes, discussed support systems  2. Infant care and feeding:  -Patient currently breastmilk feeding? No NA If breastmilk feeding discussed return to work and pumping. If needed, patient was provided letter for work to allow for every 2-3 hr pumping breaks, and to be granted a private location to express breastmilk and refrigerated area to store breastmilk. Reviewed importance of draining breast regularly to support lactation. -Social determinants of health (SDOH) reviewed in EPIC. No concerns  3. Sexuality, contraception and birth spacing - Patient does not want a pregnancy in the next year.  Desired family size is unsure # of children.  - Reviewed forms of contraception in tiered fashion. Patient desired IUD today.   - Discussed birth  spacing of 18 months  4. Sleep and fatigue -Encouraged family/partner/community support of 4 hrs of uninterrupted sleep to help with mood and fatigue  5. Physical Recovery  - Discussed patients delivery and complications - Patient had a NA degree laceration, perineal healing reviewed. Patient expressed understanding - Patient has urinary incontinence? No  NA Patient was referred to pelvic floor PT  - Patient is safe to resume physical and sexual activity  6.  Health Maintenance - Last pap smear done 03/2018 and was normal with negative HPV. NA, age Mammogram  81. Chronic Disease - PCP follow up  Thressa Sheller DNP, CNM  03/11/20  4:20 PM

## 2020-06-19 ENCOUNTER — Telehealth: Payer: Self-pay | Admitting: Nurse Practitioner

## 2020-06-19 ENCOUNTER — Ambulatory Visit: Payer: Medicaid Other | Admitting: Physician Assistant

## 2020-06-19 ENCOUNTER — Other Ambulatory Visit: Payer: Self-pay

## 2020-06-19 NOTE — Telephone Encounter (Signed)
Patient called to reschedule her appt. For today.  She stated that she does not have a sitter and will need a Friday on a later date and would like it in office.  Please call patient back to reschedule at [phone]

## 2020-07-27 ENCOUNTER — Emergency Department (HOSPITAL_COMMUNITY)
Admission: EM | Admit: 2020-07-27 | Discharge: 2020-07-28 | Disposition: A | Discharge status: Home / Self Care | Payer: Medicaid Other | Attending: Emergency Medicine | Admitting: Emergency Medicine

## 2020-07-27 ENCOUNTER — Other Ambulatory Visit: Payer: Self-pay

## 2020-07-27 ENCOUNTER — Encounter (HOSPITAL_COMMUNITY): Payer: Self-pay | Admitting: Emergency Medicine

## 2020-07-27 DIAGNOSIS — R1084 Generalized abdominal pain: Secondary | ICD-10-CM | POA: Diagnosis not present

## 2020-07-27 DIAGNOSIS — R109 Unspecified abdominal pain: Secondary | ICD-10-CM

## 2020-07-27 LAB — I-STAT BETA HCG BLOOD, ED (MC, WL, AP ONLY): I-stat hCG, quantitative: <5 m[IU]/mL (ref <5)

## 2020-07-27 LAB — CBC
HCT: 41.5 % (ref 36.0–46.0)
Hemoglobin: 14.6 g/dL (ref 12.0–15.0)
MCH: 31.3 pg (ref 26.0–34.0)
MCHC: 35.2 g/dL (ref 30.0–36.0)
MCV: 88.9 fL (ref 80.0–100.0)
Platelets: 245 10*3/uL (ref 150–400)
RBC: 4.67 MIL/uL (ref 3.87–5.11)
RDW: 13.2 % (ref 11.5–15.5)
WBC: 7.4 10*3/uL (ref 4.0–10.5)
nRBC: 0 % (ref 0.0–0.2)

## 2020-07-27 LAB — LIPASE, BLOOD: Lipase: 38 U/L (ref 11–51)

## 2020-07-27 LAB — COMPREHENSIVE METABOLIC PANEL
ALT: 17 U/L (ref 0–44)
AST: 17 U/L (ref 15–41)
Albumin: 3.6 g/dL (ref 3.5–5.0)
Alkaline Phosphatase: 54 U/L (ref 38–126)
Anion gap: 8 (ref 5–15)
BUN: 12 mg/dL (ref 6–20)
CO2: 24 mmol/L (ref 22–32)
Calcium: 8.9 mg/dL (ref 8.9–10.3)
Chloride: 104 mmol/L (ref 98–111)
Creatinine, Ser: 0.88 mg/dL (ref 0.44–1.00)
GFR, Estimated: >60 mL/min (ref >60)
Glucose, Bld: 92 mg/dL (ref 70–99)
Potassium: 3.6 mmol/L (ref 3.5–5.1)
Sodium: 136 mmol/L (ref 135–145)
Total Bilirubin: 1 mg/dL (ref 0.3–1.2)
Total Protein: 6.4 g/dL — ABNORMAL LOW (ref 6.5–8.1)

## 2020-07-27 LAB — URINALYSIS, ROUTINE W REFLEX MICROSCOPIC
Bilirubin Urine: NEGATIVE
Glucose, UA: NEGATIVE mg/dL
Hgb urine dipstick: NEGATIVE
Ketones, ur: NEGATIVE mg/dL
Leukocytes,Ua: NEGATIVE
Nitrite: NEGATIVE
Protein, ur: NEGATIVE mg/dL
Specific Gravity, Urine: 1.01 (ref 1.005–1.030)
pH: 6 (ref 5.0–8.0)

## 2020-07-27 NOTE — ED Triage Notes (Signed)
Pt c/o abdominal pain, denies nausea/vomiting/diarrhea.  

## 2020-07-28 ENCOUNTER — Encounter (HOSPITAL_COMMUNITY): Payer: Self-pay | Admitting: Emergency Medicine

## 2020-07-28 ENCOUNTER — Emergency Department (HOSPITAL_COMMUNITY): Payer: Medicaid Other

## 2020-07-28 DIAGNOSIS — R109 Unspecified abdominal pain: Secondary | ICD-10-CM | POA: Diagnosis not present

## 2020-07-28 MED ORDER — KETOROLAC TROMETHAMINE 60 MG/2ML IM SOLN: 60.0000 mg | Freq: Once | INTRAMUSCULAR | Status: DC

## 2020-07-28 MED ORDER — DICYCLOMINE HCL 10 MG PO CAPS: 10.0000 mg | ORAL_CAPSULE | Freq: Once | ORAL | Status: DC

## 2020-07-28 MED ORDER — DICYCLOMINE HCL 20 MG PO TABS: 20.0000 mg | ORAL_TABLET | Freq: Two times a day (BID) | ORAL | 0 refills | Status: DC

## 2020-07-28 NOTE — ED Notes (Signed)
Pt refusing to wait for discharge paperwork and discharge vital signs before leaving ED.  Pt able to ambulate out of the department independently and does not appear in distress.

## 2020-07-28 NOTE — ED Provider Notes (Signed)
[facility] EMERGENCY DEPARTMENT Provider Note   CSN: [id number] Arrival date & time: 07/27/20  2149     History Chief Complaint  Patient presents with  . Abdominal Pain    Shirley Baker is a 30 y.o. female.  The history is provided by the patient.  Abdominal Pain Pain location:  Generalized Pain quality: sharp   Pain radiates to:  Does not radiate Pain severity:  Moderate Onset quality:  Gradual Duration:  1 day Timing:  Constant Progression:  Unchanged Chronicity:  New Context: not retching, not sick contacts, not suspicious food intake and not trauma   Relieved by:  Nothing Worsened by:  Nothing Ineffective treatments:  None tried Associated symptoms: no anorexia, no belching, no chest pain, no chills, no constipation, no cough, no diarrhea, no dysuria, no fatigue, no fever, no flatus, no hematemesis, no hematochezia, no hematuria, no melena, no nausea, no shortness of breath, no sore throat, no vaginal bleeding, no vaginal discharge and no vomiting   Risk factors: no alcohol abuse   One day of abdominal pain.  No n/v/d.  No urinary or vaginal complaints.       Past Medical History:  Diagnosis Date  . Anemia   . Delivery by classical cesarean section 02/10/2020   J incision on uterus due to fetal malpresentation and difficulty with delivery NEEDS DELIVERY AT 36-37 WEEKS FOR SUBSEQUENT PREGNANCIES  . Heart murmur   . Ovarian cyst   . Syncope     Patient Active Problem List   Diagnosis Date Noted  . Labor and delivery affected by malposition and malpresentation 02/10/2020  . Postpartum hemorrhage 02/10/2020  . Delivery by classical cesarean section 02/10/2020  . Encounter for IUD insertion 02/10/2020  . Transverse fetal lie, delivered, current hospitalization 02/10/2020  . Supervision of high risk pregnancy, antepartum 08/16/2019  . Short interval between pregnancies affecting pregnancy, antepartum 08/16/2019  . Sickle cell trait (HCC)  04/14/2018  . History of low birth weight 04/14/2018    Past Surgical History:  Procedure Laterality Date  . CESAREAN SECTION N/A 02/10/2020   Procedure: CESAREAN SECTION;  Surgeon: Tereso Newcomer, MD;  Location: [facility] LD ORS;  Service: Obstetrics;  Laterality: N/A;  . NO PAST SURGERIES       OB History    Gravida  4   Para  4   Term  4   Preterm      AB      Living  4     SAB      IAB      Ectopic      Multiple  0   Live Births  4        Obstetric Comments  LBW        Family History  Problem Relation Age of Onset  . Healthy Mother   . Healthy Father   . Hearing loss Neg Hx     Social History   Tobacco Use  . Smoking status: Never Smoker  . Smokeless tobacco: Never Used  . Tobacco comment: hookah, last Aug 2019- patient denies 08/16/2019  Vaping Use  . Vaping Use: Never used  Substance Use Topics  . Alcohol use: No  . Drug use: No    Home Medications Prior to Admission medications   Medication Sig Start Date End Date Taking? Authorizing Provider  dicyclomine (BENTYL) 20 MG tablet Take 1 tablet (20 mg total) by mouth 2 (two) times daily. 07/28/20  Yes Ruwayda Curet, MD  ferrous  sulfate 325 (65 FE) MG tablet Take 1 tablet (325 mg total) by mouth daily with breakfast. Patient not taking: Reported on 02/18/2020 02/13/20   Hermina Staggers, MD  ibuprofen (ADVIL) 600 MG tablet Take 1 tablet (600 mg total) by mouth every 6 (six) hours. 02/13/20   Hermina Staggers, MD  oxyCODONE (OXY IR/ROXICODONE) 5 MG immediate release tablet Take 1 tablet (5 mg total) by mouth every 4 (four) hours as needed for severe pain or breakthrough pain. 02/18/20   Warden Fillers, MD  Prenatal Vit-Fe Fumarate-FA (MULTIVITAMIN-PRENATAL) 27-0.8 MG TABS tablet Take 1 tablet by mouth daily. 01/30/20   [provider]    Allergies    Patient has no known allergies.  Review of Systems   Review of Systems  Constitutional: Negative for chills, fatigue and fever.  HENT:  Negative for sore throat.   Eyes: Negative for visual disturbance.  Respiratory: Negative for cough and shortness of breath.   Cardiovascular: Negative for chest pain.  Gastrointestinal: Positive for abdominal pain. Negative for anorexia, constipation, diarrhea, flatus, hematemesis, hematochezia, melena, nausea and vomiting.  Genitourinary: Negative for dysuria, hematuria, vaginal bleeding and vaginal discharge.  Musculoskeletal: Negative for arthralgias.  Skin: Negative for rash.  Neurological: Negative for dizziness.  Psychiatric/Behavioral: Negative for agitation.  All other systems reviewed and are negative.   Physical Exam Updated Vital Signs BP 117/79   Pulse 67   Temp 98.5 F (36.9 C) (Oral)   Resp (!) 21   SpO2 100%   Physical Exam Vitals and nursing note reviewed.  Constitutional:      General: She is not in acute distress.    Appearance: Normal appearance.  HENT:     Head: Normocephalic and atraumatic.     Nose: Nose normal.  Eyes:     Conjunctiva/sclera: Conjunctivae normal.     Pupils: Pupils are equal, round, and reactive to light.  Cardiovascular:     Rate and Rhythm: Normal rate and regular rhythm.     Pulses: Normal pulses.     Heart sounds: Normal heart sounds.  Pulmonary:     Effort: Pulmonary effort is normal.     Breath sounds: Normal breath sounds.  Abdominal:     General: Abdomen is flat. Bowel sounds are normal.     Palpations: Abdomen is soft.     Tenderness: There is no abdominal tenderness. There is no guarding or rebound.  Musculoskeletal:        General: Normal range of motion.     Cervical back: Normal range of motion and neck supple.  Skin:    General: Skin is warm and dry.     Capillary Refill: Capillary refill takes less than 2 seconds.  Neurological:     General: No focal deficit present.     Mental Status: She is alert and oriented to person, place, and time.  Psychiatric:        Mood and Affect: Mood normal.        Behavior:  Behavior normal.     ED Results / Procedures / Treatments   Labs (all labs ordered are listed, but only abnormal results are displayed) Labs Reviewed  COMPREHENSIVE METABOLIC PANEL - Abnormal; Notable for the following components:      Result Value   Total Protein 6.4 (*)    All other components within normal limits  URINALYSIS, ROUTINE W REFLEX MICROSCOPIC - Abnormal; Notable for the following components:   Color, Urine STRAW (*)    All other  components within normal limits  LIPASE, BLOOD  CBC  I-STAT BETA HCG BLOOD, ED ([facility], [facility], [facility] ONLY)    EKG None  Radiology CT Renal Stone Study  Result Date: 07/28/2020 CLINICAL DATA:  Flank pain.  Kidney stones suspected EXAM: CT ABDOMEN AND PELVIS WITHOUT CONTRAST TECHNIQUE: Multidetector CT imaging of the abdomen and pelvis was performed following the standard protocol without IV contrast. COMPARISON:  None. FINDINGS: Lower chest: The lung bases are clear. The heart size is normal. Hepatobiliary: The liver is normal. Normal gallbladder.There is no biliary ductal dilation. Pancreas: Normal contours without ductal dilatation. No peripancreatic fluid collection. Spleen: Unremarkable. Adrenals/Urinary Tract: --Adrenal glands: Unremarkable. --Right kidney/ureter: No hydronephrosis or radiopaque kidney stones. --Left kidney/ureter: No hydronephrosis or radiopaque kidney stones. --Urinary bladder: Unremarkable. Stomach/Bowel: --Stomach/Duodenum: No hiatal hernia or other gastric abnormality. Normal duodenal course and caliber. --Small bowel: Unremarkable. --Colon: Unremarkable. --Appendix: Normal. Vascular/Lymphatic: Normal course and caliber of the major abdominal vessels. --No retroperitoneal lymphadenopathy. --No mesenteric lymphadenopathy. --No pelvic or inguinal lymphadenopathy. Reproductive: There is an IUD in place. Other: No ascites or free air. The abdominal wall is normal. Musculoskeletal. No acute displaced fractures. IMPRESSION: 1. No acute  abdominopelvic abnormality. 2. No radiopaque kidney stones. No hydronephrosis. 3. IUD in place. Electronically Signed   By: Katherine Mantlehristopher  Green M.D.   On: 07/28/2020 01:49    Procedures Procedures   Medications Ordered in ED Medications  ketorolac (TORADOL) injection 60 mg (has no administration in time range)  dicyclomine (BENTYL) capsule 10 mg (has no administration in time range)    ED Course  I have reviewed the triage vital signs and the nursing notes.  Pertinent labs & imaging results that were available during my care of the patient were reviewed by me and considered in my medical decision making (see chart for details).    Shirley Baker was evaluated in Emergency Department on 07/28/2020 for the symptoms described in the history of present illness. She was evaluated in the context of the global COVID-19 pandemic, which necessitated consideration that the patient might be at risk for infection with the SARS-CoV-2 virus that causes COVID-19. Institutional protocols and algorithms that pertain to the evaluation of patients at risk for COVID-19 are in a state of rapid change based on information released by regulatory bodies including the CDC and federal and state organizations. These policies and algorithms were followed during the patient's care in the ED.  Final Clinical Impression(s) / ED Diagnoses Final diagnoses:  Abdominal pain, unspecified abdominal location   Return for intractable cough, coughing up blood, fevers >100.4 unrelieved by medication, shortness of breath, intractable vomiting, chest pain, shortness of breath, weakness, numbness, changes in speech, facial asymmetry, abdominal pain, passing out, Inability to tolerate liquids or food, cough, altered mental status or any concerns. No signs of systemic illness or infection. The patient is nontoxic-appearing on exam and vital signs are within normal limits.  I have reviewed the triage vital signs and the nursing notes.  Pertinent labs & imaging results that were available during my care of the patient were reviewed by me and considered in my medical decision making (see chart for details). After history, exam, and medical workup I feel the patient has been appropriately medically screened and is safe for discharge home. Pertinent diagnoses were discussed with the patient. Patient was given return precautions. Rx / DC Orders ED Discharge Orders         Ordered    dicyclomine (BENTYL) 20 MG tablet  2 times daily  07/28/20 0214           Vittorio Mohs, MD 07/28/20 9924

## 2020-07-29 ENCOUNTER — Telehealth: Payer: Self-pay

## 2020-07-29 NOTE — Telephone Encounter (Signed)
Transition Care Management Unsuccessful Follow-up Telephone Call  Date of discharge and from where:  07/28/2020 from [facility]  Attempts:  1st Attempt  Reason for unsuccessful TCM follow-up call:  Left voice message

## 2020-07-30 NOTE — Telephone Encounter (Signed)
Transition Care Management Follow-up Telephone Call  Date of discharge and from where: 07/28/2020 from [facility]  How have you been since you were released from the hospital? Pt stated that she is feeling much better. Pt has not started the bentyl yet.   Any questions or concerns? No  Items Reviewed:  Did the pt receive and understand the discharge instructions provided? Yes   Medications obtained and verified? Yes   Other? No   Any new allergies since your discharge? No   Dietary orders reviewed? n/a  Do you have support at home? Yes   Functional Questionnaire: (I = Independent and D = Dependent) ADLs: I  Bathing/Dressing- I  Meal Prep- I  Eating- I  Maintaining continence- I  Transferring/Ambulation- I  Managing Meds- I   Follow up appointments reviewed:   PCP Hospital f/u appt confirmed? No  Pt encouraged to call for a follow up appt. Pt states that there is not much access to get an appt.   Specialist Hospital f/u appt confirmed? No    Are transportation arrangements needed? No   If their condition worsens, is the pt aware to call PCP or go to the Emergency Dept.? Yes  Was the patient provided with contact information for the PCP's office or ED? Yes  Was to pt encouraged to call back with questions or concerns? Yes

## 2020-07-30 NOTE — Telephone Encounter (Signed)
Transition Care Management Unsuccessful Follow-up Telephone Call  Date of discharge and from where:   07/28/2020 from [facility]  Attempts:  2nd Attempt  Reason for unsuccessful TCM follow-up call:  Left voice message

## 2020-11-06 ENCOUNTER — Ambulatory Visit: Payer: Medicaid Other | Attending: Physician Assistant | Admitting: Physician Assistant

## 2020-11-06 ENCOUNTER — Encounter: Payer: Self-pay | Admitting: Physician Assistant

## 2020-11-06 ENCOUNTER — Other Ambulatory Visit: Payer: Self-pay

## 2020-11-06 VITALS — BP 118/88 | HR 89 | Temp 98.1°F | Resp 16 | Ht 60.0 in | Wt 134.4 lb

## 2020-11-06 DIAGNOSIS — K429 Umbilical hernia without obstruction or gangrene: Secondary | ICD-10-CM | POA: Diagnosis not present

## 2020-11-06 DIAGNOSIS — B2790 Infectious mononucleosis, unspecified without complication: Secondary | ICD-10-CM | POA: Insufficient documentation

## 2020-11-06 DIAGNOSIS — Z791 Long term (current) use of non-steroidal anti-inflammatories (NSAID): Secondary | ICD-10-CM | POA: Diagnosis not present

## 2020-11-06 DIAGNOSIS — J029 Acute pharyngitis, unspecified: Secondary | ICD-10-CM

## 2020-11-06 LAB — POCT RAPID STREP A (OFFICE): Rapid Strep A Screen: NEGATIVE

## 2020-11-06 NOTE — Progress Notes (Signed)
cccPt presents with throat pain for weeks pain level -8  Pt stated that she went to have cool sculpting done and the technician stated that looks as if she has hernia

## 2020-11-06 NOTE — Progress Notes (Signed)
Patient ID: Shirley Baker, female   DOB: [birth year], 30 y.o.   MRN: [id number]   Shirley Baker, is a 31 y.o. female  VZC:[id number]  JOI:[id number]  DOB - 1990-10-01  Subjective:  Chief Complaint and HPI: Shirley Baker is a 30 y.o. female here today for sore throat X 2-3 weeks.  Improves with ibuprofen temporarily.  No fever.  No loss of taste or smell or covid type symptoms.  No N/V/D.  Painful all the time.  Appetite is good.  None of her 4 children are sick.  Also, wants to be checked for umbilical hernia.  She is trying to get cool sculpting and they won't let her do it bc they think she may have a hernia.  No pain.     ROS:   Constitutional:  No f/c, No night sweats, No unexplained weight loss. EENT:  No vision changes, No blurry vision, No hearing changes. No ear problems.  Respiratory: No cough, No SOB Cardiac: No CP, no palpitations GI:  No abd pain, No N/V/D. GU: No Urinary s/sx Musculoskeletal: No joint pain Neuro: No headache, no dizziness, no motor weakness.  Skin: No rash Endocrine:  No polydipsia. No polyuria.  Psych: Denies SI/HI  No problems updated.  ALLERGIES: No Known Allergies  PAST MEDICAL HISTORY: Past Medical History:  Diagnosis Date   Anemia    Delivery by classical cesarean section 02/10/2020   J incision on uterus due to fetal malpresentation and difficulty with delivery NEEDS DELIVERY AT 36-37 WEEKS FOR SUBSEQUENT PREGNANCIES   Heart murmur    Ovarian cyst    Syncope     MEDICATIONS AT HOME: Prior to Admission medications   Medication Sig Start Date End Date Taking? Authorizing Provider  dicyclomine (BENTYL) 20 MG tablet Take 1 tablet (20 mg total) by mouth 2 (two) times daily. 07/28/20   Palumbo, April, MD  ferrous sulfate 325 (65 FE) MG tablet Take 1 tablet (325 mg total) by mouth daily with breakfast. Patient not taking: Reported on 02/18/2020 02/13/20   Hermina Staggers, MD  ibuprofen (ADVIL) 600 MG tablet Take 1 tablet (600 mg total) by mouth  every 6 (six) hours. 02/13/20   Hermina Staggers, MD  oxyCODONE (OXY IR/ROXICODONE) 5 MG immediate release tablet Take 1 tablet (5 mg total) by mouth every 4 (four) hours as needed for severe pain or breakthrough pain. 02/18/20   Warden Fillers, MD  Prenatal Vit-Fe Fumarate-FA (MULTIVITAMIN-PRENATAL) 27-0.8 MG TABS tablet Take 1 tablet by mouth daily. 01/30/20   [provider]     Objective:  EXAM:   Vitals:   11/06/20 1002  BP: 118/88  Pulse: 89  Resp: 16  Temp: 98.1 F (36.7 C)  SpO2: 98%  Weight: 134 lb 6.4 oz (61 kg)  Height: 5' (1.524 m)    General appearance : A&OX3. NAD. Non-toxic-appearing HEENT: Atraumatic and Normocephalic.  PERRLA. EOM intact.  TM clear B. Mouth-MMM, post pharynx with minimal erythema, mild PND. Neck: supple, no JVD. No cervical lymphadenopathy. 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   Psych:  TP linear. J/I WNL. Normal speech. Appropriate eye contact and affect.  Skin:  No Rash  Data Review Lab Results  Component Value Date   HGBA1C 4.9 08/23/2019     Assessment & Plan   1. Pharyngitis, unspecified etiology Salt water gargles tid;  continue ibu or APAP for discomfort. - POCT rapid strep A - Mononucleosis Test, Qual W/ Reflex - CMV IgM - Culture, Group A Strep - Ambulatory referral to ENT - CBC with Differential/Platelet  2. 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  Will defer to general surgery - Ambulatory  referral to General Surgery Vs. 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  The patient was given clear instructions to go to ER or return to medical center if symptoms don't improve, worsen or new problems develop. The patient verbalized understanding. The patient was told to call to get lab results if they haven't heard anything in the next week.     Georgian Co, PA-C [facility] and [facility] [place], [place] [phone]   11/06/2020, 10:42 AM

## 2020-11-06 NOTE — Patient Instructions (Signed)

## 2020-11-07 LAB — CBC WITH DIFFERENTIAL/PLATELET
Basophils Absolute: 0 10*3/uL (ref 0.0–0.2)
Basos: 0 %
EOS (ABSOLUTE): 0.1 10*3/uL (ref 0.0–0.4)
Eos: 1 %
Hematocrit: 48 % — ABNORMAL HIGH (ref 34.0–46.6)
Hemoglobin: 15.6 g/dL (ref 11.1–15.9)
Immature Grans (Abs): 0 10*3/uL (ref 0.0–0.1)
Immature Granulocytes: 0 %
Lymphocytes Absolute: 1.3 10*3/uL (ref 0.7–3.1)
Lymphs: 17 %
MCH: 30.1 pg (ref 26.6–33.0)
MCHC: 32.5 g/dL (ref 31.5–35.7)
MCV: 93 fL (ref 79–97)
Monocytes Absolute: 0.8 10*3/uL (ref 0.1–0.9)
Monocytes: 10 %
Neutrophils Absolute: 5.8 10*3/uL (ref 1.4–7.0)
Neutrophils: 72 %
Platelets: 227 10*3/uL (ref 150–450)
RBC: 5.19 x10E6/uL (ref 3.77–5.28)
RDW: 11.9 % (ref 11.7–15.4)
WBC: 8.1 10*3/uL (ref 3.4–10.8)

## 2020-11-07 LAB — MONO QUAL W/RFLX QN: Mono Qual W/Rflx Qn: NEGATIVE

## 2020-11-07 LAB — CMV IGM: CMV IgM Ser EIA-aCnc: <30 AU/mL (ref 0.0–29.9)

## 2020-11-09 LAB — CULTURE, GROUP A STREP: Strep A Culture: NEGATIVE

## 2021-02-24 ENCOUNTER — Other Ambulatory Visit: Payer: Self-pay

## 2021-02-24 ENCOUNTER — Encounter: Payer: Self-pay | Admitting: Nurse Practitioner

## 2021-02-24 ENCOUNTER — Ambulatory Visit (HOSPITAL_BASED_OUTPATIENT_CLINIC_OR_DEPARTMENT_OTHER): Payer: Medicaid Other | Admitting: Nurse Practitioner

## 2021-02-24 DIAGNOSIS — G5601 Carpal tunnel syndrome, right upper limb: Secondary | ICD-10-CM | POA: Diagnosis not present

## 2021-02-24 MED ORDER — GABAPENTIN 600 MG PO TABS: 300.0000 mg | ORAL_TABLET | Freq: Every day | ORAL | 1 refills | Status: DC

## 2021-02-24 NOTE — Progress Notes (Signed)
Virtual Visit via Telephone Note Due to national recommendations of social distancing due to COVID 19, telehealth visit is felt to be most appropriate for this patient at this time.  I discussed the limitations, risks, security and privacy concerns of performing an evaluation and management service by telephone and the availability of in person appointments. I also discussed with the patient that there may be a patient responsible charge related to this service. The patient expressed understanding and agreed to proceed.    I connected with Shirley Baker on 02/24/21  at  11:10 AM EDT  EDT by telephone and verified that I am speaking with the correct person using two identifiers.  Location of Patient: Private Residence   Location of Provider: [facility]    Persons participating in Telemedicine visit: [name] FNP-BC Sindhu Winslow    History of Present Illness: Telemedicine visit for: Right hand pain.   Carpal Tunnel Syndrome: Patient presents for presents evaluation of possible carpal tunnel syndrome.  Onset of the symptoms was 2 months ago. Current symptoms include  pain and numbness in the right wrist, hand and fingers with some numbness of right arm interittently . Inciting event/aggravating factors: none known Patient's course of EA:[id number] worsening. Evaluation to date: none.  Treatment to date: none.    Past Medical History:  Diagnosis Date   Anemia    Delivery by classical cesarean section 02/10/2020   J incision on uterus due to fetal malpresentation and difficulty with delivery NEEDS DELIVERY AT 36-37 WEEKS FOR SUBSEQUENT PREGNANCIES   Heart murmur    Ovarian cyst    Syncope     Past Surgical History:  Procedure Laterality Date   CESAREAN SECTION N/A 02/10/2020   Procedure: CESAREAN SECTION;  Surgeon: Tereso Newcomer, MD;  Location: [facility] LD [facility]S;  Service: Obstetrics;  Laterality: N/A;   NO PAST SURGERIES      Family History  Problem  Relation Age of Onset   Healthy Mother    Healthy Father    Hearing loss Neg Hx     Social History   Socioeconomic History   Marital status: Single    Spouse name: Not on file   Number of children: 2   Years of education: Not on file   Highest education level: Associate degree: occupational, Scientist, product/process development, or vocational program  Occupational History   Not on file  Tobacco Use   Smoking status: Never   Smokeless tobacco: Never   Tobacco comments:    hookah, last Aug 2019- patient denies 08/16/2019  Vaping Use   Vaping Use: Never used  Substance and Sexual Activity   Alcohol use: No   Drug use: No   Sexual activity: Yes    Birth control/protection: None  Other Topics Concern   Not on file  Social History Narrative   Not on file   Social Determinants of Health   Financial Resource Strain: Not on file  Food Insecurity: Not on file  Transportation Needs: Not on file  Physical Activity: Not on file  Stress: Not on file  Social Connections: Not on file     Observations/Objective: Awake, alert and oriented x 3   Review of Systems  Constitutional:  Negative for fever, malaise/fatigue and weight loss.  HENT: Negative.  Negative for nosebleeds.   Eyes: Negative.  Negative for blurred vision, double vision and photophobia.  Respiratory: Negative.  Negative for cough and shortness of breath.   Cardiovascular: Negative.  Negative for chest pain, palpitations and  leg swelling.  Gastrointestinal: Negative.  Negative for heartburn, nausea and vomiting.  Musculoskeletal:  Positive for joint pain. Negative for myalgias.  Neurological:  Positive for tingling and sensory change. Negative for dizziness, focal weakness, seizures and headaches.  Psychiatric/Behavioral: Negative.  Negative for suicidal ideas.    Assessment and Plan: Diagnoses and all orders for this visit:  Carpal tunnel syndrome on right -     gabapentin (NEURONTIN) 600 MG tablet; Take 0.5 tablets (300 mg total) by  mouth at bedtime.    Follow Up Instructions Return in about 4 weeks (around 03/24/2021) for carpal tunnel symptoms .     I discussed the assessment and treatment plan with the patient. The patient was provided an opportunity to ask questions and all were answered. The patient agreed with the plan and demonstrated an understanding of the instructions.   The patient was advised to call back or seek an in-person evaluation if the symptoms worsen or if the condition fails to improve as anticipated.  I provided 10 minutes of non-face-to-face time during this encounter including median intraservice time, reviewing previous notes, labs, imaging, medications and explaining diagnosis and management.  Claiborne Rigg, FNP-BC

## 2021-03-22 ENCOUNTER — Emergency Department (HOSPITAL_COMMUNITY)
Admission: EM | Admit: 2021-03-22 | Discharge: 2021-03-23 | Disposition: A | Discharge status: Home / Self Care | Payer: Medicaid Other | Attending: Emergency Medicine | Admitting: Emergency Medicine

## 2021-03-22 DIAGNOSIS — R0789 Other chest pain: Secondary | ICD-10-CM | POA: Insufficient documentation

## 2021-03-22 DIAGNOSIS — Z5321 Procedure and treatment not carried out due to patient leaving prior to being seen by health care provider: Secondary | ICD-10-CM | POA: Insufficient documentation

## 2021-03-22 DIAGNOSIS — R234 Changes in skin texture: Secondary | ICD-10-CM | POA: Insufficient documentation

## 2021-03-22 DIAGNOSIS — S60941A Unspecified superficial injury of left index finger, initial encounter: Secondary | ICD-10-CM | POA: Diagnosis not present

## 2021-03-22 DIAGNOSIS — R079 Chest pain, unspecified: Secondary | ICD-10-CM | POA: Diagnosis not present

## 2021-03-22 DIAGNOSIS — L301 Dyshidrosis [pompholyx]: Secondary | ICD-10-CM | POA: Insufficient documentation

## 2021-03-23 ENCOUNTER — Ambulatory Visit: Payer: Self-pay | Admitting: *Deleted

## 2021-03-23 ENCOUNTER — Encounter (HOSPITAL_COMMUNITY): Payer: Self-pay | Admitting: Emergency Medicine

## 2021-03-23 ENCOUNTER — Emergency Department (HOSPITAL_COMMUNITY): Payer: Medicaid Other

## 2021-03-23 ENCOUNTER — Other Ambulatory Visit: Payer: Self-pay

## 2021-03-23 DIAGNOSIS — R079 Chest pain, unspecified: Secondary | ICD-10-CM | POA: Diagnosis not present

## 2021-03-23 LAB — BASIC METABOLIC PANEL
Anion gap: 6 (ref 5–15)
BUN: 17 mg/dL (ref 6–20)
CO2: 22 mmol/L (ref 22–32)
Calcium: 8.9 mg/dL (ref 8.9–10.3)
Chloride: 108 mmol/L (ref 98–111)
Creatinine, Ser: 1.11 mg/dL — ABNORMAL HIGH (ref 0.44–1.00)
GFR, Estimated: >60 mL/min (ref >60)
Glucose, Bld: 97 mg/dL (ref 70–99)
Potassium: 4 mmol/L (ref 3.5–5.1)
Sodium: 136 mmol/L (ref 135–145)

## 2021-03-23 LAB — TROPONIN I (HIGH SENSITIVITY): Troponin I (High Sensitivity): <2 ng/L (ref <18)

## 2021-03-23 LAB — CBC
HCT: 40.8 % (ref 36.0–46.0)
Hemoglobin: 13.8 g/dL (ref 12.0–15.0)
MCH: 30.3 pg (ref 26.0–34.0)
MCHC: 33.8 g/dL (ref 30.0–36.0)
MCV: 89.7 fL (ref 80.0–100.0)
Platelets: 231 10*3/uL (ref 150–400)
RBC: 4.55 MIL/uL (ref 3.87–5.11)
RDW: 12.6 % (ref 11.5–15.5)
WBC: 6.3 10*3/uL (ref 4.0–10.5)
nRBC: 0 % (ref 0.0–0.2)

## 2021-03-23 NOTE — Telephone Encounter (Signed)
Bumpy rash on right pointer finger about one week. Dries, cracks and get sore to touch or when ointment applied. No fever no drainage. Advised to keep clean and dry , open to air unless it begins to drain. Appointment scheduled.   Reason for Disposition  [1] Applying cream or ointment AND [2] causes severe itch, burning or pain  Answer Assessment - Initial Assessment Questions 1. Bumpy rash on right pointer finger is cracking 2. LOCATION: "Where is the rash located?"      Right pointer 3. NUMBER: "How many spots are there?"      several 4. SIZE: "How big are the spots?" (Inches, centimeters or compare to size of a coin)      small 5. ONSET: "When did the rash start?"      About one week ago 6. ITCHING: "Does the rash itch?" If Yes, ask: "How bad is the itch?"  (Scale 0-10; or none, mild, moderate, severe)     No but ointment or touching causes pain 7. PAIN: "Does the rash hurt?" If Yes, ask: "How bad is the pain?"  (Scale 0-10; or none, mild, moderate, severe)    - NONE (0): no pain    - MILD (1-3): doesn't interfere with normal activities     - MODERATE (4-7): interferes with normal activities or awakens from sleep     - SEVERE (8-10): excruciating pain, unable to do any normal activities     Mild  8. OTHER SYMPTOMS: "Do you have any other symptoms?" (e.g., fever)     none 9. PREGNANCY: "Is there any chance you are pregnant?" "When was your last menstrual period?"     Did not ask  Protocols used: Rash or Redness - Localized-A-AH

## 2021-03-23 NOTE — ED Notes (Signed)
Called pt x3 for room, no response. 

## 2021-03-23 NOTE — ED Provider Notes (Signed)
Emergency Medicine Provider Triage Evaluation Note  Shirley Baker , a 30 y.o. female  was evaluated in triage.  Pt complains of left sided chest pain, constant for 1 week.  No aggravating or alleviating factors.  Denies URI symptoms or cough.  No fever, sob.  Pt denies recent travel, leg swelling, OCP (does have an IUD), lupus.  No previous hx of cardiac problems.  Pt also c/o pain and swelling to the right pointer finger.  She reports symptoms started several days ago.  Her first symptoms was a bumpy rash.  She denies previous.   Review of Systems  Positive: Chest pain, finger injury Negative: Fever, chills  Physical Exam  BP 128/82 (BP Location: Left Arm)   Pulse 70   Temp 98.7 F (37.1 C)   Resp 16   SpO2 99%  Gen:   Awake, no distress   Resp:  Normal effort  MSK:   Moves extremities without difficulty  Other:  Rash, and fissures and skin thickening of the right pointer finger.  See photo below.       Medical Decision Making  Medically screening exam initiated at 12:10 AM.  Appropriate orders placed.  Hermelinda Diegel was informed that the remainder of the evaluation will be completed by another provider, this initial triage assessment does not replace that evaluation, and the importance of remaining in the ED until their evaluation is complete.  Chest pain.  Dyshidrotic eczema now with fissure.   Erian Rosengren, Boyd Kerbs 03/23/21 9628    Geoffery Lyons, MD 03/23/21 [phone]

## 2021-03-24 ENCOUNTER — Telehealth: Payer: Self-pay

## 2021-03-24 NOTE — Telephone Encounter (Signed)
Transition Care Management Unsuccessful Follow-up Telephone Call  Date of discharge and from where:  03/23/2021-[facility]   Attempts:  1st Attempt  Reason for unsuccessful TCM follow-up call:  Left voice message    

## 2021-03-24 NOTE — Telephone Encounter (Signed)
Transition Care Management Unsuccessful Follow-up Telephone Call  Date of discharge and from where:  03/23/2021-[facility]  Attempts:  2nd Attempt  Reason for unsuccessful TCM follow-up call:  Left voice message

## 2021-03-25 ENCOUNTER — Ambulatory Visit: Payer: Medicaid Other | Attending: Physician Assistant | Admitting: Physician Assistant

## 2021-03-25 ENCOUNTER — Encounter: Payer: Self-pay | Admitting: Physician Assistant

## 2021-03-25 ENCOUNTER — Other Ambulatory Visit: Payer: Self-pay

## 2021-03-25 ENCOUNTER — Ambulatory Visit (HOSPITAL_COMMUNITY)
Admission: RE | Admit: 2021-03-25 | Discharge: 2021-03-25 | Disposition: A | Discharge status: Home / Self Care | Payer: Medicaid Other | Source: Ambulatory Visit | Attending: Physician Assistant | Admitting: Physician Assistant

## 2021-03-25 VITALS — BP 114/78 | HR 70 | Resp 16 | Wt 135.4 lb

## 2021-03-25 DIAGNOSIS — R5383 Other fatigue: Secondary | ICD-10-CM | POA: Diagnosis not present

## 2021-03-25 DIAGNOSIS — R21 Rash and other nonspecific skin eruption: Secondary | ICD-10-CM

## 2021-03-25 DIAGNOSIS — R001 Bradycardia, unspecified: Secondary | ICD-10-CM

## 2021-03-25 DIAGNOSIS — M79644 Pain in right finger(s): Secondary | ICD-10-CM | POA: Diagnosis not present

## 2021-03-25 DIAGNOSIS — M79641 Pain in right hand: Secondary | ICD-10-CM | POA: Diagnosis not present

## 2021-03-25 DIAGNOSIS — Z09 Encounter for follow-up examination after completed treatment for conditions other than malignant neoplasm: Secondary | ICD-10-CM

## 2021-03-25 MED ORDER — TRIAMCINOLONE ACETONIDE 0.1 % EX CREA: 1.0000 "application " | TOPICAL_CREAM | Freq: Two times a day (BID) | CUTANEOUS | 0 refills | Status: DC

## 2021-03-25 MED ORDER — CEPHALEXIN 500 MG PO CAPS: 500.0000 mg | ORAL_CAPSULE | Freq: Three times a day (TID) | ORAL | 0 refills | Status: DC

## 2021-03-25 NOTE — Progress Notes (Signed)
Shirley Baker, is a 30 y.o. female  LKT:[id number]  DSK:[id number]  DOB - 03-19-1991  Chief Complaint  Patient presents with   Rash       Subjective:   Shirley Baker is a 30 y.o. female here today for a follow up visit after being seen in the ED 10/23 for finger pain and rash and CP.  CP has resolved.  Cardiac work up neg except sinus brady cardia. Nothing was provided for her finger.  She started noticing pain in her finger about 1 week ago.  NKI.  The joint aches and there is a rash on her finger.  No fever.    She c/o generalized fatigue for months now.     Patient has No headache, No chest pain, No abdominal pain - No Nausea, No new weakness tingling or numbness, No Cough - SOB.  No problems updated.  ALLERGIES: No Known Allergies  PAST MEDICAL HISTORY: Past Medical History:  Diagnosis Date   Anemia    Delivery by classical cesarean section 02/10/2020   J incision on uterus due to fetal malpresentation and difficulty with delivery NEEDS DELIVERY AT 36-37 WEEKS FOR SUBSEQUENT PREGNANCIES   Heart murmur    Ovarian cyst    Syncope     MEDICATIONS AT HOME: Prior to Admission medications   Medication Sig Start Date End Date Taking? Authorizing Provider  cephALEXin (KEFLEX) 500 MG capsule Take 1 capsule (500 mg total) by mouth 3 (three) times daily. 03/25/21  Yes Georgian Co M, PA-C  dicyclomine (BENTYL) 20 MG tablet Take 1 tablet (20 mg total) by mouth 2 (two) times daily. 07/28/20  Yes Palumbo, April, MD  ferrous sulfate 325 (65 FE) MG tablet Take 1 tablet (325 mg total) by mouth daily with breakfast. 02/13/20  Yes Hermina Staggers, MD  gabapentin (NEURONTIN) 600 MG tablet Take 0.5 tablets (300 mg total) by mouth at bedtime. 02/24/21 03/26/21 Yes Claiborne Rigg, NP  ibuprofen (ADVIL) 600 MG tablet Take 1 tablet (600 mg total) by mouth every 6 (six) hours. 02/13/20  Yes Hermina Staggers, MD  oxyCODONE (OXY IR/ROXICODONE) 5 MG immediate release tablet Take 1 tablet (5 mg  total) by mouth every 4 (four) hours as needed for severe pain or breakthrough pain. 02/18/20  Yes Warden Fillers, MD  Prenatal Vit-Fe Fumarate-FA (MULTIVITAMIN-PRENATAL) 27-0.8 MG TABS tablet Take 1 tablet by mouth daily. 01/30/20  Yes [provider]  triamcinolone cream (KENALOG) 0.1 % Apply 1 application topically 2 (two) times daily. 03/25/21  Yes Cydni Reddoch, Marzella Schlein, PA-C    ROS: Neg HEENT Neg resp Neg cardiac Neg GI Neg GU Neg psych Neg neuro  Objective:   Vitals:   03/25/21 1119  BP: 114/78  Pulse: 70  Resp: 16  SpO2: 98%  Weight: 135 lb 6.4 oz (61.4 kg)   Exam General appearance : Awake, alert, not in any distress. Speech Clear. Not toxic looking HEENT: Atraumatic and Normocephalic Neck: Supple, no JVD. No cervical lymphadenopathy.  Chest: Good air entry bilaterally, CTAB.  No rales/rhonchi/wheezing CVS: S1 S2 regular, no murmurs. (Rate of 72 on exam) Extremities: B/L Lower Ext shows no edema, both legs are warm to touch Neurology: Awake alert, and oriented X 3, CN II-XII intact, Non focal Skin: 1st digit R hand between the DIP and tip of finger there is a rash with skin splitting without obvious infection.  Full S&ROM of finger.  No streaking into hand or arm  Data Review Lab Results  Component  Value Date   HGBA1C 4.9 08/23/2019    Assessment & Plan   1. Other fatigue - Thyroid Panel With TSH - Vitamin D, 25-hydroxy  2. Sinus bradycardia EKG otherwise normal and pulse normal today.  Likely physiologic and normal - Thyroid Panel With TSH - Vitamin D, 25-hydroxy  3. Encounter for examination following treatment at hospital  4. Rash of finger - cephALEXin (KEFLEX) 500 MG capsule; Take 1 capsule (500 mg total) by mouth 3 (three) times daily.  Dispense: 30 capsule; Refill: 0 - triamcinolone cream (KENALOG) 0.1 %; Apply 1 application topically 2 (two) times daily.  Dispense: 30 g; Refill: 0 - Ambulatory referral to Hand Surgery - DG Hand Complete  Right; Future  5. Finger pain, right R/O osteo - DG Hand Complete Right; Future    Patient have been counseled extensively about nutrition and exercise. Other issues discussed during this visit include: low cholesterol diet, weight control and daily exercise, foot care, annual eye examinations at Ophthalmology, importance of adherence with medications and regular follow-up. We also discussed long term complications of uncontrolled diabetes and hypertension.   Return if symptoms worsen or fail to improve.  The patient was given clear instructions to go to ER or return to medical center if symptoms don't improve, worsen or new problems develop. The patient verbalized understanding. The patient was told to call to get lab results if they haven't heard anything in the next week.      Georgian Co, PA-C [facility] [place], [place] [phone]   03/25/2021, 2:06 PM Patient ID: Shirley Baker, female   DOB: [birth year], 30 y.o.   MRN: [id number]

## 2021-03-25 NOTE — Telephone Encounter (Signed)
Transition Care Management Unsuccessful Follow-up Telephone Call  Date of discharge and from where:  03/23/2021-[facility]  Attempts:  3rd Attempt  Reason for unsuccessful TCM follow-up call:  Left voice message

## 2021-03-26 LAB — THYROID PANEL WITH TSH
Free Thyroxine Index: 2.1 (ref 1.2–4.9)
T3 Uptake Ratio: 29 % (ref 24–39)
T4, Total: 7.2 ug/dL (ref 4.5–12.0)
TSH: 0.923 u[IU]/mL (ref 0.450–4.500)

## 2021-03-26 LAB — VITAMIN D 25 HYDROXY (VIT D DEFICIENCY, FRACTURES): Vit D, 25-Hydroxy: 28 ng/mL — ABNORMAL LOW (ref 30.0–100.0)

## 2021-04-03 ENCOUNTER — Ambulatory Visit: Payer: Medicaid Other | Admitting: Orthopedic Surgery

## 2021-04-10 ENCOUNTER — Telehealth (HOSPITAL_BASED_OUTPATIENT_CLINIC_OR_DEPARTMENT_OTHER): Payer: Medicaid Other | Admitting: Nurse Practitioner

## 2021-04-10 ENCOUNTER — Encounter: Payer: Self-pay | Admitting: Nurse Practitioner

## 2021-04-10 DIAGNOSIS — L03011 Cellulitis of right finger: Secondary | ICD-10-CM | POA: Diagnosis not present

## 2021-04-10 MED ORDER — MUPIROCIN 2 % EX OINT: 1.0000 "application " | TOPICAL_OINTMENT | Freq: Two times a day (BID) | CUTANEOUS | 0 refills | Status: DC

## 2021-04-10 NOTE — Progress Notes (Signed)
Virtual Visit Note Due to national recommendations of social distancing due to COVID 19, virtual visit is felt to be most appropriate for this patient at this time.  I discussed the limitations, risks, security and privacy concerns of performing an evaluation and management service by video and the availability of in person appointments. I also discussed with the patient that there may be a patient responsible charge related to this service. The patient expressed understanding and agreed to proceed.    I connected with Shirley Baker on 04/10/21  at   9:50 AM EST  EDT by VIDEO and verified that I am speaking with the correct person using two identifiers.   Location of Patient: Private Residence   Location of Provider: [facility]    Persons participating in VIRTUAL visit: [name] FNP-BC Raelie Woolsey    History of Present Illness: VIRTUAL visit for: Paronychia  She has what appears to be right index finger paronychia. Endorses pain, swelling and discoloration of the finger. She missed her appointment with the hand specialist and will need to be referred again. Was prescribed po keflex and triamcinolone by another provider which she reports as ineffective. She denies any injury or trauma and states the nail of the affected finger is starting to lift from the nail bed.     Past Medical History:  Diagnosis Date   Anemia    Delivery by classical cesarean section 02/10/2020   J incision on uterus due to fetal malpresentation and difficulty with delivery NEEDS DELIVERY AT 36-37 WEEKS FOR SUBSEQUENT PREGNANCIES   Heart murmur    Ovarian cyst    Syncope     Past Surgical History:  Procedure Laterality Date   CESAREAN SECTION N/A 02/10/2020   Procedure: CESAREAN SECTION;  Surgeon: Tereso Newcomer, MD;  Location: [facility] LD [facility];  Service: Obstetrics;  Laterality: N/A;   NO PAST SURGERIES      Family History  Problem Relation Age of Onset   Healthy Mother     Healthy Father    Hearing loss Neg Hx     Social History   Socioeconomic History   Marital status: Single    Spouse name: Not on file   Number of children: 2   Years of education: Not on file   Highest education level: Associate degree: occupational, Scientist, product/process development, or vocational program  Occupational History   Not on file  Tobacco Use   Smoking status: Never   Smokeless tobacco: Never   Tobacco comments:    hookah, last Aug 2019- patient denies 08/16/2019  Vaping Use   Vaping Use: Never used  Substance and Sexual Activity   Alcohol use: No   Drug use: No   Sexual activity: Yes    Birth control/protection: None  Other Topics Concern   Not on file  Social History Narrative   Not on file   Social Determinants of Health   Financial Resource Strain: Not on file  Food Insecurity: Not on file  Transportation Needs: Not on file  Physical Activity: Not on file  Stress: Not on file  Social Connections: Not on file     Observations/Objective: Awake, alert and oriented x 3   Review of Systems  Constitutional:  Negative for fever, malaise/fatigue and weight loss.  HENT: Negative.  Negative for nosebleeds.   Eyes: Negative.  Negative for blurred vision, double vision and photophobia.  Respiratory: Negative.  Negative for cough and shortness of breath.   Cardiovascular: Negative.  Negative for  chest pain, palpitations and leg swelling.  Gastrointestinal: Negative.  Negative for heartburn, nausea and vomiting.  Musculoskeletal:  Positive for joint pain. Negative for myalgias.  Skin:        See HPI  Neurological: Negative.  Negative for dizziness, focal weakness, seizures and headaches.  Psychiatric/Behavioral: Negative.  Negative for suicidal ideas.    Assessment and Plan: Diagnoses and all orders for this visit:  Paronychia of finger of right hand -     mupirocin ointment (BACTROBAN) 2 %; Apply 1 application topically 2 (two) times daily. -     Ambulatory referral to Hand  Surgery    Follow Up Instructions Return if symptoms worsen or fail to improve.     I discussed the assessment and treatment plan with the patient. The patient was provided an opportunity to ask questions and all were answered. The patient agreed with the plan and demonstrated an understanding of the instructions.   The patient was advised to call back or seek an in-person evaluation if the symptoms worsen or if the condition fails to improve as anticipated.  I provided 10 minutes of face-to-face time during this encounter including median intraservice time, reviewing previous notes, labs, imaging, medications and explaining diagnosis and management.  Claiborne Rigg, FNP-BC

## 2021-04-17 ENCOUNTER — Encounter: Payer: Self-pay | Admitting: Orthopedic Surgery

## 2021-04-17 ENCOUNTER — Ambulatory Visit (INDEPENDENT_AMBULATORY_CARE_PROVIDER_SITE_OTHER): Payer: Medicaid Other | Admitting: Orthopedic Surgery

## 2021-04-17 ENCOUNTER — Other Ambulatory Visit: Payer: Self-pay

## 2021-04-17 VITALS — BP 117/78 | HR 62 | Ht 61.0 in | Wt 135.0 lb

## 2021-04-17 DIAGNOSIS — R21 Rash and other nonspecific skin eruption: Secondary | ICD-10-CM | POA: Diagnosis not present

## 2021-04-17 NOTE — Progress Notes (Signed)
Office Visit Note   Patient: Shirley Baker           Date of Birth: 1990-11-14           MRN: [id number] Visit Date: 04/17/2021              Requested by: Anders Simmonds, PA-C [address] [place],  [place] [address] PCP: Claiborne Rigg, NP   Assessment & Plan: Visit Diagnoses:  1. Rash and other nonspecific skin eruption     Plan: Discussed with patient that this does not appear to be a paronychia.  She has no apparent swelling of the finger tip w/ no drainage from the eponychium or lateral nail fold.  Her issue started as a scaly rash which progressively became darker with skin fissuring.  There was no underlying trauma or injury.  She has failed both oral abx and cortisteroid cream prescribed by her PCP. Will plan on referring her to dermatology for their opinion.   Follow-Up Instructions: No follow-ups on file.   Orders:  Orders Placed This Encounter  Procedures   Ambulatory referral to Dermatology   No orders of the defined types were placed in this encounter.     Procedures: No procedures performed   Clinical Data: No additional findings.   Subjective: Chief Complaint  Patient presents with   Right Index Finger - Pain    NKI; + weakness, swelling, PAIN:7/10, worse at night, and very sharp, RIGHT Handed.    This is a 30 year old right-hand-dominant female who presents with a rash on her right index finger.  She notes that this started about a month ago with a scaly rash.  It progressively worsened since then.  Her skin has become increasingly darkened.  The scaling has increased and she has developed a transverse fissure across the fingertip.  It seems like the eponychium is pulling away from the underlying nail plate.  There is no drainage from under the hyponychium or lateral nail fold as would be expected in a paronychia.  The fingertip itself is not painful, swollen, or erythematous.  She denies any trauma or penetrating injury to the skin around her  fingertip.  She has no history of similar rashes or skin lesions.  She was seen by her primary care doctor who prescribed with an oral antibiotic and a corticosteroid cream.  Neither which have helped.   Review of Systems   Objective: Vital Signs: BP 117/78 (BP Location: Left Arm, Patient Position: Sitting)   Pulse 62   Ht 5\' 1"  (1.549 m)   Wt 135 lb (61.2 kg)   BMI 25.51 kg/m   Physical Exam Constitutional:      Appearance: Normal appearance.  Cardiovascular:     Rate and Rhythm: Normal rate.     Pulses: Normal pulses.  Skin:    General: Skin is warm and dry.     Capillary Refill: Capillary refill takes less than 2 seconds.     Findings: Rash present.  Neurological:     Mental Status: She is alert.    Right Hand Exam   Tenderness  Right hand tenderness location: TTP across dorsum of index fingertip in area of transverse laceration/skin fissure.  Other  Erythema: absent Sensation: normal Pulse: present  Comments:  Dark, scaly rash at dorsal aspect of index finger tip from about the PIP to the eponychium.  Transverse laceration/skin fissure at dorsal fingertip.  No drainage from eponychium or lateral nail fold.  Finger tip is not pain,  swollen, or erythematous. There are no changes to the nail plate.      Specialty Comments:  No specialty comments available.  Imaging: Previous imaging of the right hand taken 10/26 are reviewed and interpreted by me.  They do not demonstrate any bony lesions or findings suggestive of chronic infection.    PMFS History: Patient Active Problem List   Diagnosis Date Noted   Labor and delivery affected by malposition and malpresentation 02/10/2020   Postpartum hemorrhage 02/10/2020   Delivery by classical cesarean section 02/10/2020   Encounter for IUD insertion 02/10/2020   Transverse fetal lie, delivered, current hospitalization 02/10/2020   Supervision of high risk pregnancy, antepartum 08/16/2019   Short interval between  pregnancies affecting pregnancy, antepartum 08/16/2019   Sickle cell trait (HCC) 04/14/2018   History of low birth weight 04/14/2018   Past Medical History:  Diagnosis Date   Anemia    Delivery by classical cesarean section 02/10/2020   J incision on uterus due to fetal malpresentation and difficulty with delivery NEEDS DELIVERY AT 36-37 WEEKS FOR SUBSEQUENT PREGNANCIES   Heart murmur    Ovarian cyst    Syncope     Family History  Problem Relation Age of Onset   Healthy Mother    Healthy Father    Hearing loss Neg Hx     Past Surgical History:  Procedure Laterality Date   CESAREAN SECTION N/A 02/10/2020   Procedure: CESAREAN SECTION;  Surgeon: Tereso Newcomer, MD;  Location: [facility];  Service: Obstetrics;  Laterality: N/A;   NO PAST SURGERIES     Social History   Occupational History   Not on file  Tobacco Use   Smoking status: Never   Smokeless tobacco: Never   Tobacco comments:    hookah, last Aug 2019- patient denies 08/16/2019  Vaping Use   Vaping Use: Never used  Substance and Sexual Activity   Alcohol use: No   Drug use: No   Sexual activity: Yes    Birth control/protection: None

## 2021-04-23 ENCOUNTER — Other Ambulatory Visit: Payer: Self-pay | Admitting: Nurse Practitioner

## 2021-04-23 DIAGNOSIS — G5601 Carpal tunnel syndrome, right upper limb: Secondary | ICD-10-CM

## 2021-04-24 NOTE — Telephone Encounter (Signed)
Requested Prescriptions  Pending Prescriptions Disp Refills  . gabapentin (NEURONTIN) 600 MG tablet [Pharmacy Med Name: GABAPENTIN 600MG  TABLETS] 15 tablet 1    Sig: TAKE 1/2 TABLET(300 MG) BY MOUTH AT BEDTIME     Neurology: Anticonvulsants - gabapentin Passed - 04/23/2021 11:14 AM      Passed - Valid encounter within last 12 months    Recent Outpatient Visits          2 weeks ago Paronychia of finger of right hand   [facility] [place], [place], NP   1 month ago Other fatigue   [facility] [place], [place], [place]   1 month ago Carpal tunnel syndrome on right   [facility] [place], [place], NP   5 months ago Pharyngitis, unspecified etiology   [facility] [place], [place], [place]   2 years ago Chronic migraine without aura without status migrainosus, not intractable   [facility] [place], [place], NP

## 2021-04-29 ENCOUNTER — Ambulatory Visit: Payer: Self-pay

## 2021-04-29 NOTE — Telephone Encounter (Signed)
Pt called to get a referral to a dermatologist for her right pointer finger/ she states it it cracked and bleeding an painful to her shoulder / she says it looks like an infection but is not / she has been seen for it and given medication that has not helped and has seen a hand specialist that was not able to figure out the issue  / referral request was sent to office /please advise   Pt. Requesting referral to dermatology for right finger rash. Orthopedics has placed referral as well. Please advise pt.   Answer Assessment - Initial Assessment Questions 1. REASON FOR CALL or QUESTION: "What is your reason for calling today?" or "How can I best help you?" or "What question do you have that I can help answer?"     Requesting referral to dermatology  2. CALLER: Document the source of call. (e.g., laboratory, patient).     Patient  Protocols used: PCP Call - No Triage-A-AH

## 2021-04-29 NOTE — Telephone Encounter (Signed)
Patient already has a referral that was made to dermatology by ortho. The referral was made on the 21st. Please see referral tab. Thanks!!!

## 2021-05-04 NOTE — Telephone Encounter (Signed)
Reason for Disposition  . [1] Other NON-URGENT information for PCP AND [2] does not require PCP response    Protocols used: PCP Call - No Triage-A-AH

## 2021-05-04 NOTE — Telephone Encounter (Signed)
Pt called back and stated that dermatologist she was referred to does not take Medicaid.  Advised pt to look on the back of her Medicaid card and call the number for patients. Advised to get several names and numbers and call our office tomorrow. Pt verbalized understanding.

## 2021-05-06 NOTE — Telephone Encounter (Signed)
She will need to find out who is in network with her insurance and let [facility] know

## 2021-05-07 NOTE — Telephone Encounter (Signed)
Called pt stated nobody is taking her insurance and she will probably have to by a secondary one or self pay

## 2021-06-04 ENCOUNTER — Telehealth: Payer: Self-pay | Admitting: *Deleted

## 2021-06-04 NOTE — Telephone Encounter (Signed)
[facility] stating they do not accept pt insurance and no one in the [place] area will accept it, pt will have to go to [facility] to one of the Derms there.   I called pt to see if ok with going to [facility] to [facility] and she is ok. Order will be sent to someone in that area.

## 2021-06-08 NOTE — Telephone Encounter (Signed)
Spoke with Shirley [facility] at [facility] [place] and she is going to fax the order to the [facility] location since they take her insurance there.

## 2021-11-06 ENCOUNTER — Telehealth: Payer: Self-pay | Admitting: Nurse Practitioner

## 2021-11-06 NOTE — Telephone Encounter (Signed)
Copied from CRM [id number]. Topic: Referral - Request for Referral >> Nov 06, 2021 10:44 AM Faith T wrote: Has patient seen PCP for this complaint? No.  Referral for which specialty:  breast surgeon  [facility]  Reason for referral: Breast pain-around nipple

## 2021-11-11 NOTE — Telephone Encounter (Signed)
Pt was called and scheduled an appointment.

## 2021-12-14 ENCOUNTER — Ambulatory Visit: Payer: Medicaid Other | Admitting: Nurse Practitioner

## 2022-02-15 NOTE — Progress Notes (Deleted)
   Established Patient Office Visit  Subjective   Patient ID: Shirley Baker, female    DOB: 06/04/1990  Age: 31 y.o. MRN: [id number]  No chief complaint on file.   HPI  {History (Optional):23778}  ROS    Objective:     There were no vitals taken for this visit. {Vitals History (Optional):23777}  Physical Exam   No results found for any visits on 02/16/22.  {Labs (Optional):23779}  The ASCVD Risk score (Arnett DK, et al., 2019) failed to calculate for the following reasons:   The 2019 ASCVD risk score is only valid for ages 57 to 5    Assessment & Plan:   Problem List Items Addressed This Visit   None   No follow-ups on file.    Asencion Noble, MD

## 2022-02-16 ENCOUNTER — Ambulatory Visit: Payer: Medicaid Other | Attending: Nurse Practitioner | Admitting: Critical Care Medicine

## 2022-02-16 ENCOUNTER — Encounter: Payer: Self-pay | Admitting: Critical Care Medicine

## 2022-02-16 VITALS — BP 116/79 | HR 78 | Ht 60.0 in | Wt 134.6 lb

## 2022-02-16 DIAGNOSIS — E559 Vitamin D deficiency, unspecified: Secondary | ICD-10-CM | POA: Diagnosis not present

## 2022-02-16 DIAGNOSIS — G629 Polyneuropathy, unspecified: Secondary | ICD-10-CM | POA: Diagnosis not present

## 2022-02-16 DIAGNOSIS — L710 Perioral dermatitis: Secondary | ICD-10-CM | POA: Insufficient documentation

## 2022-02-16 DIAGNOSIS — N644 Mastodynia: Secondary | ICD-10-CM

## 2022-02-16 MED ORDER — ISOTRETINOIN 30 MG PO CAPS: 30.0000 mg | ORAL_CAPSULE | Freq: Two times a day (BID) | ORAL | 0 refills | Status: AC

## 2022-02-16 NOTE — Assessment & Plan Note (Signed)
Resistant to treatment will give oral Accutane and likely will need dermatology referral

## 2022-02-16 NOTE — Assessment & Plan Note (Signed)
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

## 2022-02-16 NOTE — Patient Instructions (Signed)
30-day supply of Accutane sent to pharmacy however if unimproved will need to refer to dermatology  Complete set of screening labs obtained at this visit and also laboratory testing for neuropathy obtained  Return to see your primary care provider in 6 weeks for follow-up  Breast diagnostic study obtained at the breast center as well

## 2022-02-16 NOTE — Progress Notes (Signed)
Acute Office Visit  Subjective:     Patient ID: Shirley Baker, female    DOB: 08/26/1990, 31 y.o.   MRN: [id number]  Chief complaint of breast pain and numbness in arms and legs  This is a 31 year old female primary care patient of Ms. Raul Del seen as a work in today for perioral acne dermatitis and bilateral breast pain.  She called in June with the breast pain was not not able to get in to be seen at that time.  Note she declines a flu vaccine at this visit.  Patient has perioral dermatitis she has used BenzaClin ointment in the past without improvement.  She is taking gabapentin for the arm pain and arm numbness and pain in the arms and feet without improvement.  She is never been evaluated for neuropathy.  Patient is currently not breast-feeding she has 4 children the youngest 33 is 2.  She is not currently pregnant.    Review of Systems  Constitutional:  Negative for chills, diaphoresis, fever, malaise/fatigue and weight loss.  HENT:  Negative for congestion, hearing loss, nosebleeds, sore throat and tinnitus.   Eyes:  Negative for blurred vision, photophobia and redness.  Respiratory:  Negative for cough, hemoptysis, sputum production, shortness of breath, wheezing and stridor.   Cardiovascular:  Negative for chest pain, palpitations, orthopnea, claudication, leg swelling and PND.       Breast pain  Gastrointestinal:  Negative for abdominal pain, blood in stool, constipation, diarrhea, heartburn, nausea and vomiting.  Genitourinary:  Negative for dysuria, flank pain, frequency, hematuria and urgency.  Musculoskeletal:  Negative for back pain, falls, joint pain, myalgias and neck pain.  Skin:  Negative for itching and rash.  Neurological:  Positive for tingling. Negative for dizziness, tremors, sensory change, speech change, focal weakness, seizures, loss of consciousness, weakness and headaches.  Endo/Heme/Allergies:  Negative for environmental allergies and polydipsia. Does not  bruise/bleed easily.  Psychiatric/Behavioral:  Negative for depression, memory loss, substance abuse and suicidal ideas. The patient is not nervous/anxious and does not have insomnia.         Objective:    BP 116/79   Pulse 78   Ht 5' (1.524 m)   Wt 134 lb 9.6 oz (61.1 kg)   SpO2 96%   BMI 26.29 kg/m    Physical Exam Vitals reviewed.  Constitutional:      Appearance: Normal appearance. She is well-developed. She is not diaphoretic.  HENT:     Head: Normocephalic and atraumatic.     Nose: No nasal deformity, septal deviation, mucosal edema or rhinorrhea.     Right Sinus: No maxillary sinus tenderness or frontal sinus tenderness.     Left Sinus: No maxillary sinus tenderness or frontal sinus tenderness.     Mouth/Throat:     Pharynx: No oropharyngeal exudate.  Eyes:     General: No scleral icterus.    Conjunctiva/sclera: Conjunctivae normal.     Pupils: Pupils are equal, round, and reactive to light.  Neck:     Thyroid: No thyromegaly.     Vascular: No carotid bruit or JVD.     Trachea: Trachea normal. No tracheal tenderness or tracheal deviation.  Cardiovascular:     Rate and Rhythm: Normal rate and regular rhythm.     Chest Wall: PMI is not displaced.     Pulses: Normal pulses. No decreased pulses.     Heart sounds: Normal heart sounds, S1 normal and S2 normal. Heart sounds not distant. No murmur heard.  No systolic murmur is present.     No diastolic murmur is present.     No friction rub. No gallop. No S3 or S4 sounds.  Pulmonary:     Effort: No tachypnea, accessory muscle usage or respiratory distress.     Breath sounds: No stridor. No decreased breath sounds, wheezing, rhonchi or rales.  Chest:     Chest wall: No tenderness.  Breasts:    Breasts are symmetrical.     Right: Normal. No swelling, bleeding, inverted nipple, mass, nipple discharge, skin change or tenderness.     Left: Normal. No swelling, bleeding, inverted nipple, mass, nipple discharge, skin  change or tenderness.     Comments: Breast nontender Abdominal:     General: Bowel sounds are normal. There is no distension.     Palpations: Abdomen is soft. Abdomen is not rigid.     Tenderness: There is no abdominal tenderness. There is no guarding or rebound.  Musculoskeletal:        General: Normal range of motion.     Cervical back: Normal range of motion and neck supple. No edema, erythema or rigidity. No muscular tenderness. Normal range of motion.  Lymphadenopathy:     Head:     Right side of head: No submental or submandibular adenopathy.     Left side of head: No submental or submandibular adenopathy.     Cervical: No cervical adenopathy.     Upper Body:     Right upper body: No supraclavicular, axillary or pectoral adenopathy.     Left upper body: No supraclavicular, axillary or pectoral adenopathy.  Skin:    General: Skin is warm and dry.     Coloration: Skin is not pale.     Findings: No rash.     Nails: There is no clubbing.  Neurological:     Mental Status: She is alert and oriented to person, place, and time.     Sensory: No sensory deficit.  Psychiatric:        Speech: Speech normal.        Behavior: Behavior normal.     No results found for any visits on 02/16/22.      Assessment & Plan:   Problem List Items Addressed This Visit       Digestive   Perioral dermatitis    Resistant to treatment will give oral Accutane and likely will need dermatology referral        Nervous and Auditory   Neuropathy - Primary    Neuropathy present with numbness in arms and legs we will work this up with vitamin levels ANA serum protein electrophoresis and further evaluations we will follow patient will take vitamin D and B12 for now supplements      Relevant Orders   ANA w/Reflex   Sedimentation Rate   Comprehensive metabolic panel   CBC with Differential/Platelet   Protein electrophoresis, serum   Vitamin B12     Other   Breast pain    Bilateral breast  pain unclear etiology plan to obtain diagnostic mammograms      Relevant Orders   MM Digital Diagnostic Bilat   Vitamin D deficiency    Documented vitamin D deficiency we will continue with current supplement      Relevant Orders   VITAMIN D 25 Hydroxy (Vit-D Deficiency, Fractures)    Meds ordered this encounter  Medications   ISOtretinoin (ACCUTANE) 30 MG capsule    Sig: Take 1 capsule (30 mg total) by mouth  2 (two) times daily.    Dispense:  60 capsule    Refill:  0  38 minutes needed extra time for multiple problems to be assessed  Return in about 6 weeks (around 03/30/2022).  [name], MD

## 2022-02-16 NOTE — Assessment & Plan Note (Signed)
Bilateral breast pain unclear etiology plan to obtain diagnostic mammograms

## 2022-02-16 NOTE — Assessment & Plan Note (Signed)
Documented vitamin D deficiency we will continue with current supplement

## 2022-02-20 LAB — CBC WITH DIFFERENTIAL/PLATELET
Basophils Absolute: 0 10*3/uL (ref 0.0–0.2)
Basos: 0 %
EOS (ABSOLUTE): 0.1 10*3/uL (ref 0.0–0.4)
Eos: 2 %
Hematocrit: 45.1 % (ref 34.0–46.6)
Hemoglobin: 14.4 g/dL (ref 11.1–15.9)
Immature Grans (Abs): 0 10*3/uL (ref 0.0–0.1)
Immature Granulocytes: 0 %
Lymphocytes Absolute: 1.8 10*3/uL (ref 0.7–3.1)
Lymphs: 37 %
MCH: 29.4 pg (ref 26.6–33.0)
MCHC: 31.9 g/dL (ref 31.5–35.7)
MCV: 92 fL (ref 79–97)
Monocytes Absolute: 0.5 10*3/uL (ref 0.1–0.9)
Monocytes: 10 %
Neutrophils Absolute: 2.6 10*3/uL (ref 1.4–7.0)
Neutrophils: 51 %
Platelets: 256 10*3/uL (ref 150–450)
RBC: 4.9 x10E6/uL (ref 3.77–5.28)
RDW: 12.7 % (ref 11.7–15.4)
WBC: 5 10*3/uL (ref 3.4–10.8)

## 2022-02-20 LAB — PROTEIN ELECTROPHORESIS, SERUM
A/G Ratio: 1.3 (ref 0.7–1.7)
Albumin ELP: 3.9 g/dL (ref 2.9–4.4)
Alpha 1: 0.2 g/dL (ref 0.0–0.4)
Alpha 2: 0.6 g/dL (ref 0.4–1.0)
Beta: 1 g/dL (ref 0.7–1.3)
Gamma Globulin: 1.2 g/dL (ref 0.4–1.8)
Globulin, Total: 3 g/dL (ref 2.2–3.9)

## 2022-02-20 LAB — COMPREHENSIVE METABOLIC PANEL
ALT: 14 IU/L (ref 0–32)
AST: 13 IU/L (ref 0–40)
Albumin/Globulin Ratio: 1.8 (ref 1.2–2.2)
Albumin: 4.4 g/dL (ref 3.9–4.9)
Alkaline Phosphatase: 54 IU/L (ref 44–121)
BUN/Creatinine Ratio: 18 (ref 9–23)
BUN: 14 mg/dL (ref 6–20)
Bilirubin Total: 0.5 mg/dL (ref 0.0–1.2)
CO2: 20 mmol/L (ref 20–29)
Calcium: 9.8 mg/dL (ref 8.7–10.2)
Chloride: 104 mmol/L (ref 96–106)
Creatinine, Ser: 0.77 mg/dL (ref 0.57–1.00)
Globulin, Total: 2.5 g/dL (ref 1.5–4.5)
Glucose: 92 mg/dL (ref 70–99)
Potassium: 4 mmol/L (ref 3.5–5.2)
Sodium: 140 mmol/L (ref 134–144)
Total Protein: 6.9 g/dL (ref 6.0–8.5)
eGFR: 105 mL/min/{1.73_m2} (ref >59)

## 2022-02-20 LAB — ANA W/REFLEX: ANA Titer 1: NEGATIVE

## 2022-02-20 LAB — VITAMIN B12: Vitamin B-12: 1175 pg/mL (ref 232–1245)

## 2022-02-20 LAB — VITAMIN D 25 HYDROXY (VIT D DEFICIENCY, FRACTURES): Vit D, 25-Hydroxy: 31.3 ng/mL (ref 30.0–100.0)

## 2022-02-20 LAB — SEDIMENTATION RATE: Sed Rate: 11 mm/hr (ref 0–32)

## 2022-02-20 NOTE — Progress Notes (Signed)
Let pt know Vit D normal, stay on supplement, no diabetes, blood count normal, B12 normal, workup for secondary cause of nerve inflammation is normal

## 2022-02-22 ENCOUNTER — Telehealth: Payer: Self-pay

## 2022-02-22 ENCOUNTER — Ambulatory Visit: Payer: Self-pay | Admitting: *Deleted

## 2022-02-22 NOTE — Telephone Encounter (Signed)
She can Contact her OBGYN and make an appt

## 2022-02-22 NOTE — Telephone Encounter (Signed)
  Chief Complaint: Unable to obtain Accutane 30 mg from pharmacy.   They have some requirements before they will give it to me.     Symptoms:  Frequency:  Pertinent Negatives: Patient denies  Disposition: [] ED /[] Urgent Care (no appt availability in office) / [] Appointment(In office/virtual)/ []  [place] [facility]/ [] Home Care/ [] Refused Recommended Disposition /[] [place] [facility]/ [x]  Follow-up with PCP Additional Notes: Message with pharmacy requirements sent to Dr. Asencion Noble.    Pt. Agreeable to someone calling her back.

## 2022-02-22 NOTE — Telephone Encounter (Signed)
Pt was called and is aware of results, DOB was confirmed.  ?

## 2022-02-22 NOTE — Telephone Encounter (Signed)
-----   Message from Elsie Stain, MD sent at 02/20/2022  3:22 PM EDT ----- Let pt know Vit D normal, stay on supplement, no diabetes, blood count normal, B12 normal, workup for secondary cause of nerve inflammation is normal

## 2022-02-22 NOTE — Telephone Encounter (Signed)
Message from [name] sent at 02/22/2022  2:16 PM EDT  Summary: Medication Questions   Patient stated she was given an rx for ISOtretinoin (ACCUTANE) 30 MG capsule and pharmacy is requesting a negative pregnancy test and other information. Please advise.           Call History   Type Contact Phone/Fax User  02/22/2022 02:15 PM EDT Phone (Incoming) Shirley, Baker (Self) [phone] (H) [name], [name]   Reason for Disposition  [1] Caller has URGENT medicine question about med that PCP or specialist prescribed AND [2] triager unable to answer question    Pharmacy requirements for the Accutane 30 mg.  Answer Assessment - Initial Assessment Questions 1. NAME of MEDICINE: "What medicine(s) are you calling about?"     Accutane 30 mg 2. QUESTION: "What is your question?" (e.g., double dose of medicine, side effect)      The pharmacy refused to give me the prescription Accutane because they need a pledge number, a negative pregnancy test and there is a special form that has to be filled out.    So I'm still not taking the medication.  Dr. Asencion Noble prescribed it during her OV 02/16/2022.  3. PRESCRIBER: "Who prescribed the medicine?" Reason: if prescribed by specialist, call should be referred to that group.      Dr. Asencion Noble with [facility]  4. SYMPTOMS: "Do you have any symptoms?" If Yes, ask: "What symptoms are you having?"  "How bad are the symptoms (e.g., mild, moderate, severe)      In chart he noted it was for perioral dermatitis  5. PREGNANCY:  "Is there any chance that you are pregnant?" "When was your last menstrual period?"     No   I'm on the birth control pill  Protocols used: Medication Question Call-A-AH

## 2022-02-23 ENCOUNTER — Other Ambulatory Visit: Payer: Self-pay | Admitting: Critical Care Medicine

## 2022-02-23 ENCOUNTER — Encounter: Payer: Self-pay | Admitting: Nurse Practitioner

## 2022-02-23 DIAGNOSIS — N644 Mastodynia: Secondary | ICD-10-CM

## 2022-02-23 NOTE — Telephone Encounter (Signed)
Called patient and she stated that she doesn't have a OBGYN and wants a referral, please

## 2022-02-23 NOTE — Telephone Encounter (Signed)
Called patient and she is aware that she need to do a pregnancy test, Patient verbalized understanding and will come to do it.

## 2022-02-25 ENCOUNTER — Ambulatory Visit: Payer: Medicaid Other | Attending: Nurse Practitioner

## 2022-02-25 DIAGNOSIS — L700 Acne vulgaris: Secondary | ICD-10-CM

## 2022-02-25 LAB — POCT URINE PREGNANCY: Preg Test, Ur: NEGATIVE

## 2022-02-25 NOTE — Telephone Encounter (Signed)
Patient was in office and was given information

## 2022-02-26 ENCOUNTER — Telehealth: Payer: Self-pay

## 2022-02-26 NOTE — Telephone Encounter (Signed)
-----   Message from Elsie Stain, MD sent at 02/26/2022  6:38 AM EDT ----- Let pt know preg test NEG

## 2022-02-26 NOTE — Progress Notes (Signed)
Let pt know preg test NEG

## 2022-02-26 NOTE — Telephone Encounter (Signed)
Called patient and she was made aware of result in office yesterday.

## 2022-03-01 NOTE — Telephone Encounter (Signed)
[place] friend.   You have to fax that result to the pharmacy she's filling it at. They have to have a valid pregnancy test in their system to be able to fill it.

## 2022-03-01 NOTE — Telephone Encounter (Signed)
Called [facility] pharmacy and will fax paperwork for medication

## 2022-03-02 NOTE — Telephone Encounter (Signed)
Pt called to report that the pharmacy is still waiting for the requested info. Please advise, pt would like a call back [phone]

## 2022-03-02 NOTE — Telephone Encounter (Signed)
Called patient to let her know that I faxed in the paperwork in yesterday and I also faxed them in today

## 2022-03-03 NOTE — Telephone Encounter (Signed)
Called pharmacy again they stated that the patient should be able to get accutane

## 2022-04-05 ENCOUNTER — Ambulatory Visit: Payer: Medicaid Other

## 2022-04-05 ENCOUNTER — Ambulatory Visit: Payer: Medicaid Other | Attending: Nurse Practitioner | Admitting: Nurse Practitioner

## 2022-04-05 ENCOUNTER — Encounter: Payer: Self-pay | Admitting: Nurse Practitioner

## 2022-04-05 ENCOUNTER — Ambulatory Visit
Admission: RE | Admit: 2022-04-05 | Discharge: 2022-04-05 | Disposition: A | Discharge status: Home / Self Care | Payer: Medicaid Other | Source: Ambulatory Visit | Attending: Critical Care Medicine | Admitting: Critical Care Medicine

## 2022-04-05 VITALS — BP 126/84 | HR 65 | Temp 98.1°F | Ht 61.0 in | Wt 134.2 lb

## 2022-04-05 DIAGNOSIS — G629 Polyneuropathy, unspecified: Secondary | ICD-10-CM

## 2022-04-05 DIAGNOSIS — Z0120 Encounter for dental examination and cleaning without abnormal findings: Secondary | ICD-10-CM

## 2022-04-05 DIAGNOSIS — N644 Mastodynia: Secondary | ICD-10-CM

## 2022-04-05 DIAGNOSIS — L700 Acne vulgaris: Secondary | ICD-10-CM | POA: Diagnosis not present

## 2022-04-05 MED ORDER — CYCLOBENZAPRINE HCL 5 MG PO TABS: 5.0000 mg | ORAL_TABLET | Freq: Every day | ORAL | 1 refills | Status: DC

## 2022-04-05 MED ORDER — GABAPENTIN 800 MG PO TABS: 800.0000 mg | ORAL_TABLET | Freq: Three times a day (TID) | ORAL | 3 refills | Status: DC

## 2022-04-05 NOTE — Progress Notes (Signed)
Assessment & Plan:  [name] was seen today for peripheral neuropathy.  Diagnoses and all orders for this visit:  Acne vulgaris -     Ambulatory referral to Dermatology Wants to try accutane. I do not prescribe this medication  Neuropathy -     gabapentin (NEURONTIN) 800 MG tablet; Take 1 tablet (800 mg total) by mouth 3 (three) times daily. -     Ambulatory referral to Neurology -     cyclobenzaprine (FLEXERIL) 5 MG tablet; Take 1 tablet (5 mg total) by mouth at bedtime.  Need for assessment by dentistry for poor dentition -     Ambulatory referral to Dentistry    Patient has been counseled on age-appropriate routine health concerns for screening and prevention. These are reviewed and up-to-date. Referrals have been placed accordingly. Immunizations are up-to-date or declined.    Subjective:   Chief Complaint  Patient presents with   Peripheral Neuropathy   HPI Shirley Baker 31 y.o. female presents to office today for peripheral neuropathy   She endorses intermittent numbness in both arms and legs. Workup (ANA, Sed rate, serum protein electrophoresis, Vit D and B12 normal).Will trial gabapentin at this time. She reports 600 mg was not effective.    She has perioral acne which has markedly improved with accutane. This was not prescribed by me nor do I prescribe it for my child bearing aged patients. She has been referred to Dermatology  Review of Systems  Constitutional:  Negative for fever, malaise/fatigue and weight loss.  HENT: Negative.  Negative for nosebleeds.   Eyes: Negative.  Negative for blurred vision, double vision and photophobia.  Respiratory: Negative.  Negative for cough and shortness of breath.   Cardiovascular: Negative.  Negative for chest pain, palpitations and leg swelling.  Gastrointestinal: Negative.  Negative for heartburn, nausea and vomiting.  Musculoskeletal: Negative.  Negative for myalgias.  Neurological:  Positive for tingling and sensory  change. Negative for dizziness, focal weakness, seizures and headaches.  Psychiatric/Behavioral: Negative.  Negative for suicidal ideas.     Past Medical History:  Diagnosis Date   Anemia    Delivery by classical cesarean section 02/10/2020   J incision on uterus due to fetal malpresentation and difficulty with delivery NEEDS DELIVERY AT 36-37 WEEKS FOR SUBSEQUENT PREGNANCIES   Heart murmur    Ovarian cyst    Syncope     Past Surgical History:  Procedure Laterality Date   CESAREAN SECTION N/A 02/10/2020   Procedure: CESAREAN SECTION;  Surgeon: Osborne Oman, MD;  Location: [facility] LD ORS;  Service: Obstetrics;  Laterality: N/A;   NO PAST SURGERIES      Family History  Problem Relation Age of Onset   Healthy Mother    Healthy Father    Hearing loss Neg Hx     Social History Reviewed with no changes to be made today.   Outpatient Medications Prior to Visit  Medication Sig Dispense Refill   cholecalciferol (VITAMIN D3) 25 MCG (1000 UNIT) tablet Take 1,000 Units by mouth daily.     gabapentin (NEURONTIN) 600 MG tablet TAKE 1/2 TABLET(300 MG) BY MOUTH AT BEDTIME 15 tablet 1   No facility-administered medications prior to visit.    No Known Allergies     Objective:    BP 126/84   Pulse 65   Temp 98.1 F (36.7 C) (Temporal)   Ht 5\' 1"  (1.549 m)   Wt 134 lb 3.2 oz (60.9 kg)   LMP 03/25/2022 (Approximate)   SpO2 98%  BMI 25.36 kg/m  Wt Readings from Last 3 Encounters:  04/05/22 134 lb 3.2 oz (60.9 kg)  02/16/22 134 lb 9.6 oz (61.1 kg)  04/17/21 135 lb (61.2 kg)    Physical Exam Vitals and nursing note reviewed.  Constitutional:      Appearance: She is well-developed.  HENT:     Head: Normocephalic and atraumatic.  Cardiovascular:     Rate and Rhythm: Normal rate and regular rhythm.     Heart sounds: Normal heart sounds. No murmur heard.    No friction rub. No gallop.  Pulmonary:     Effort: Pulmonary effort is normal. No tachypnea or respiratory distress.      Breath sounds: Normal breath sounds. No decreased breath sounds, wheezing, rhonchi or rales.  Chest:     Chest wall: No tenderness.  Abdominal:     General: Bowel sounds are normal.     Palpations: Abdomen is soft.  Musculoskeletal:        General: Normal range of motion.     Cervical back: Normal range of motion.  Skin:    General: Skin is warm and dry.  Neurological:     Mental Status: She is alert and oriented to person, place, and time.     Coordination: Coordination normal.  Psychiatric:        Behavior: Behavior normal. Behavior is cooperative.        Thought Content: Thought content normal.        Judgment: Judgment normal.          Patient has been counseled extensively about nutrition and exercise as well as the importance of adherence with medications and regular follow-up. The patient was given clear instructions to go to ER or return to medical center if symptoms don't improve, worsen or new problems develop. The patient verbalized understanding.   Follow-up: Return if symptoms worsen or fail to improve.   [name], FNP-BC [facility], [place]   04/11/2022, 8:29 PM

## 2022-04-11 ENCOUNTER — Encounter: Payer: Self-pay | Admitting: Nurse Practitioner

## 2022-05-13 ENCOUNTER — Other Ambulatory Visit: Payer: Self-pay

## 2022-05-13 ENCOUNTER — Encounter: Payer: Self-pay | Admitting: Obstetrics and Gynecology

## 2022-05-13 ENCOUNTER — Other Ambulatory Visit (HOSPITAL_COMMUNITY)
Admission: RE | Admit: 2022-05-13 | Discharge: 2022-05-13 | Disposition: A | Discharge status: Home / Self Care | Payer: Medicaid Other | Source: Ambulatory Visit | Attending: Obstetrics and Gynecology | Admitting: Obstetrics and Gynecology

## 2022-05-13 ENCOUNTER — Ambulatory Visit (INDEPENDENT_AMBULATORY_CARE_PROVIDER_SITE_OTHER): Payer: Medicaid Other | Admitting: Obstetrics and Gynecology

## 2022-05-13 VITALS — BP 106/70 | HR 70 | Ht 61.0 in | Wt 133.5 lb

## 2022-05-13 DIAGNOSIS — Z30015 Encounter for initial prescription of vaginal ring hormonal contraceptive: Secondary | ICD-10-CM

## 2022-05-13 DIAGNOSIS — Z30432 Encounter for removal of intrauterine contraceptive device: Secondary | ICD-10-CM | POA: Diagnosis not present

## 2022-05-13 DIAGNOSIS — Z113 Encounter for screening for infections with a predominantly sexual mode of transmission: Secondary | ICD-10-CM | POA: Diagnosis not present

## 2022-05-13 DIAGNOSIS — Z124 Encounter for screening for malignant neoplasm of cervix: Secondary | ICD-10-CM | POA: Insufficient documentation

## 2022-05-13 MED ORDER — ETONOGESTREL-ETHINYL ESTRADIOL 0.12-0.015 MG/24HR VA RING: VAGINAL_RING | VAGINAL | 12 refills | Status: DC

## 2022-05-13 NOTE — Progress Notes (Signed)
NEW GYNECOLOGY PATIENT Patient name: Shirley Baker MRN [id number]  Date of birth: January 18, 1991 Chief Complaint:   Gynecologic Exam     History:  Shirley Baker is a 31 y.o. [phone] being seen today for IUD removal.    IUD removal due to reported symptoms for the last 2 years since IUD placed after delivery including breast symptoms, neuro symptoms, etc. She would like to try nuvaring instead. Has not been on nuvaring before, has used nexplanon, and this was her first IUD. She does not want a menses and would want continuous suppression with nuvaring.         Gynecologic History Patient's last menstrual period was 05/04/2022 (approximate). Contraception: IUD Last Pap: 03/2018. Result was normal  Last Mammogram: 03/2022.  Result was normal Last Colonoscopy: n/a  Obstetric History OB History  Gravida Para Term Preterm AB Living  4 4 4     4   SAB IAB Ectopic Multiple Live Births        0 4    # Outcome Date GA Lbr Len/2nd Weight Sex Delivery Anes PTL Lv  4 Term 02/10/20 [redacted]w[redacted]d  6 lb 0.3 oz (2.73 kg) M CS-Classical Spinal  LIV  3 Term 10/07/18 [redacted]w[redacted]d  6 lb 4.9 oz (2.86 kg) M Vag-Spont EPI N LIV  2 Term 11/10/15 [redacted]w[redacted]d 25:23 / 00:03 4 lb 15.9 oz (2.265 kg) F Vag-Spont None  LIV     Birth Comments: wnl except sga  1 Term 07/01/10 [redacted]w[redacted]d  6 lb (2.722 kg) F Vag-Spont   LIV     Birth Comments: some high blood pressure during pregnancy, but not on meds    Obstetric Comments  LBW    Past Medical History:  Diagnosis Date   Anemia    Delivery by classical cesarean section 02/10/2020   J incision on uterus due to fetal malpresentation and difficulty with delivery NEEDS DELIVERY AT 36-37 WEEKS FOR SUBSEQUENT PREGNANCIES   Heart murmur    Ovarian cyst    Syncope     Past Surgical History:  Procedure Laterality Date   CESAREAN SECTION N/A 02/10/2020   Procedure: CESAREAN SECTION;  Surgeon: Osborne Oman, MD;  Location: [facility] LD [facility]S;  Service: Obstetrics;  Laterality: N/A;   NO PAST  SURGERIES      Current Outpatient Medications on File Prior to Visit  Medication Sig Dispense Refill   cholecalciferol (VITAMIN D3) 25 MCG (1000 UNIT) tablet Take 1,000 Units by mouth daily.     cyclobenzaprine (FLEXERIL) 5 MG tablet Take 1 tablet (5 mg total) by mouth at bedtime. 30 tablet 1   gabapentin (NEURONTIN) 800 MG tablet Take 1 tablet (800 mg total) by mouth 3 (three) times daily. 90 tablet 3   No current facility-administered medications on file prior to visit.    No Known Allergies  Social History:  reports that she has never smoked. She has never used smokeless tobacco. She reports that she does not drink alcohol and does not use drugs.  Family History  Problem Relation Age of Onset   Healthy Mother    Healthy Father    Hearing loss Neg Hx     The following portions of the patient's history were reviewed and updated as appropriate: allergies, current medications, past family history, past medical history, past social history, past surgical history and problem list.  Review of Systems Pertinent items noted in HPI and remainder of comprehensive ROS otherwise negative.  Physical Exam:  BP 106/70   Pulse 70  Ht 5\' 1"  (1.549 m)   Wt 133 lb 8 oz (60.6 kg)   LMP 05/04/2022 (Approximate)   Breastfeeding Unknown   BMI 25.22 kg/m  Physical Exam Vitals and nursing note reviewed. Exam conducted with a chaperone present.  Constitutional:      Appearance: Normal appearance.  Cardiovascular:     Rate and Rhythm: Normal rate.  Pulmonary:     Effort: Pulmonary effort is normal.     Breath sounds: Normal breath sounds.  Genitourinary:    General: Normal vulva.     Exam position: Lithotomy position.     Vagina: Normal.     Cervix: Normal.     Comments: IUD strings visualized Neurological:     General: No focal deficit present.     Mental Status: She is alert and oriented to person, place, and time.  Psychiatric:        Mood and Affect: Mood normal.        Behavior:  Behavior normal.        Thought Content: Thought content normal.        Judgment: Judgment normal.    IUD Removal  Patient identified, informed consent performed, consent signed.  Patient was in the dorsal lithotomy position, normal external genitalia was noted.  A speculum was placed in the patient's vagina, normal discharge was noted, no lesions. The cervix was visualized, no lesions, no abnormal discharge.  The strings of the IUD were visualized, grasped and pulled using ring forceps. The IUD was removed in its entirety. . Patient tolerated the procedure well.    Patient will use NuvaRing for contraception.   Routine preventative health maintenance measures emphasized.      Assessment and Plan:   1. Encounter for removal of intrauterine contraceptive device (IUD) S/p uncomplicated removal   2. Encounter for initial prescription of vaginal ring hormonal contraceptive The use of the [facility] has been fully discussed with the patient. This includes the proper method to initiate (i.e. Sunday start after next normal menstrual onset versus same day start) and continue the medication, the need for regular compliance to ensure adequate contraceptive effect, the physiology which make the pill effective, the instructions for what to do in event of a missed pill, and warnings about anticipated minor side effects such as breakthrough spotting, nausea, breast tenderness, weight changes, acne, headaches, etc.  They have been told of the more serious potential side effects such as MI, stroke, and deep vein thrombosis, all of which are very unlikely.  They have been asked to report any signs of such serious problems immediately.  They should back up the pill with a condom during any cycle in which antibiotics are prescribed, and during the first cycle as well. The need for additional protection, such as a condom, to prevent exposure to sexually transmitted diseases has also been discussed- the patient has been  clearly reminded that CHCs cannot protect them against diseases such as HIV and others. They understand and wish to take the medication as prescribed.    - etonogestrel-ethinyl estradiol (NUVARING) 0.12-0.015 MG/24HR vaginal ring; Insert vaginally and leave in place for 4 consecutive weeks and switch to the next for continuous menstrual suppression  Dispense: 1 each; Refill: 12  3. Screening for cervical cancer Routine screening - Cytology - PAP( )  4. Routine screening for STI (sexually transmitted infection) - Hepatitis B surface antigen - Hepatitis C antibody - HIV Antibody (routine testing w rflx) - RPR    Routine preventative health  maintenance measures emphasized. Please refer to After Visit Summary for other counseling recommendations.   Follow-up: Return in about 1 year (around 05/14/2023) for Annual GYN.      Lorriane Shire, MD Obstetrician & Gynecologist, [facility] [employer], [facility]

## 2022-05-14 LAB — HIV ANTIBODY (ROUTINE TESTING W REFLEX): HIV Screen 4th Generation wRfx: NONREACTIVE

## 2022-05-14 LAB — HEPATITIS C ANTIBODY: Hep C Virus Ab: NONREACTIVE

## 2022-05-14 LAB — RPR: RPR Ser Ql: NONREACTIVE

## 2022-05-14 LAB — HEPATITIS B SURFACE ANTIGEN: Hepatitis B Surface Ag: NEGATIVE

## 2022-05-17 ENCOUNTER — Encounter: Payer: Medicaid Other | Admitting: Obstetrics and Gynecology

## 2022-05-19 LAB — CYTOLOGY - PAP
Chlamydia: NEGATIVE
Comment: NEGATIVE
Comment: NEGATIVE
Comment: NEGATIVE
Comment: NORMAL
Diagnosis: UNDETERMINED — AB
High risk HPV: NEGATIVE
Neisseria Gonorrhea: NEGATIVE
Trichomonas: NEGATIVE

## 2022-06-07 ENCOUNTER — Ambulatory Visit: Payer: Medicaid Other | Admitting: Diagnostic Neuroimaging

## 2022-06-07 ENCOUNTER — Encounter: Payer: Self-pay | Admitting: Diagnostic Neuroimaging

## 2022-06-07 ENCOUNTER — Telehealth: Payer: Self-pay | Admitting: Diagnostic Neuroimaging

## 2022-06-07 VITALS — BP 109/74 | HR 64 | Ht 60.0 in | Wt 132.8 lb

## 2022-06-07 DIAGNOSIS — R531 Weakness: Secondary | ICD-10-CM | POA: Diagnosis not present

## 2022-06-07 DIAGNOSIS — R200 Anesthesia of skin: Secondary | ICD-10-CM

## 2022-06-07 DIAGNOSIS — R52 Pain, unspecified: Secondary | ICD-10-CM | POA: Diagnosis not present

## 2022-06-07 NOTE — Patient Instructions (Signed)
INTERMITTENT, MIGRATORY SHARP PAIN SENSATIONS, NUMBNESS, TINGLING, WEAKNESS (back of head, bilateral arms, bilateral legs; since ~2023) - check MRI brain and cervical spine  - may wean off gabapentin; not that effective; symptoms are mild

## 2022-06-07 NOTE — Progress Notes (Unsigned)
[facility]  PATIENT: Shirley Baker DOB: 07/13/1990  REFERRING CLINICIAN: Claiborne Rigg, NP HISTORY FROM: patient  REASON FOR VISIT: new consult   HISTORICAL  CHIEF COMPLAINT:  Chief Complaint  Patient presents with   New Patient (Initial Visit)    Pt in room #7 and alone. Pt here today to discuss her neuropathy.    HISTORY OF PRESENT ILLNESS:   32 year old female here for evaluation of numbness and tingling.  For past 1 year having intermittent migratory numbness and tingling sensation, sharp pain, in the back of head, arms, hands, legs.  Symptoms are random and may be on 1 side or both sides.  These can happen 2 times per day lasting up to an hour at a time.  Denies any burning sensation.   REVIEW OF SYSTEMS: Full 14 system review of systems performed and negative with exception of: as per HPI.  ALLERGIES: No Known Allergies  HOME MEDICATIONS: Outpatient Medications Prior to Visit  Medication Sig Dispense Refill   cholecalciferol (VITAMIN D3) 25 MCG (1000 UNIT) tablet Take 1,000 Units by mouth daily.     cyclobenzaprine (FLEXERIL) 5 MG tablet Take 1 tablet (5 mg total) by mouth at bedtime. 30 tablet 1   etonogestrel-ethinyl estradiol (NUVARING) 0.12-0.015 MG/24HR vaginal ring Insert vaginally and leave in place for 4 consecutive weeks and switch to the next for continuous menstrual suppression 1 each 12   gabapentin (NEURONTIN) 800 MG tablet Take 1 tablet (800 mg total) by mouth 3 (three) times daily. 90 tablet 3   No facility-administered medications prior to visit.    PAST MEDICAL HISTORY: Past Medical History:  Diagnosis Date   Anemia    Delivery by classical cesarean section 02/10/2020   J incision on uterus due to fetal malpresentation and difficulty with delivery NEEDS DELIVERY AT 36-37 WEEKS FOR SUBSEQUENT PREGNANCIES   Heart murmur    Ovarian cyst    Syncope     PAST SURGICAL HISTORY: Past Surgical History:  Procedure Laterality  Date   CESAREAN SECTION N/A 02/10/2020   Procedure: CESAREAN SECTION;  Surgeon: Tereso Newcomer, MD;  Location: [facility] LD ORS;  Service: Obstetrics;  Laterality: N/A;   NO PAST SURGERIES      FAMILY HISTORY: Family History  Problem Relation Age of Onset   Healthy Mother    Healthy Father    Hearing loss Neg Hx     SOCIAL HISTORY: Social History   Socioeconomic History   Marital status: Single    Spouse name: Not on file   Number of children: 2   Years of education: Not on file   Highest education level: Associate degree: occupational, Scientist, product/process development, or vocational program  Occupational History   Not on file  Tobacco Use   Smoking status: Never   Smokeless tobacco: Never   Tobacco comments:    hookah, last Aug 2019- patient denies 08/16/2019  Vaping Use   Vaping Use: Never used  Substance and Sexual Activity   Alcohol use: No   Drug use: No   Sexual activity: Yes    Birth control/protection: None  Other Topics Concern   Not on file  Social History Narrative   Not on file   Social Determinants of Health   Financial Resource Strain: Low Risk  (04/11/2018)   Overall Financial Resource Strain (CARDIA)    Difficulty of Paying Living Expenses: Not hard at all  Food Insecurity: No Food Insecurity (08/16/2019)   Hunger Vital Sign    Worried About  Running Out of Food in the Last Year: Never true    [place] in the Last Year: Never true  Transportation Needs: No Transportation Needs (08/16/2019)   PRAPARE - Hydrologist (Medical): No    Lack of Transportation (Non-Medical): No  Physical Activity: Unknown (04/11/2018)   Exercise Vital Sign    Days of Exercise per Week: 0 days    Minutes of Exercise per Session: Not on file  Stress: No Stress Concern Present (04/11/2018)   [place]    Feeling of Stress : Not at all  Social Connections: Somewhat Isolated (04/11/2018)   Social  Connection and Isolation Panel [NHANES]    Frequency of Communication with Friends and Family: More than three times a week    Frequency of Social Gatherings with Friends and Family: Twice a week    Attends Religious Services: More than 4 times per year    Active Member of [employer] or Organizations: No    Attends Archivist Meetings: Never    Marital Status: Never married  Intimate Partner Violence: Not At Risk (08/23/2019)   Humiliation, Afraid, Rape, and Kick questionnaire    Fear of Current or Ex-Partner: No    Emotionally Abused: No    Physically Abused: No    Sexually Abused: No     PHYSICAL EXAM  GENERAL EXAM/CONSTITUTIONAL: Vitals:  Vitals:   06/07/22 0841  BP: 109/74  Pulse: 64  Weight: 132 lb 12.8 oz (60.2 kg)  Height: 5' (1.524 m)   Body mass index is 25.94 kg/m. Wt Readings from Last 3 Encounters:  06/07/22 132 lb 12.8 oz (60.2 kg)  05/13/22 133 lb 8 oz (60.6 kg)  04/05/22 134 lb 3.2 oz (60.9 kg)   Patient is in no distress; well developed, nourished and groomed; neck is supple  CARDIOVASCULAR: Examination of carotid arteries is normal; no carotid bruits Regular rate and rhythm, no murmurs Examination of peripheral vascular system by observation and palpation is normal  EYES: Ophthalmoscopic exam of optic discs and posterior segments is normal; no papilledema or hemorrhages No results found.  MUSCULOSKELETAL: Gait, strength, tone, movements noted in Neurologic exam below  NEUROLOGIC: MENTAL STATUS:      No data to display         awake, alert, oriented to person, place and time recent and remote memory intact normal attention and concentration language fluent, comprehension intact, naming intact fund of knowledge appropriate  CRANIAL NERVE:  2nd - no papilledema on fundoscopic exam 2nd, 3rd, 4th, 6th - pupils equal and reactive to light, visual fields full to confrontation, extraocular muscles intact, no nystagmus 5th - facial  sensation symmetric 7th - facial strength symmetric 8th - hearing intact 9th - palate elevates symmetrically, uvula midline 11th - shoulder shrug symmetric 12th - tongue protrusion midline  MOTOR:  normal bulk and tone, full strength in the BUE, BLE  SENSORY:  normal and symmetric to light touch, pinprick, temperature, vibration  COORDINATION:  finger-nose-finger, fine finger movements normal  REFLEXES:  deep tendon reflexes present and symmetric  GAIT/STATION:  narrow based gait    DIAGNOSTIC DATA (LABS, IMAGING, TESTING) - I reviewed patient records, labs, notes, testing and imaging myself where available.  Lab Results  Component Value Date   WBC 5.0 02/16/2022   HGB 14.4 02/16/2022   HCT 45.1 02/16/2022   MCV 92 02/16/2022   PLT 256 02/16/2022  Component Value Date/Time   NA 140 02/16/2022 1045   K 4.0 02/16/2022 1045   CL 104 02/16/2022 1045   CO2 20 02/16/2022 1045   GLUCOSE 92 02/16/2022 1045   GLUCOSE 97 03/23/2021 0020   BUN 14 02/16/2022 1045   CREATININE 0.77 02/16/2022 1045   CALCIUM 9.8 02/16/2022 1045   PROT 6.9 02/16/2022 1045   ALBUMIN 4.4 02/16/2022 1045   AST 13 02/16/2022 1045   ALT 14 02/16/2022 1045   ALKPHOS 54 02/16/2022 1045   BILITOT 0.5 02/16/2022 1045   GFRNONAA >60 03/23/2021 0020   GFRAA >60 02/11/2020 0432   No results found for: "CHOL", "HDL", "LDLCALC", "LDLDIRECT", "TRIG", "CHOLHDL" Lab Results  Component Value Date   HGBA1C 4.9 08/23/2019   Lab Results  Component Value Date   VITAMINB12 1,175 02/16/2022   Lab Results  Component Value Date   TSH 0.923 03/25/2021      ASSESSMENT AND PLAN  32 y.o. year old female here with:   Dx:  1. Numbness   2. Pain   3. Weakness      PLAN:  INTERMITTENT, MIGRATORY SHARP PAIN SENSATIONS, NUMBNESS, TINGLING, WEAKNESS (back of head, bilateral arms, bilateral legs; since ~2023) - neuro exam today unremarkable; neuropathy labs unremarkable; unlikely to represent  peripheral neuropathy - check MRI brain and cervical spine (rule out demyelinating disease, vascular, autoimmune, inflamm causes) - may wean off gabapentin; not that effective; symptoms are mild  Orders Placed This Encounter  Procedures   MR BRAIN W WO CONTRAST   MR CERVICAL SPINE W WO CONTRAST   Return for pending test results, pending if symptoms worsen or fail to improve.    Penni Bombard, MD 12/30/9560, 1:30 AM Certified in Neurology, Neurophysiology and Neuroimaging  [facility] [address] [place], [place] [address] [phone]

## 2022-06-07 NOTE — Telephone Encounter (Signed)
[employer]y [place] [employer]: [id number] exp. 06/07/22-08/05/22 sent to GI [phone]

## 2022-07-15 ENCOUNTER — Telehealth: Payer: Medicaid Other | Admitting: Physician Assistant

## 2022-07-15 ENCOUNTER — Ambulatory Visit: Payer: Self-pay | Admitting: *Deleted

## 2022-07-15 ENCOUNTER — Encounter: Payer: Self-pay | Admitting: Physician Assistant

## 2022-07-15 VITALS — BP 112/71 | HR 76 | Ht 60.0 in | Wt 131.0 lb

## 2022-07-15 DIAGNOSIS — N3001 Acute cystitis with hematuria: Secondary | ICD-10-CM | POA: Diagnosis not present

## 2022-07-15 LAB — POCT URINALYSIS DIP (CLINITEK)
Bilirubin, UA: NEGATIVE
Glucose, UA: NEGATIVE mg/dL
Ketones, POC UA: NEGATIVE mg/dL
Nitrite, UA: NEGATIVE
Spec Grav, UA: 1.02 (ref 1.010–1.025)
Urobilinogen, UA: 0.2 E.U./dL
pH, UA: 7.5 (ref 5.0–8.0)

## 2022-07-15 MED ORDER — NITROFURANTOIN MONOHYD MACRO 100 MG PO CAPS: 100.0000 mg | ORAL_CAPSULE | Freq: Two times a day (BID) | ORAL | 0 refills | Status: AC

## 2022-07-15 NOTE — Patient Instructions (Addendum)
You are going to take macrobid twice a day for 5 days.  Make sure that get plenty of rest and drink lots of water.  You can purchase AZO over the counter to help with discomfort   I hope that you feel better soon  Shirley Rad, PA-C Physician Assistant [facility] [email or web address]   Urinary Tract Infection, Adult  A urinary tract infection (UTI) is an infection of any part of the urinary tract. The urinary tract includes the kidneys, ureters, bladder, and urethra. These organs make, store, and get rid of urine in the body. An upper UTI affects the ureters and kidneys. A lower UTI affects the bladder and urethra. What are the causes? Most urinary tract infections are caused by bacteria in your genital area around your urethra, where urine leaves your body. These bacteria grow and cause inflammation of your urinary tract. What increases the risk? You are more likely to develop this condition if: You have a urinary catheter that stays in place. You are not able to control when you urinate or have a bowel movement (incontinence). You are female and you: Use a spermicide or diaphragm for birth control. Have low estrogen levels. Are pregnant. You have certain genes that increase your risk. You are sexually active. You take antibiotic medicines. You have a condition that causes your flow of urine to slow down, such as: An enlarged prostate, if you are female. Blockage in your urethra. A kidney stone. A nerve condition that affects your bladder control (neurogenic bladder). Not getting enough to drink, or not urinating often. You have certain medical conditions, such as: Diabetes. A weak disease-fighting system (immunesystem). Sickle cell disease. Gout. Spinal cord injury. What are the signs or symptoms? Symptoms of this condition include: Needing to urinate right away (urgency). Frequent urination. This may include small  amounts of urine each time you urinate. Pain or burning with urination. Blood in the urine. Urine that smells bad or unusual. Trouble urinating. Cloudy urine. Vaginal discharge, if you are female. Pain in the abdomen or the lower back. You may also have: Vomiting or a decreased appetite. Confusion. Irritability or tiredness. A fever or chills. Diarrhea. The first symptom in older adults may be confusion. In some cases, they may not have any symptoms until the infection has worsened. How is this diagnosed? This condition is diagnosed based on your medical history and a physical exam. You may also have other tests, including: Urine tests. Blood tests. Tests for STIs (sexually transmitted infections). If you have had more than one UTI, a cystoscopy or imaging studies may be done to determine the cause of the infections. How is this treated? Treatment for this condition includes: Antibiotic medicine. Over-the-counter medicines to treat discomfort. Drinking enough water to stay hydrated. If you have frequent infections or have other conditions such as a kidney stone, you may need to see a health care provider who specializes in the urinary tract (urologist). In rare cases, urinary tract infections can cause sepsis. Sepsis is a life-threatening condition that occurs when the body responds to an infection. Sepsis is treated in the hospital with IV antibiotics, fluids, and other medicines. Follow these instructions at home:  Medicines Take over-the-counter and prescription medicines only as told by your health care provider. If you were prescribed an antibiotic medicine, take it as told by your health care provider. Do not stop using the antibiotic even if you start to feel better. General instructions Make sure you: Empty your bladder often  and completely. Do not hold urine for long periods of time. Empty your bladder after sex. Wipe from front to back after urinating or having a bowel  movement if you are female. Use each tissue only one time when you wipe. Drink enough fluid to keep your urine pale yellow. Keep all follow-up visits. This is important. Contact a health care provider if: Your symptoms do not get better after 1-2 days. Your symptoms go away and then return. Get help right away if: You have severe pain in your back or your lower abdomen. You have a fever or chills. You have nausea or vomiting. Summary A urinary tract infection (UTI) is an infection of any part of the urinary tract, which includes the kidneys, ureters, bladder, and urethra. Most urinary tract infections are caused by bacteria in your genital area. Treatment for this condition often includes antibiotic medicines. If you were prescribed an antibiotic medicine, take it as told by your health care provider. Do not stop using the antibiotic even if you start to feel better. Keep all follow-up visits. This is important. This information is not intended to replace advice given to you by your health care provider. Make sure you discuss any questions you have with your health care provider. Document Revised: 12/28/2019 Document Reviewed: 12/28/2019 Elsevier Patient Education  [place].

## 2022-07-15 NOTE — Telephone Encounter (Signed)
Reason for Disposition  Side (flank) or back pain present  Answer Assessment - Initial Assessment Questions 1. COLOR of URINE: "Describe the color of the urine."  (e.g., tea-colored, pink, red, bloody) "Do you have blood clots in your urine?" (e.g., none, pea, grape, small coin)     Having blood in urine 2. ONSET: "When did the bleeding start?"      Started this morning 2:30 AM   I'm having pain in abd area and discomfort in my back.   I'm burning and painful with urination.    3. EPISODES: "How many times has there been blood in the urine?" or "How many times today?"     Every time I urinate.    At first I thought it was my period but my pad is dry and it's only when I urinate 4. PAIN with URINATION: "Is there any pain with passing your urine?" If Yes, ask: "How bad is the pain?"  (Scale 1-10; or mild, moderate, severe)    - MILD: Complains slightly about urination hurting.    - MODERATE: Interferes with normal activities.      - SEVERE: Excruciating, unwilling or unable to urinate because of the pain.      Burning 5. FEVER: "Do you have a fever?" If Yes, ask: "What is your temperature, how was it measured, and when did it start?"     No 6. ASSOCIATED SYMPTOMS: "Are you passing urine more frequently than usual?"     Yes   When I go urinate I don't feel like it's all coming out 7. OTHER SYMPTOMS: "Do you have any other symptoms?" (e.g., back/flank pain, abdomen pain, vomiting)     See above 8. PREGNANCY: "Is there any chance you are pregnant?" "When was your last menstrual period?"     No  Protocols used: Urine - Blood In-A-AH

## 2022-07-15 NOTE — Telephone Encounter (Signed)
  Chief Complaint: Blood in urine Symptoms: Abd pain, burning with urination, discomfort in her lower back.  Frequency and not feeling like she is able to empty her bladder. Frequency: Started at 2:30 AM this morning Pertinent Negatives: Patient denies Fever Disposition: [] ED /[] Urgent Care (no appt availability in office) / [] Appointment(In office/virtual)/ []  [place] [facility]/ [] Home Care/ [] Refused Recommended Disposition /[x] [facility] [facility]/ []  Follow-up with PCP Additional Notes: No appts at [facility].    She is agreeable to going to the [facility].   I gave her the location and hours for today.

## 2022-07-15 NOTE — Progress Notes (Signed)
Established Patient Office Visit  Subjective   Patient ID: Shirley Baker, female    DOB: Jun 10, 1990  Age: 32 y.o. MRN: [id number]  Chief Complaint  Patient presents with   Hematuria   Dysuria    Back and abdominal pain    Virtual Visit via Video Note  I connected with Constance Haw on 07/15/22 at  3:40 PM EST by a video enabled telemedicine application and verified that I am speaking with the correct person using two identifiers.  Location: Patient: [facility] [facility]  Provider: Home Office    I discussed the limitations of evaluation and management by telemedicine and the availability of in person appointments. The patient expressed understanding and agreed to proceed.  History of Present Illness:    States that she started having suprapubic discomfort, lower back pain, dysuria, frequency, hematuria and incomplete emptying earlier this morning.    Has tried ibuprofen without relief  States that she is using NuvaRing and is confident she is not pregnant.     Observations/Objective: Medical history and current medications reviewed, no physical exam completed Vitals reviewed    Past Medical History:  Diagnosis Date   Anemia    Delivery by classical cesarean section 02/10/2020   J incision on uterus due to fetal malpresentation and difficulty with delivery NEEDS DELIVERY AT 36-37 WEEKS FOR SUBSEQUENT PREGNANCIES   Heart murmur    Ovarian cyst    Syncope    Social History   Socioeconomic History   Marital status: Single    Spouse name: Not on file   Number of children: 2   Years of education: Not on file   Highest education level: Associate degree: occupational, Hotel manager, or vocational program  Occupational History   Not on file  Tobacco Use   Smoking status: Never   Smokeless tobacco: Never   Tobacco comments:    hookah, last Aug 2019- patient denies 08/16/2019  Vaping Use   Vaping Use: Never used  Substance and Sexual Activity   Alcohol  use: No   Drug use: No   Sexual activity: Yes    Birth control/protection: None  Other Topics Concern   Not on file  Social History Narrative   Not on file   Social Determinants of Health   Financial Resource Strain: Low Risk  (04/11/2018)   Overall Financial Resource Strain (CARDIA)    Difficulty of Paying Living Expenses: Not hard at all  Food Insecurity: No Food Insecurity (08/16/2019)   Hunger Vital Sign    Worried About Running Out of Food in the Last Year: Never true    [place] in the Last Year: Never true  Transportation Needs: No Transportation Needs (08/16/2019)   PRAPARE - Hydrologist (Medical): No    Lack of Transportation (Non-Medical): No  Physical Activity: Unknown (04/11/2018)   Exercise Vital Sign    Days of Exercise per Week: 0 days    Minutes of Exercise per Session: Not on file  Stress: No Stress Concern Present (04/11/2018)   [place]    Feeling of Stress : Not at all  Social Connections: Somewhat Isolated (04/11/2018)   Social Connection and Isolation Panel [NHANES]    Frequency of Communication with Friends and Family: More than three times a week    Frequency of Social Gatherings with Friends and Family: Twice a week    Attends Religious Services: More than 4 times per  year    Active Member of Clubs or Organizations: No    Attends Archivist Meetings: Never    Marital Status: Never married  Intimate Partner Violence: Not At Risk (08/23/2019)   Humiliation, Afraid, Rape, and Kick questionnaire    Fear of Current or Ex-Partner: No    Emotionally Abused: No    Physically Abused: No    Sexually Abused: No   Family History  Problem Relation Age of Onset   Healthy Mother    Healthy Father    Hearing loss Neg Hx    No Known Allergies  Review of Systems  Constitutional:  Negative for chills and fever.  HENT: Negative.    Eyes: Negative.    Respiratory:  Negative for shortness of breath.   Cardiovascular:  Negative for chest pain.  Gastrointestinal:  Positive for abdominal pain. Negative for nausea and vomiting.  Genitourinary:  Positive for dysuria, flank pain, frequency and urgency.  Musculoskeletal:  Positive for back pain.  Skin: Negative.   Neurological: Negative.   Endo/Heme/Allergies: Negative.   Psychiatric/Behavioral: Negative.        Objective:     BP 112/71 (BP Location: Left Arm, Patient Position: Sitting, Cuff Size: Normal)   Pulse 76   Ht 5' (1.524 m)   Wt 131 lb (59.4 kg)   LMP 06/16/2022 (Approximate)   SpO2 100%   BMI 25.58 kg/m     Assessment & Plan:   Problem List Items Addressed This Visit   None Visit Diagnoses     Acute cystitis with hematuria    -  Primary   Relevant Medications   nitrofurantoin, macrocrystal-monohydrate, (MACROBID) 100 MG capsule   Other Relevant Orders   POCT URINALYSIS DIP (CLINITEK) (Completed)   Urine Culture      Assessment and Plan: 1. Acute cystitis with hematuria UA positive for blood and large leuk, will treat based on clinical presentation.  Trial Macrobid, AZO OTC.  Patient education given on supportive care.  Red flags given for prompt reevaluation  - POCT URINALYSIS DIP (CLINITEK) - Urine Culture - nitrofurantoin, macrocrystal-monohydrate, (MACROBID) 100 MG capsule; Take 1 capsule (100 mg total) by mouth 2 (two) times daily for 5 days.  Dispense: 10 capsule; Refill: 0   Follow Up Instructions:    I discussed the assessment and treatment plan with the patient. The patient was provided an opportunity to ask questions and all were answered. The patient agreed with the plan and demonstrated an understanding of the instructions.   The patient was advised to call back or seek an in-person evaluation if the symptoms worsen or if the condition fails to improve as anticipated.  I provided 20 minutes of non-face-to-face time during this  encounter.    Return if symptoms worsen or fail to improve.    Loraine Grip Mayers, PA-C

## 2022-07-21 LAB — URINE CULTURE

## 2022-07-30 DIAGNOSIS — L710 Perioral dermatitis: Secondary | ICD-10-CM | POA: Diagnosis not present

## 2022-08-06 ENCOUNTER — Encounter: Payer: Self-pay | Admitting: Diagnostic Neuroimaging

## 2022-08-19 ENCOUNTER — Other Ambulatory Visit: Payer: Medicaid Other

## 2022-08-19 ENCOUNTER — Inpatient Hospital Stay: Admission: RE | Admit: 2022-08-19 | Payer: Medicaid Other | Source: Ambulatory Visit

## 2022-09-06 ENCOUNTER — Ambulatory Visit
Admission: RE | Admit: 2022-09-06 | Discharge: 2022-09-06 | Disposition: A | Discharge status: Home / Self Care | Payer: Medicaid Other | Source: Ambulatory Visit | Attending: Diagnostic Neuroimaging | Admitting: Diagnostic Neuroimaging

## 2022-09-06 DIAGNOSIS — R200 Anesthesia of skin: Secondary | ICD-10-CM

## 2022-09-06 DIAGNOSIS — R531 Weakness: Secondary | ICD-10-CM

## 2022-09-06 DIAGNOSIS — R52 Pain, unspecified: Secondary | ICD-10-CM | POA: Diagnosis not present

## 2022-09-06 MED ORDER — GADOPICLENOL 0.5 MMOL/ML IV SOLN
7.5000 mL | Freq: Once | INTRAVENOUS | Status: AC | PRN
  Administered 2022-09-06: 6.5 mL via INTRAVENOUS

## 2022-11-01 ENCOUNTER — Ambulatory Visit: Payer: Self-pay

## 2022-11-01 NOTE — Telephone Encounter (Signed)
Patient has an appointment with OBGYN on 12/13/2022 to discuss.

## 2022-11-01 NOTE — Telephone Encounter (Signed)
  Chief Complaint: Wants tubal ligation Symptoms:  Frequency:  Pertinent Negatives: Patient denies  Disposition: [] ED /[] Urgent Care (no appt availability in office) / [] Appointment(In office/virtual)/ []  [facility]/ [] Home Care/ [] Refused Recommended Disposition /[] [facility] [facility]/ []  Follow-up with PCP Additional Notes: Pt wants a tubal ligation and does not know where to call. Pt requested the same information in 01/2022 and was given the phone number of the office that cared for her during her pregnancy.  Pt was given the same number again today.  Pt states that she has called them in the past and they do not return her call.  Pt will call again today, and also try to schedule an appt online. Pt will call back if needed.    Summary: Tubes Tied Advice   Pt is calling to ask where she can go to get her tubes tied? Please advise     Reason for Disposition  [1] Caller requesting NON-URGENT health information AND [2] PCP's office is the best resource  Answer Assessment - Initial Assessment Questions 1. REASON FOR CALL or QUESTION: "What is your reason for calling today?" or "How can I best help you?" or "What question do you have that I can help answer?"     Pt wants to have a tubal ligation.  Protocols used: Information Only Call - No Triage-A-AH

## 2022-11-05 DIAGNOSIS — L710 Perioral dermatitis: Secondary | ICD-10-CM | POA: Diagnosis not present

## 2022-12-13 ENCOUNTER — Other Ambulatory Visit: Payer: Self-pay

## 2022-12-13 ENCOUNTER — Ambulatory Visit: Payer: Medicaid Other | Admitting: Obstetrics and Gynecology

## 2022-12-13 ENCOUNTER — Encounter: Payer: Self-pay | Admitting: Obstetrics and Gynecology

## 2022-12-13 VITALS — BP 120/83 | HR 69 | Ht 61.0 in | Wt 138.0 lb

## 2022-12-13 DIAGNOSIS — Z3009 Encounter for other general counseling and advice on contraception: Secondary | ICD-10-CM

## 2022-12-13 NOTE — Progress Notes (Signed)
    GYNECOLOGY VISIT  Patient name: Shirley Baker MRN [id number]  Date of birth: [birth year] Chief Complaint:   Contraception  History:  Shirley Baker is a 32 y.o. [phone] being seen today for TL consult. She is sure she has completed childbearing.      Past Medical History:  Diagnosis Date   Anemia    Delivery by classical cesarean section 02/10/2020   J incision on uterus due to fetal malpresentation and difficulty with delivery NEEDS DELIVERY AT 36-37 WEEKS FOR SUBSEQUENT PREGNANCIES   Heart murmur    Ovarian cyst    Syncope     Past Surgical History:  Procedure Laterality Date   CESAREAN SECTION N/A 02/10/2020   Procedure: CESAREAN SECTION;  Surgeon: Tereso Newcomer, MD;  Location: [facility];  Service: Obstetrics;  Laterality: N/A;   NO PAST SURGERIES      The following portions of the patient's history were reviewed and updated as appropriate: allergies, current medications, past family history, past medical history, past social history, past surgical history and problem list.   Health Maintenance:   Last pap     Component Value Date/Time   DIAGPAP (A) 05/13/2022 1010    - Atypical squamous cells of undetermined significance (ASC-US)   HPVHIGH Negative 05/13/2022 1010   ADEQPAP  05/13/2022 1010    Satisfactory for evaluation; transformation zone component PRESENT.    High Risk HPV: Positive  Adequacy:  Satisfactory for evaluation, transformation zone component PRESENT  Diagnosis:  Atypical squamous cells of undetermined significance (ASC-US)    Review of Systems:  Pertinent items are noted in HPI. Comprehensive review of systems was otherwise negative.   Objective:  Physical Exam BP 120/83   Pulse 69   Ht 5\' 1"  (1.549 m)   Wt 138 lb (62.6 kg)   LMP 12/11/2022 (Exact Date)   BMI 26.07 kg/m    Physical Exam Vitals and nursing note reviewed.  Constitutional:      Appearance: Normal appearance.  HENT:     Head: Normocephalic and atraumatic.   Pulmonary:     Effort: Pulmonary effort is normal.  Skin:    General: Skin is warm and dry.  Neurological:     General: No focal deficit present.     Mental Status: She is alert.  Psychiatric:        Mood and Affect: Mood normal.        Behavior: Behavior normal.        Thought Content: Thought content normal.        Judgment: Judgment normal.       Assessment & Plan:   1. Sterilization consult  She desires permanent sterilization. Discussed alternatives including LARC options and vasectomy. She declines these options. Discussed surgery of salpingectomy vs tubal ligation. She would like to do a salpingectomy.  Risks of surgery include but are not limited to: bleeding, infection, injury to surrounding organs/tissues (i.e. bowel/bladder/ureters), need for additional procedures, wound complications, hospital re-admission, regret,  conversion to open surgery, and VTE.  Reviewed restrictions and recovery following surgery  Sterilization consent signed today     Routine preventative health maintenance measures emphasized.  Lorriane Shire, MD Minimally Invasive Gynecologic Surgery Center for [facility], [facility]

## 2022-12-15 ENCOUNTER — Encounter: Payer: Self-pay | Admitting: *Deleted

## 2023-01-13 ENCOUNTER — Telehealth: Payer: Self-pay | Admitting: Family Medicine

## 2023-01-13 NOTE — Telephone Encounter (Signed)
Call returned to pt and discussed her concern. I stated that it is not unusual for scheduling of elective surgeries to take a month or more to be scheduled. I asked her to be patient and that I will send message to the surgery scheduler as a reminder that she is waiting. I assured her that she will be notified immediately once the surgery has been scheduled. Pt stated that she is currently trying to use condoms for contraception but her husband does not like them. This is  because the [place]uv[place] "breaks her out". I encouraged pt to continue with condoms exclusively if she does not want to become pregnant. She voiced understanding of all information given.

## 2023-01-13 NOTE — Telephone Encounter (Signed)
Patient called in stated that she was seen here on 12/13/22 and is waiting for someone to call her regarding getting her tubes tied and she has not received a call.

## 2023-01-20 ENCOUNTER — Telehealth: Payer: Self-pay

## 2023-01-20 NOTE — Telephone Encounter (Signed)
Error

## 2023-01-24 ENCOUNTER — Telehealth: Payer: Self-pay

## 2023-01-24 NOTE — Telephone Encounter (Signed)
Called patient to see if she was available for surgery on 03/07/23 w/ Dr. Briscoe Deutscher. Left voicemail asking patient to call me back to schedule.

## 2023-01-24 NOTE — Telephone Encounter (Signed)
Patient returned my phone call and agreed to schedule her procedure w/ Dr. Briscoe Deutscher on 03/07/23 at 7:30 am @[facility] . I provided pre-op instruction and surgery details. Patient confirmed understanding.

## 2023-02-24 ENCOUNTER — Encounter (HOSPITAL_BASED_OUTPATIENT_CLINIC_OR_DEPARTMENT_OTHER): Payer: Self-pay | Admitting: Obstetrics and Gynecology

## 2023-02-24 NOTE — Progress Notes (Signed)
Spoke w/ via phone for pre-op interview--- [employer]----       UPT per anesthesia, surgeon orders pending. Lab results------ COVID test -----patient states asymptomatic no test needed Arrive at -------0530 NPO after MN NO Solid Food.   Med rec completed Medications to take morning of surgery -----NONE Diabetic medication ----- Patient instructed no nail polish to be worn day of surgery Patient instructed to bring photo id and insurance card day of surgery Patient aware to have Driver (ride ) / caregiver    for 24 hours after surgery - Husband French Ana Patient Special Instructions ----- Pre-Op special Instructions ----- Patient verbalized understanding of instructions that were given at this phone interview. Patient denies chest pain, sob, fever, cough at the interview.

## 2023-03-04 NOTE — Anesthesia Preprocedure Evaluation (Signed)
Anesthesia Evaluation  Patient identified by MRN, date of birth, ID band Patient awake    Reviewed: Allergy & Precautions, NPO status , Patient's Chart, lab work & pertinent test results  Airway Mallampati: I  TM Distance: >3 FB Neck ROM: Full    Dental no notable dental hx.    Pulmonary neg pulmonary ROS   Pulmonary exam normal        Cardiovascular negative cardio ROS  Rhythm:Regular Rate:Normal     Neuro/Psych negative neurological ROS  negative psych ROS   GI/Hepatic negative GI ROS, Neg liver ROS,,,  Endo/Other  negative endocrine ROS    Renal/GU negative Renal ROS  Female GU complaint     Musculoskeletal negative musculoskeletal ROS (+)    Abdominal Normal abdominal exam  (+)   Peds  Hematology  (+) Blood dyscrasia, anemia   Anesthesia Other Findings   Reproductive/Obstetrics                             Anesthesia Physical Anesthesia Plan  ASA: 2  Anesthesia Plan: General   Post-op Pain Management: Celebrex PO (pre-op)* and Tylenol PO (pre-op)*   Induction: Intravenous  PONV Risk Score and Plan: 3 and Ondansetron, Dexamethasone, Midazolam and Treatment may vary due to age or medical condition  Airway Management Planned: Mask and Oral ETT  Additional Equipment: None  Intra-op Plan:   Post-operative Plan: Extubation in OR  Informed Consent: I have reviewed the patients History and Physical, chart, labs and discussed the procedure including the risks, benefits and alternatives for the proposed anesthesia with the patient or authorized representative who has indicated his/her understanding and acceptance.     Dental advisory given  Plan Discussed with: CRNA  Anesthesia Plan Comments:        Anesthesia Quick Evaluation

## 2023-03-07 ENCOUNTER — Other Ambulatory Visit: Payer: Self-pay

## 2023-03-07 ENCOUNTER — Ambulatory Visit (HOSPITAL_BASED_OUTPATIENT_CLINIC_OR_DEPARTMENT_OTHER): Payer: Self-pay | Admitting: Anesthesiology

## 2023-03-07 ENCOUNTER — Encounter (HOSPITAL_BASED_OUTPATIENT_CLINIC_OR_DEPARTMENT_OTHER)
Admission: RE | Disposition: A | Discharge status: Home / Self Care | Payer: Self-pay | Source: Home / Self Care | Attending: Obstetrics and Gynecology

## 2023-03-07 ENCOUNTER — Encounter (HOSPITAL_BASED_OUTPATIENT_CLINIC_OR_DEPARTMENT_OTHER): Payer: Self-pay | Admitting: Obstetrics and Gynecology

## 2023-03-07 ENCOUNTER — Ambulatory Visit (HOSPITAL_BASED_OUTPATIENT_CLINIC_OR_DEPARTMENT_OTHER)
Admission: RE | Admit: 2023-03-07 | Discharge: 2023-03-07 | Disposition: A | Discharge status: Home / Self Care | Payer: Medicaid Other | Attending: Obstetrics and Gynecology | Admitting: Obstetrics and Gynecology

## 2023-03-07 DIAGNOSIS — D649 Anemia, unspecified: Secondary | ICD-10-CM | POA: Insufficient documentation

## 2023-03-07 DIAGNOSIS — Z302 Encounter for sterilization: Secondary | ICD-10-CM | POA: Diagnosis not present

## 2023-03-07 DIAGNOSIS — N838 Other noninflammatory disorders of ovary, fallopian tube and broad ligament: Secondary | ICD-10-CM | POA: Diagnosis not present

## 2023-03-07 DIAGNOSIS — Z01818 Encounter for other preprocedural examination: Secondary | ICD-10-CM

## 2023-03-07 HISTORY — PX: LAPAROSCOPIC BILATERAL SALPINGECTOMY: SHX5889

## 2023-03-07 LAB — TYPE AND SCREEN
ABO/RH(D): B POS
Antibody Screen: NEGATIVE

## 2023-03-07 LAB — ABO/RH: ABO/RH(D): B POS

## 2023-03-07 LAB — POCT PREGNANCY, URINE: Preg Test, Ur: NEGATIVE

## 2023-03-07 SURGERY — SALPINGECTOMY, BILATERAL, LAPAROSCOPIC
Anesthesia: General | Laterality: Bilateral

## 2023-03-07 MED ORDER — ROCURONIUM BROMIDE 100 MG/10ML IV SOLN
INTRAVENOUS | Status: DC | PRN
  Administered 2023-03-07: 50 mg via INTRAVENOUS

## 2023-03-07 MED ORDER — BUPIVACAINE HCL (PF) 0.5 % IJ SOLN
INTRAMUSCULAR | Status: DC | PRN
  Administered 2023-03-07: 19 mL

## 2023-03-07 MED ORDER — CELECOXIB 200 MG PO CAPS
ORAL_CAPSULE | ORAL | Status: AC
  Filled 2023-03-07: qty 1

## 2023-03-07 MED ORDER — LACTATED RINGERS IV SOLN: INTRAVENOUS | Status: DC

## 2023-03-07 MED ORDER — GABAPENTIN 300 MG PO CAPS
ORAL_CAPSULE | ORAL | Status: AC
  Filled 2023-03-07: qty 1

## 2023-03-07 MED ORDER — SODIUM CHLORIDE 0.9 % IR SOLN
Status: DC | PRN
  Administered 2023-03-07: 1000 mL

## 2023-03-07 MED ORDER — DROPERIDOL 2.5 MG/ML IJ SOLN: 0.6250 mg | Freq: Once | INTRAMUSCULAR | Status: DC | PRN

## 2023-03-07 MED ORDER — ACETAMINOPHEN 500 MG PO TABS
ORAL_TABLET | ORAL | Status: AC
  Filled 2023-03-07: qty 2

## 2023-03-07 MED ORDER — FENTANYL CITRATE (PF) 100 MCG/2ML IJ SOLN
INTRAMUSCULAR | Status: DC | PRN
  Administered 2023-03-07 (×2): 50 ug via INTRAVENOUS

## 2023-03-07 MED ORDER — OXYCODONE HCL 5 MG PO TABS: 5.0000 mg | ORAL_TABLET | ORAL | 0 refills | Status: DC | PRN

## 2023-03-07 MED ORDER — ONDANSETRON HCL 4 MG/2ML IJ SOLN
INTRAMUSCULAR | Status: DC | PRN
  Administered 2023-03-07: 4 mg via INTRAVENOUS

## 2023-03-07 MED ORDER — GABAPENTIN 300 MG PO CAPS
300.0000 mg | ORAL_CAPSULE | ORAL | Status: AC
  Administered 2023-03-07: 300 mg via ORAL

## 2023-03-07 MED ORDER — DEXMEDETOMIDINE HCL IN NACL 80 MCG/20ML IV SOLN
INTRAVENOUS | Status: DC | PRN
  Administered 2023-03-07 (×2): 4 ug via INTRAVENOUS

## 2023-03-07 MED ORDER — SCOPOLAMINE 1 MG/3DAYS TD PT72
1.0000 | MEDICATED_PATCH | TRANSDERMAL | Status: DC
  Administered 2023-03-07: 1.5 mg via TRANSDERMAL

## 2023-03-07 MED ORDER — IBUPROFEN 600 MG PO TABS: 600.0000 mg | ORAL_TABLET | Freq: Four times a day (QID) | ORAL | 3 refills | Status: DC | PRN

## 2023-03-07 MED ORDER — MIDAZOLAM HCL 2 MG/2ML IJ SOLN
INTRAMUSCULAR | Status: AC
  Filled 2023-03-07: qty 2

## 2023-03-07 MED ORDER — POLYETHYLENE GLYCOL 3350 17 G PO PACK: 17.0000 g | PACK | Freq: Every day | ORAL | 1 refills | Status: DC

## 2023-03-07 MED ORDER — ACETAMINOPHEN 500 MG PO TABS
1000.0000 mg | ORAL_TABLET | ORAL | Status: AC
  Administered 2023-03-07: 1000 mg via ORAL

## 2023-03-07 MED ORDER — CELECOXIB 200 MG PO CAPS
200.0000 mg | ORAL_CAPSULE | Freq: Once | ORAL | Status: AC
  Administered 2023-03-07: 200 mg via ORAL

## 2023-03-07 MED ORDER — POVIDONE-IODINE 10 % EX SWAB: 2.0000 | Freq: Once | CUTANEOUS | Status: DC

## 2023-03-07 MED ORDER — MIDAZOLAM HCL 2 MG/2ML IJ SOLN
INTRAMUSCULAR | Status: DC | PRN
  Administered 2023-03-07: 2 mg via INTRAVENOUS

## 2023-03-07 MED ORDER — DEXAMETHASONE SODIUM PHOSPHATE 4 MG/ML IJ SOLN
INTRAMUSCULAR | Status: DC | PRN
  Administered 2023-03-07: 8 mg via INTRAVENOUS

## 2023-03-07 MED ORDER — OXYCODONE HCL 5 MG PO TABS: 5.0000 mg | ORAL_TABLET | Freq: Once | ORAL | Status: DC | PRN

## 2023-03-07 MED ORDER — SCOPOLAMINE 1 MG/3DAYS TD PT72
MEDICATED_PATCH | TRANSDERMAL | Status: AC
  Filled 2023-03-07: qty 1

## 2023-03-07 MED ORDER — OXYCODONE HCL 5 MG/5ML PO SOLN: 5.0000 mg | Freq: Once | ORAL | Status: DC | PRN

## 2023-03-07 MED ORDER — LIDOCAINE HCL (CARDIAC) PF 100 MG/5ML IV SOSY
PREFILLED_SYRINGE | INTRAVENOUS | Status: DC | PRN
  Administered 2023-03-07: 50 mg via INTRAVENOUS

## 2023-03-07 MED ORDER — ACETAMINOPHEN 500 MG PO TABS: 500.0000 mg | ORAL_TABLET | Freq: Four times a day (QID) | ORAL | 1 refills | Status: DC | PRN

## 2023-03-07 MED ORDER — FENTANYL CITRATE (PF) 100 MCG/2ML IJ SOLN: 25.0000 ug | INTRAMUSCULAR | Status: DC | PRN

## 2023-03-07 MED ORDER — SUGAMMADEX SODIUM 200 MG/2ML IV SOLN
INTRAVENOUS | Status: DC | PRN
  Administered 2023-03-07: 200 mg via INTRAVENOUS

## 2023-03-07 MED ORDER — FENTANYL CITRATE (PF) 100 MCG/2ML IJ SOLN
INTRAMUSCULAR | Status: AC
  Filled 2023-03-07: qty 2

## 2023-03-07 MED ORDER — PROPOFOL 10 MG/ML IV BOLUS
INTRAVENOUS | Status: DC | PRN
  Administered 2023-03-07: 130 mg via INTRAVENOUS

## 2023-03-07 SURGICAL SUPPLY — 43 items
ADH SKN CLS APL DERMABOND .7 (GAUZE/BANDAGES/DRESSINGS) ×1
APL SRG 38 LTWT LNG FL B (MISCELLANEOUS)
APPLICATOR ARISTA FLEXITIP XL (MISCELLANEOUS) IMPLANT
CABLE HIGH FREQUENCY MONO STRZ (ELECTRODE) IMPLANT
COVER MAYO STAND STRL (DRAPES) ×1 IMPLANT
DERMABOND ADVANCED .7 DNX12 (GAUZE/BANDAGES/DRESSINGS) ×1 IMPLANT
DRAPE SURG IRRIG POUCH 19X23 (DRAPES) ×1 IMPLANT
DURAPREP 26ML APPLICATOR (WOUND CARE) ×1 IMPLANT
GAUZE 4X4 16PLY ~~LOC~~+RFID DBL (SPONGE) IMPLANT
GLOVE BIO SURGEON STRL SZ7 (GLOVE) ×2 IMPLANT
GLOVE BIOGEL PI IND STRL 7.0 (GLOVE) ×2 IMPLANT
GOWN STRL REUS W/ TWL XL LVL3 (GOWN DISPOSABLE) ×1 IMPLANT
GOWN STRL REUS W/TWL XL LVL3 (GOWN DISPOSABLE) ×1
IRRIG SUCT STRYKERFLOW 2 WTIP (MISCELLANEOUS) ×1
IRRIGATION SUCT STRKRFLW 2 WTP (MISCELLANEOUS) ×1 IMPLANT
KIT PINK PAD W/HEAD ARE REST (MISCELLANEOUS) ×1
KIT PINK PAD W/HEAD ARM REST (MISCELLANEOUS) ×1 IMPLANT
KIT TURNOVER CYSTO (KITS) ×1 IMPLANT
LIGASURE BLUNT TIP 5 LONG 44CM (ELECTROSURGICAL) IMPLANT
MANIPULATOR UTERINE 4.5 ZUMI (MISCELLANEOUS) IMPLANT
NDL INSUFFLATION 14GA 120MM (NEEDLE) ×1 IMPLANT
NEEDLE INSUFFLATION 14GA 120MM (NEEDLE) ×1
NS IRRIG 1000ML POUR BTL (IV SOLUTION) ×1 IMPLANT
PACK LAPAROSCOPY BASIN (CUSTOM PROCEDURE TRAY) ×1 IMPLANT
POUCH LAPAROSCOPIC INSTRUMENT (MISCELLANEOUS) ×1 IMPLANT
SET TRI-LUMEN FLTR TB AIRSEAL (TUBING) IMPLANT
SET TUBE SMOKE EVAC HIGH FLOW (TUBING) IMPLANT
SHEARS HARMONIC 36 ACE (MISCELLANEOUS) IMPLANT
SLEEVE SCD COMPRESS KNEE MED (STOCKING) ×1 IMPLANT
SLEEVE SURGEON STRL (DRAPES) IMPLANT
SLEEVE Z-THREAD 5X100MM (TROCAR) ×1 IMPLANT
SUT VIC AB 4-0 PS2 18 (SUTURE) ×1 IMPLANT
SUT VICRYL 0 UR6 27IN ABS (SUTURE) IMPLANT
SYR 10ML LL (SYRINGE) ×1 IMPLANT
SYS BAG RETRIEVAL 10MM (BASKET)
SYSTEM BAG RETRIEVAL 10MM (BASKET) IMPLANT
SYSTEM CARTER THOMASON II (TROCAR) IMPLANT
TOWEL OR 17X24 6PK STRL BLUE (TOWEL DISPOSABLE) ×1 IMPLANT
TRAY FOLEY W/BAG SLVR 14FR LF (SET/KITS/TRAYS/PACK) ×1 IMPLANT
TROCAR PORT AIRSEAL 5X120 (TROCAR) IMPLANT
TROCAR Z-THREAD BLADED 11X100M (TROCAR) ×1 IMPLANT
TROCAR Z-THREAD FIOS 5X100MM (TROCAR) ×1 IMPLANT
WARMER LAPAROSCOPE (MISCELLANEOUS) ×1 IMPLANT

## 2023-03-07 NOTE — Anesthesia Postprocedure Evaluation (Signed)
Anesthesia Post Note  Patient: Shirley Baker  Procedure(s) Performed: LAPAROSCOPIC BILATERAL SALPINGECTOMY (Bilateral)     Patient location during evaluation: PACU Anesthesia Type: General Level of consciousness: awake and alert Pain management: pain level controlled Vital Signs Assessment: post-procedure vital signs reviewed and stable Respiratory status: spontaneous breathing, nonlabored ventilation, respiratory function stable and patient connected to nasal cannula oxygen Cardiovascular status: blood pressure returned to baseline and stable Postop Assessment: no apparent nausea or vomiting Anesthetic complications: no   No notable events documented.  Last Vitals:  Vitals:   03/07/23 1000 03/07/23 1030  BP: 107/78 107/87  Pulse: (!) 56 (!) 54  Resp: 15 14  Temp:  36.4 C  SpO2: 100% 100%    Last Pain:  Vitals:   03/07/23 1030  TempSrc:   PainSc: 0-No pain                 Earl Lites P Adrieana Fennelly

## 2023-03-07 NOTE — Discharge Instructions (Addendum)
Do NOT takeTylenol until after 12:45 today.    Post-surgical Instructions, Outpatient Surgery  You may expect to feel dizzy, weak, and drowsy for as long as 24 hours after receiving the medicine that made you sleep (anesthetic). For the first 24 hours after your surgery:   Do not drive a car, ride a bicycle, participate in physical activities, or take public transportation until you are done taking narcotic pain medicines or as directed by Dr. Briscoe Deutscher.  Do not drink alcohol or take tranquilizers.  Do not take medicine that has not been prescribed by your physicians.  Do not sign important papers or make important decisions while on narcotic pain medicines.  Have a responsible person with you.   CARE OF INCISION If you have a bandage, you may remove it in one day.  If there are steri-strips or dermabond, just let this loosen on its own.  You may shower on the first day after your surgery.  Do not sit in a tub bath for one week. Avoid heavy lifting (more than 10 pounds/4.5 kilograms), pushing, or pulling.  Avoid activities that may risk injury to your incisions.   PAIN MANAGEMENT Ibuprofen 800mg .  (This is the same as 4-200mg  over the counter tablets of Motrin or ibuprofen.)  Take this every 6 hours or as needed for cramping.   Acetaminophen 1000mg  (This is the same as 2-500mg  over the counter extra strength tylenol). Take this every 6 hours for the first 3 days or as needed afterwards for pain Oxycodone 5mg  For more severe pain, take one or two tablets every four to six hours as needed for pain control.  (Remember that narcotic pain medications increase your risk of constipation.  If this becomes a problem, you may take an over the counter laxative like miralax.)  DO'S AND DON'T'S Do not take a tub bath for 2 weeks.  You may shower on the first day after your surgery Do not do any heavy lifting for one to two weeks.  This increases the chance of bleeding. Do move around as you feel able.   Stairs are fine.  You may begin to exercise again as you feel able.  Do not lift any weights for two weeks. Do not put anything in the vagina for two weeks--no tampons, intercourse, or douching.    REGULAR MEDIATIONS/VITAMINS: You may restart all of your regular medications as prescribed. You may restart all of your vitamins as you normally take them.    PLEASE CALL OR SEEK MEDICAL CARE IF: You have persistent nausea and vomiting.  You have trouble eating or drinking.  You have an oral temperature above 100.5.  You have constipation that is not helped by adjusting diet or increasing fluid intake. Pain medicines are a common cause of constipation.  You have heavy vaginal bleeding You have redness or drainage from your incision(s) or there is increasing pain or tenderness near or in the surgical site.

## 2023-03-07 NOTE — H&P (Signed)
OB/GYN Pre-Op History and Physical  Shirley Baker is a 32 y.o. [id number] presenting for permanent sterilization.       Past Medical History:  Diagnosis Date   Anemia    Delivery by classical cesarean section 02/10/2020   J incision on uterus due to fetal malpresentation and difficulty with delivery NEEDS DELIVERY AT 36-37 WEEKS FOR SUBSEQUENT PREGNANCIES   Heart murmur    Ovarian cyst    Syncope     Past Surgical History:  Procedure Laterality Date   CESAREAN SECTION N/A 02/10/2020   Procedure: CESAREAN SECTION;  Surgeon: Tereso Newcomer, MD;  Location: [facility] LD ORS;  Service: Obstetrics;  Laterality: N/A;   NO PAST SURGERIES      OB History  Gravida Para Term Preterm AB Living  4 4 4  0 0 4  SAB IAB Ectopic Multiple Live Births    0 0 0 4    # Outcome Date GA Lbr Len/2nd Weight Sex Type Anes PTL Lv  4 Term 02/10/20 [redacted]w[redacted]d  2730 g M CS-Classical Spinal  LIV  3 Term 10/07/18 [redacted]w[redacted]d  2860 g M Vag-Spont EPI N LIV  2 Term 11/10/15 [redacted]w[redacted]d 25:23 / 00:03 2265 g F Vag-Spont None  LIV     Birth Comments: wnl except sga  1 Term 07/01/10 [redacted]w[redacted]d  2722 g F Vag-Spont   LIV     Birth Comments: some high blood pressure during pregnancy, but not on meds    Obstetric Comments  LBW    Social History   Socioeconomic History   Marital status: Single    Spouse name: Not on file   Number of children: 2   Years of education: Not on file   Highest education level: Associate degree: occupational, Scientist, product/process development, or vocational program  Occupational History   Not on file  Tobacco Use   Smoking status: Never   Smokeless tobacco: Never  Vaping Use   Vaping status: Never Used  Substance and Sexual Activity   Alcohol use: No   Drug use: No   Sexual activity: Yes    Birth control/protection: Other-see comments  Other Topics Concern   Not on file  Social History Narrative   Not on file   Social Determinants of Health   Financial Resource Strain: Low Risk  (04/11/2018)   Overall Financial  Resource Strain (CARDIA)    Difficulty of Paying Living Expenses: Not hard at all  Food Insecurity: No Food Insecurity (08/16/2019)   Hunger Vital Sign    Worried About Running Out of Food in the Last Year: Never true    Ran Out of Food in the Last Year: Never true  Transportation Needs: No Transportation Needs (08/16/2019)   PRAPARE - Administrator, Civil Service (Medical): No    Lack of Transportation (Non-Medical): No  Physical Activity: Unknown (04/11/2018)   Exercise Vital Sign    Days of Exercise per Week: 0 days    Minutes of Exercise per Session: Not on file  Stress: No Stress Concern Present (04/11/2018)   [employer] of Occupational Health - Occupational Stress Questionnaire    Feeling of Stress : Not at all  Social Connections: Somewhat Isolated (04/11/2018)   Social Connection and Isolation Panel [NHANES]    Frequency of Communication with Friends and Family: More than three times a week    Frequency of Social Gatherings with Friends and Family: Twice a week    Attends Religious Services: More than 4 times per year  Active Member of Clubs or Organizations: No    Attends Banker Meetings: Never    Marital Status: Never married    Family History  Problem Relation Age of Onset   Healthy Mother    Healthy Father    Hearing loss Neg Hx     Medications Prior to Admission  Medication Sig Dispense Refill Last Dose   etonogestrel-ethinyl estradiol (NUVARING) 0.12-0.015 MG/24HR vaginal ring Insert vaginally and leave in place for 4 consecutive weeks and switch to the next for continuous menstrual suppression 1 each 12    Moringa Oleifera (MORINGA PO) Take by mouth.       No Known Allergies  Review of Systems: Negative except for what is mentioned in HPI.     Physical Exam: BP 120/79   Pulse 67   Temp 97.9 F (36.6 C) (Oral)   Resp 16   Ht 5\' 1"  (1.549 m)   Wt 62.8 kg   LMP 03/01/2023 (Approximate)   SpO2 97%   Breastfeeding No    BMI 26.15 kg/m  CONSTITUTIONAL: well-developed, well-nourished female in no acute distress.  HENT:  Normocephalic, atraumatic, External right and left ear normal. Oropharynx is clear and moist EYES: Conjunctivae and EOM are normal. Pupils are equal, round, and reactive to light. No scleral icterus.  NECK: Normal range of motion, supple, no masses SKIN: Skin is warm and dry. No rash noted. Not diaphoretic. No erythema. No pallor. NEUROLGIC: Alert and oriented to person, place, and time. Normal reflexes, muscle tone coordination. No cranial nerve deficit noted. PSYCHIATRIC: Normal mood and affect. Normal behavior. Normal judgment and thought content. RESPIRATORY: normal effort PELVIC: Deferred MUSCULOSKELETAL: Normal range of motion. No edema and no tenderness. 2+ distal pulses.   Pertinent Labs/Studies:   Results for orders placed or performed during the hospital encounter of 03/07/23 (from the past 72 hour(s))  Pregnancy, urine POC     Status: None   Collection Time: 03/07/23  6:23 AM  Result Value Ref Range   Preg Test, Ur NEGATIVE NEGATIVE    Comment:        THE SENSITIVITY OF THIS METHODOLOGY IS >24 mIU/mL        Assessment and Plan :Shirley Baker is a 32 y.o. [id number] here for [facility].   She desires permanent sterilization. Discussed alternatives including LARC options and vasectomy. She declines these options. Discussed surgery of salpingectomy vs tubal ligation. She would like to do a salpingectomy.  Risks of surgery include but are not limited to: bleeding, infection, injury to surrounding organs/tissues (i.e. bowel/bladder/ureters), need for additional procedures, wound complications, hospital re-admission, regret,  conversion to open surgery, and VTE.  Reviewed restrictions and recovery following surgery    Shirley Baker, M.D. Minimally Invasive Gynecologic Surgery and Pelvic Pain Specialist Attending Obstetrician & Gynecologist, Faculty Practice Center for [employer], [facility]

## 2023-03-07 NOTE — Op Note (Signed)
Shirley Baker PROCEDURE DATE: 03/07/2023  PREOPERATIVE DIAGNOSIS: undesired fertility  POSTOPERATIVE DIAGNOSIS: undesired fertility PROCEDURE:    laparoscopic bilateral salpingectomy SURGEON: Lorriane Shire, MD ASSISTANT:  Felipe Drone, RN  An experienced assistant was required given the standard of surgical care given the complexity of the case.  This assistant was needed for exposure, dissection, suctioning, retraction, instrument exchange, and for overall help during the procedure.  INDICATIONS: 32 y.o. [id number] with undesired fertility.  Risks of surgery were discussed with the patient including but not limited to: bleeding which may require transfusion; infection which may require antibiotics; injury to surrounding organs; need for additional procedures including laparotomy;  and other postoperative/anesthesia complications. Written informed consent was obtained.    FINDINGS:  Normal external genitalia, 6 wk size mobile/immobile uterus with Normal contours.  Laparoscopically: normal upper abdominal survey, norma uterus, normal fallopian tubes, normal bilateral ovaries, bilatralureters seen, normal anterior cul de sac, normal posterior cul de sac   ANESTHESIA: General INTRAVENOUS FLUIDS:  300 ml of LR ESTIMATED BLOOD LOSS:  10 ml URINE OUTPUT: 150 ml SPECIMENS: bilateral fallopian tubes COMPLICATIONS:  None immediate.   PROCEDURE:  The patient was taken to the operating room where general anesthesia was obtained without difficulty.  She was then placed in the dorsal lithotomy position and prepared and draped in sterile fashion.  After an adequate timeout was performed, foley placed in the bladder and drained clear yellow urine throughout the case. A bivalved speculum was then placed in the patient's vagina,  uterine manipulator was then advanced into the uterus.  The speculum was removed from the vagina.  Attention was then turned to the patient's abdomen where a 5-mm skin incision  was made in the umbilicus.  The 5-mm trocar and sleeve were then advanced without difficulty with the laparoscope under direct visualization into the abdomen.  The abdomen was then insufflated with carbon dioxide gas.  Adequate pneumoperitoneum was obtained.  A survey of the patient's pelvis and abdomen revealed the findings above.  Bilateral 5-mm lower quadrant ports  were then placed under direct visualization after infiltration with local anesthetic.  The fallopian tubes were transected from the uterine attachments and the underlying mesosalpinx with the ligasure device allowing for bilateral salpingectomy.  The fallopian tubes were then removed from the abdomen under direct visualization.  The operative site was surveyed, and it was found to be hemostatic.   No intraoperative injury to other surrounding organs was noted.  The abdomen was desufflated and all instruments were then removed from the patient's abdomen.  All skin incisions were closed with 4-0 vicryl. They were also covered with dermabond.  The uterine manipulator was removed from the vagina without complications as well as the foley. The patient tolerated the procedure well.  Sponge, lap, and needle counts were correct times two.  The patient was then taken to the recovery room awake, extubated and in stable condition.  The patient will be discharged to home as per PACU criteria.  Routine postoperative instructions given.  She will follow up in the clinic in 2-3 weeks for postoperative evaluation.    Lorriane Shire, MD Minimally Invasive Gynecologic Surgery and Chronic Pelvic Pain Specialist Obstetrics and Gynecology, [facility] for [facility], [facility] 03/07/2023

## 2023-03-07 NOTE — Transfer of Care (Signed)
Immediate Anesthesia Transfer of Care Note  Patient: Shirley Baker  Procedure(s) Performed: LAPAROSCOPIC BILATERAL SALPINGECTOMY (Bilateral)  Patient Location: PACU  Anesthesia Type:General  Level of Consciousness: drowsy and patient cooperative  Airway & Oxygen Therapy: Patient Spontanous Breathing and Patient connected to nasal cannula oxygen  Post-op Assessment: Report given to RN and Post -op Vital signs reviewed and stable  Post vital signs: Reviewed and stable  Last Vitals:  Vitals Value Taken Time  BP 109/83 03/07/23 0854  Temp    Pulse 72 03/07/23 0856  Resp 16 03/07/23 0856  SpO2 100 % 03/07/23 0856  Vitals shown include unfiled device data.  Last Pain:  Vitals:   03/07/23 0640  TempSrc: Oral  PainSc: 0-No pain      Patients Stated Pain Goal: 3 (03/07/23 0640)  Complications: No notable events documented.

## 2023-03-07 NOTE — Brief Op Note (Signed)
03/07/2023  8:37 AM  PATIENT:  Shirley Baker  32 y.o. female  PRE-OPERATIVE DIAGNOSIS:  Undesired Fertility  POST-OPERATIVE DIAGNOSIS:  Undesired Fertility  PROCEDURE:  Procedure(s): LAPAROSCOPIC BILATERAL SALPINGECTOMY (Bilateral)  SURGEON:  Surgeons and Role:    Lorriane Shire, MD - Primary  PHYSICIAN ASSISTANT: n/a  ASSISTANTS: Chassity Steward, RN  ANESTHESIA:   local and general  EBL:  10 mL   BLOOD ADMINISTERED:none  DRAINS: none   LOCAL MEDICATIONS USED:  BUPIVICAINE   SPECIMEN:  Source of Specimen:  bilateral fallopian tubes  DISPOSITION OF SPECIMEN:  PATHOLOGY  COUNTS:  YES  TOURNIQUET:  * No tourniquets in log *  DICTATION: .Dragon Dictation and Note written in EPIC  PLAN OF CARE: Discharge to home after PACU  PATIENT DISPOSITION:  PACU - hemodynamically stable.   Delay start of Pharmacological VTE agent (>24hrs) due to surgical blood loss or risk of bleeding: not applicable

## 2023-03-07 NOTE — Anesthesia Procedure Notes (Signed)
Procedure Name: Intubation Date/Time: 03/07/2023 7:48 AM  Performed by: Earmon Phoenix, CRNAPre-anesthesia Checklist: Patient identified, Emergency Drugs available, Suction available, Patient being monitored and Timeout performed Patient Re-evaluated:Patient Re-evaluated prior to induction Oxygen Delivery Method: Circle system utilized Preoxygenation: Pre-oxygenation with 100% oxygen Induction Type: IV induction Ventilation: Mask ventilation without difficulty Laryngoscope Size: Mac and 3 Grade View: Grade I Tube type: Oral Tube size: 7.0 mm Number of attempts: 1 Airway Equipment and Method: Stylet Placement Confirmation: ETT inserted through vocal cords under direct vision, positive ETCO2 and breath sounds checked- equal and bilateral Secured at: 20 cm Tube secured with: Tape Dental Injury: Teeth and Oropharynx as per pre-operative assessment

## 2023-03-08 ENCOUNTER — Encounter (HOSPITAL_BASED_OUTPATIENT_CLINIC_OR_DEPARTMENT_OTHER): Payer: Self-pay | Admitting: Obstetrics and Gynecology

## 2023-03-08 LAB — SURGICAL PATHOLOGY

## 2023-03-18 DIAGNOSIS — L710 Perioral dermatitis: Secondary | ICD-10-CM | POA: Diagnosis not present

## 2023-03-31 ENCOUNTER — Telehealth: Payer: Medicaid Other | Admitting: Obstetrics and Gynecology

## 2023-03-31 DIAGNOSIS — Z09 Encounter for follow-up examination after completed treatment for conditions other than malignant neoplasm: Secondary | ICD-10-CM

## 2023-03-31 NOTE — Progress Notes (Signed)
GYNECOLOGY VIRTUAL VISIT ENCOUNTER NOTE  Provider location: [facility] for [facility]   Patient location: Home  I connected with Shirley Baker on 03/31/23 at  4:15 PM EDT by MyChart Video Encounter and verified that I am speaking with the correct person using two identifiers.   I discussed the limitations, risks, security and privacy concerns of performing an evaluation and management service virtually and the availability of in person appointments. I also discussed with the patient that there may be a patient responsible charge related to this service. The patient expressed understanding and agreed to proceed.   History:  Shirley Baker is a 32 y.o. [phone] female being evaluated today for postop. She denies any abnormal vaginal discharge, bleeding, pelvic pain or other concerns.  Has not needed pain meds since leaving the hospital. Some suture from RLQ incision but no other concerns.      Past Medical History:  Diagnosis Date   Anemia    Delivery by classical cesarean section 02/10/2020   J incision on uterus due to fetal malpresentation and difficulty with delivery NEEDS DELIVERY AT 36-37 WEEKS FOR SUBSEQUENT PREGNANCIES   Heart murmur    Ovarian cyst    Syncope    Past Surgical History:  Procedure Laterality Date   CESAREAN SECTION N/A 02/10/2020   Procedure: CESAREAN SECTION;  Surgeon: Tereso Newcomer, MD;  [facility]: [facility];  Service: Obstetrics;  Laterality: N/A;   LAPAROSCOPIC BILATERAL SALPINGECTOMY Bilateral 03/07/2023   Procedure: LAPAROSCOPIC BILATERAL SALPINGECTOMY;  Surgeon: Lorriane Shire, MD;  Location: [facility];  Service: Gynecology;  Laterality: Bilateral;   NO PAST SURGERIES     The following portions of the patient's history were reviewed and updated as appropriate: allergies, current medications, past family history, past medical history, past social history, past surgical history and problem list.   Health  Maintenance:      Component Value Date/Time   DIAGPAP (A) 05/13/2022 1010    - Atypical squamous cells of undetermined significance (ASC-US)   HPVHIGH Negative 05/13/2022 1010   ADEQPAP  05/13/2022 1010    Satisfactory for evaluation; transformation zone component PRESENT.    High Risk HPV: Positive  Adequacy:  Satisfactory for evaluation, transformation zone component PRESENT  Diagnosis:  Atypical squamous cells of undetermined significance (ASC-US)   Review of Systems:  Pertinent items noted in HPI and remainder of comprehensive ROS otherwise negative.  Physical Exam:   General:  Alert, oriented and cooperative. Patient appears to be in no acute distress.  Mental Status: Normal mood and affect. Normal behavior. Normal judgment and thought content.   Respiratory: Normal respiratory effort, no problems with respiration noted  Rest of physical exam deferred due to type of encounter  Labs and Imaging No results found for this or any previous visit (from the past 336 hour(s)). No results found.     Assessment and Plan:     1. Postop check Doing well. Benign pathology, follow up as needed continued routine wound care        I discussed the assessment and treatment plan with the patient. The patient was provided an opportunity to ask questions and all were answered. The patient agreed with the plan and demonstrated an understanding of the instructions.   The patient was advised to call back or seek an in-person evaluation/go to the ED if the symptoms worsen or if the condition fails to improve as anticipated.  I provided 5 minutes of face-to-face time during  this encounter.   Lorriane Shire, MD [facility] for [employer]

## 2023-05-29 ENCOUNTER — Other Ambulatory Visit: Payer: Self-pay | Admitting: Obstetrics and Gynecology

## 2023-05-29 DIAGNOSIS — Z30015 Encounter for initial prescription of vaginal ring hormonal contraceptive: Secondary | ICD-10-CM

## 2023-05-30 ENCOUNTER — Encounter: Payer: Self-pay | Admitting: Obstetrics and Gynecology

## 2023-06-17 DIAGNOSIS — L710 Perioral dermatitis: Secondary | ICD-10-CM | POA: Diagnosis not present

## 2023-06-17 DIAGNOSIS — T364X5A Adverse effect of tetracyclines, initial encounter: Secondary | ICD-10-CM | POA: Diagnosis not present

## 2023-06-17 DIAGNOSIS — L818 Other specified disorders of pigmentation: Secondary | ICD-10-CM | POA: Diagnosis not present

## 2023-12-16 ENCOUNTER — Telehealth: Payer: Self-pay | Admitting: Nurse Practitioner

## 2023-12-16 NOTE — Telephone Encounter (Signed)
 Contacted pt left vm to confirmed appt

## 2023-12-19 ENCOUNTER — Encounter: Payer: Self-pay | Admitting: Nurse Practitioner

## 2023-12-19 ENCOUNTER — Ambulatory Visit: Attending: Nurse Practitioner | Admitting: Nurse Practitioner

## 2023-12-19 VITALS — BP 130/75 | HR 70 | Resp 19 | Ht 61.0 in | Wt 140.8 lb

## 2023-12-19 DIAGNOSIS — Z23 Encounter for immunization: Secondary | ICD-10-CM | POA: Diagnosis not present

## 2023-12-19 DIAGNOSIS — Z0000 Encounter for general adult medical examination without abnormal findings: Secondary | ICD-10-CM | POA: Diagnosis not present

## 2023-12-19 NOTE — Progress Notes (Signed)
 Needs physical!

## 2023-12-19 NOTE — Progress Notes (Signed)
 Assessment & Plan:  Shirley Baker was seen today for medical management of chronic issues.  Diagnoses and all orders for this visit:  Encounter for annual physical exam -     CMP14+EGFR -     CBC with Differential  Need for hepatitis vaccination -     Heplisav-B  (HepB-CPG) Vaccine  Need for HPV vaccine -     HPV 9-valent vaccine,Recombinat    Patient has been counseled on age-appropriate routine health concerns for screening and prevention. These are reviewed and up-to-date. Referrals have been placed accordingly. Immunizations are up-to-date or declined.    Subjective:   Chief Complaint  Patient presents with   Medical Management of Chronic Issues    Shirley Baker 33 y.o. female presents to office today for annual physical exam.  Review of Systems  Constitutional:  Negative for fever, malaise/fatigue and weight loss.  HENT: Negative.  Negative for nosebleeds.   Eyes: Negative.  Negative for blurred vision, double vision and photophobia.  Respiratory: Negative.  Negative for cough and shortness of breath.   Cardiovascular: Negative.  Negative for chest pain, palpitations and leg swelling.  Gastrointestinal: Negative.  Negative for heartburn, nausea and vomiting.  Genitourinary: Negative.   Musculoskeletal: Negative.  Negative for myalgias.  Skin: Negative.   Neurological: Negative.  Negative for dizziness, focal weakness, seizures and headaches.  Endo/Heme/Allergies: Negative.   Psychiatric/Behavioral: Negative.  Negative for suicidal ideas.     Past Medical History:  Diagnosis Date   Anemia    Delivery by classical cesarean section 02/10/2020   J incision on uterus due to fetal malpresentation and difficulty with delivery NEEDS DELIVERY AT 36-37 WEEKS FOR SUBSEQUENT PREGNANCIES   Heart murmur    Ovarian cyst    Syncope     Past Surgical History:  Procedure Laterality Date   CESAREAN SECTION N/A 02/10/2020   Procedure: CESAREAN SECTION;  Surgeon: Herchel Gloris LABOR,  MD;  [facility];  Service: Obstetrics;  Laterality: N/A;   LAPAROSCOPIC BILATERAL SALPINGECTOMY Bilateral 03/07/2023   Procedure: LAPAROSCOPIC BILATERAL SALPINGECTOMY;  Surgeon: Jeralyn Crutch, MD;  Location: [facility];  Service: Gynecology;  Laterality: Bilateral;   NO PAST SURGERIES      Family History  Problem Relation Age of Onset   Healthy Mother    Healthy Father    Hearing loss Neg Hx     Social History Reviewed with no changes to be made today.   Outpatient Medications Prior to Visit  Medication Sig Dispense Refill   acetaminophen  (TYLENOL ) 500 MG tablet Take 1 tablet (500 mg total) by mouth every 6 (six) hours as needed. (Patient not taking: Reported on 12/19/2023) 30 tablet 1   ibuprofen  (ADVIL ) 600 MG tablet Take 1 tablet (600 mg total) by mouth every 6 (six) hours as needed. (Patient not taking: Reported on 12/19/2023) 60 tablet 3   Moringa Oleifera (MORINGA PO) Take by mouth. (Patient not taking: Reported on 12/19/2023)     oxyCODONE  (OXY IR/ROXICODONE ) 5 MG immediate release tablet Take 1 tablet (5 mg total) by mouth every 4 (four) hours as needed for severe pain or breakthrough pain. (Patient not taking: Reported on 12/19/2023) 12 tablet 0   polyethylene glycol (MIRALAX  / GLYCOLAX ) 17 g packet Take 17 g by mouth daily. (Patient not taking: Reported on 12/19/2023) 14 each 1   No facility-administered medications prior to visit.    No Known Allergies     Objective:    BP 130/75 (BP Location: Left Arm, Patient Position:  Sitting, Cuff Size: Normal)   Pulse 70   Resp 19   Ht 5' 1 (1.549 m)   Wt 140 lb 12.8 oz (63.9 kg)   LMP 11/29/2023 (Approximate)   SpO2 100%   BMI 26.60 kg/m  Wt Readings from Last 3 Encounters:  12/19/23 140 lb 12.8 oz (63.9 kg)  03/07/23 138 lb 6.4 oz (62.8 kg)  12/13/22 138 lb (62.6 kg)    Physical Exam Constitutional:      Appearance: She is well-developed.  HENT:     Head: Normocephalic and atraumatic.      Right Ear: Hearing, tympanic membrane, ear canal and external ear normal.     Left Ear: Hearing, tympanic membrane, ear canal and external ear normal.     Nose: Nose normal.     Right Turbinates: Not enlarged.     Left Turbinates: Not enlarged.     Mouth/Throat:     Lips: Pink.     Mouth: Mucous membranes are moist.     Dentition: No dental tenderness, gingival swelling, dental abscesses or gum lesions.     Pharynx: No oropharyngeal exudate.  Eyes:     General: No scleral icterus.       Right eye: No discharge.     Extraocular Movements: Extraocular movements intact.     Conjunctiva/sclera: Conjunctivae normal.     Pupils: Pupils are equal, round, and reactive to light.  Neck:     Thyroid : No thyromegaly.     Trachea: No tracheal deviation.  Cardiovascular:     Rate and Rhythm: Normal rate and regular rhythm.     Heart sounds: Normal heart sounds. No murmur heard.    No friction rub.  Pulmonary:     Effort: Pulmonary effort is normal. No accessory muscle usage or respiratory distress.     Breath sounds: Normal breath sounds. No decreased breath sounds, wheezing, rhonchi or rales.  Abdominal:     General: Bowel sounds are normal. There is no distension.     Palpations: Abdomen is soft. There is no mass.     Tenderness: There is no abdominal tenderness. There is no right CVA tenderness, left CVA tenderness, guarding or rebound.     Hernia: No hernia is present.  Musculoskeletal:        General: No tenderness or deformity. Normal range of motion.     Cervical back: Normal range of motion and neck supple.  Lymphadenopathy:     Cervical: No cervical adenopathy.  Skin:    General: Skin is warm and dry.     Findings: No erythema.  Neurological:     Mental Status: She is alert and oriented to person, place, and time.     Cranial Nerves: No cranial nerve deficit.     Motor: Motor function is intact.     Coordination: Coordination is intact. Coordination normal.     Gait: Gait is  intact.     Deep Tendon Reflexes:     Reflex Scores:      Patellar reflexes are 1+ on the right side and 1+ on the left side. Psychiatric:        Attention and Perception: Attention normal.        Mood and Affect: Mood normal.        Speech: Speech normal.        Behavior: Behavior normal.        Thought Content: Thought content normal.        Judgment: Judgment normal.  Patient has been counseled extensively about nutrition and exercise as well as the importance of adherence with medications and regular follow-up. The patient was given clear instructions to go to ER or return to medical center if symptoms don't improve, worsen or new problems develop. The patient verbalized understanding.   Follow-up: Return if symptoms worsen or fail to improve.   Haze LELON Servant, FNP-BC [facility] [place], [place] [phone]   12/19/2023, 2:50 PM

## 2023-12-20 LAB — CBC WITH DIFFERENTIAL/PLATELET
Basophils Absolute: 0 x10E3/uL (ref 0.0–0.2)
Basos: 1 %
EOS (ABSOLUTE): 0.1 x10E3/uL (ref 0.0–0.4)
Eos: 1 %
Hematocrit: 44.4 % (ref 34.0–46.6)
Hemoglobin: 14.4 g/dL (ref 11.1–15.9)
Immature Grans (Abs): 0 x10E3/uL (ref 0.0–0.1)
Immature Granulocytes: 0 %
Lymphocytes Absolute: 2.6 x10E3/uL (ref 0.7–3.1)
Lymphs: 43 %
MCH: 31.4 pg (ref 26.6–33.0)
MCHC: 32.4 g/dL (ref 31.5–35.7)
MCV: 97 fL (ref 79–97)
Monocytes Absolute: 0.6 x10E3/uL (ref 0.1–0.9)
Monocytes: 10 %
Neutrophils Absolute: 2.8 x10E3/uL (ref 1.4–7.0)
Neutrophils: 45 %
Platelets: 228 x10E3/uL (ref 150–450)
RBC: 4.58 x10E6/uL (ref 3.77–5.28)
RDW: 12.3 % (ref 11.7–15.4)
WBC: 6.1 x10E3/uL (ref 3.4–10.8)

## 2023-12-20 LAB — CMP14+EGFR
ALT: 15 IU/L (ref 0–32)
AST: 17 IU/L (ref 0–40)
Albumin: 4.1 g/dL (ref 3.9–4.9)
Alkaline Phosphatase: 53 IU/L (ref 44–121)
BUN/Creatinine Ratio: 16 (ref 9–23)
BUN: 11 mg/dL (ref 6–20)
Bilirubin Total: 0.4 mg/dL (ref 0.0–1.2)
CO2: 21 mmol/L (ref 20–29)
Calcium: 9.1 mg/dL (ref 8.7–10.2)
Chloride: 106 mmol/L (ref 96–106)
Creatinine, Ser: 0.7 mg/dL (ref 0.57–1.00)
Globulin, Total: 2.4 g/dL (ref 1.5–4.5)
Glucose: 80 mg/dL (ref 70–99)
Potassium: 4.5 mmol/L (ref 3.5–5.2)
Sodium: 139 mmol/L (ref 134–144)
Total Protein: 6.5 g/dL (ref 6.0–8.5)
eGFR: 117 mL/min/1.73 (ref >59)

## 2024-01-07 ENCOUNTER — Ambulatory Visit: Payer: Self-pay | Admitting: Nurse Practitioner

## 2024-01-20 ENCOUNTER — Ambulatory Visit

## 2024-03-20 ENCOUNTER — Ambulatory Visit: Attending: Nurse Practitioner

## 2024-03-20 DIAGNOSIS — Z111 Encounter for screening for respiratory tuberculosis: Secondary | ICD-10-CM

## 2024-03-20 NOTE — Progress Notes (Unsigned)
 PPD Placement note Shirley Baker, 33 y.o. female is here today for placement of PPD test Reason for PPD test: education Pt taken PPD test before: yes Has the patient ever received the BCG vaccine?: no Has the patient been in recent contact with anyone known or suspected of having active TB disease?: no  PPD placed on 03/20/2024.  Patient advised to return for reading within 48-72 hours.

## 2024-03-22 ENCOUNTER — Ambulatory Visit: Attending: Nurse Practitioner

## 2024-03-22 ENCOUNTER — Ambulatory Visit: Payer: Self-pay | Admitting: Nurse Practitioner

## 2024-03-22 DIAGNOSIS — Z111 Encounter for screening for respiratory tuberculosis: Secondary | ICD-10-CM | POA: Diagnosis not present

## 2024-03-22 LAB — TB SKIN TEST
Induration: 19 mm
TB Skin Test: POSITIVE

## 2024-03-22 NOTE — Progress Notes (Signed)
 PPD Reading Note PPD read and results entered in EpicCare. Result: 19 mm induration. Interpretation: Positive  Patient is unsure weather or not she receive the BCG shot when she was a Child. Patient denies  persistent cough (lasting more than three weeks), chest pain, coughing up blood or sputum, fever, chills, night sweats, and unintentional weight loss.  QuantiFERON-TB Gold Plus ordered

## 2024-03-26 LAB — QUANTIFERON-TB GOLD PLUS
QuantiFERON Mitogen Value: >10 [IU]/mL
QuantiFERON Nil Value: 0.05 [IU]/mL
QuantiFERON TB1 Ag Value: 0.09 [IU]/mL
QuantiFERON TB2 Ag Value: 0.12 [IU]/mL
QuantiFERON-TB Gold Plus: NEGATIVE

## 2024-03-27 ENCOUNTER — Ambulatory Visit: Payer: Self-pay | Admitting: Nurse Practitioner

## 2024-06-08 ENCOUNTER — Ambulatory Visit: Payer: Self-pay | Attending: Nurse Practitioner | Admitting: Nurse Practitioner

## 2024-06-08 ENCOUNTER — Encounter: Payer: Self-pay | Admitting: Nurse Practitioner

## 2024-06-08 VITALS — BP 110/71 | HR 66 | Ht 61.0 in | Wt 137.0 lb

## 2024-06-08 DIAGNOSIS — Z23 Encounter for immunization: Secondary | ICD-10-CM | POA: Diagnosis not present

## 2024-06-08 DIAGNOSIS — M7989 Other specified soft tissue disorders: Secondary | ICD-10-CM

## 2024-06-08 NOTE — Progress Notes (Signed)
 "  Assessment & Plan:  Shirley Baker was seen today for underarm pain.  Diagnoses and all orders for this visit:  Right axillary swelling Diagnostic mammogram  Need for HPV vaccine -     HPV 9-valent vaccine,Recombinat  Left axillary swelling -Diagnostic mammogram    Patient has been counseled on age-appropriate routine health concerns for screening and prevention. These are reviewed and up-to-date. Referrals have been placed accordingly. Immunizations are up-to-date or declined.    Subjective:   Chief Complaint  Patient presents with   Underarm Pain    Right underarm pain and swelling. Otc medication used with no relief.    History of Present Illness Shirley Baker is a 34 year old female who presents with tender bilateral axillary breast tissue.   She reports having lumps in both armpits since adolescence. These lumps are soft and painful, particularly around her menstrual period. They are symmetrical in size and located on both sides of the outside lateral breast area and armpit.  The lumps become swollen and painful, sometimes feeling hard. She has been using over-the-counter Tylenol  for pain relief, which has been ineffective. She has not tried ibuprofen  yet. I have instructed her today that these areas are fatty pockets and due to being so close to the breast tissue she may experience tenderness before during or after her menstrual cycle due to hormonal changes.      Review of Systems  Constitutional:  Negative for fever, malaise/fatigue and weight loss.  Respiratory: Negative.  Negative for cough and shortness of breath.   Cardiovascular: Negative.  Negative for chest pain, palpitations and leg swelling.  Musculoskeletal: Negative.  Negative for myalgias.  Skin:        SEE HPI  Neurological: Negative.  Negative for dizziness, focal weakness, seizures and headaches.  Psychiatric/Behavioral: Negative.  Negative for suicidal ideas.     Past Medical History:  Diagnosis Date    Anemia    Delivery by classical cesarean section 02/10/2020   J incision on uterus due to fetal malpresentation and difficulty with delivery NEEDS DELIVERY AT 36-37 WEEKS FOR SUBSEQUENT PREGNANCIES   Heart murmur    Ovarian cyst    Syncope     Past Surgical History:  Procedure Laterality Date   CESAREAN SECTION N/A 02/10/2020   Procedure: CESAREAN SECTION;  Surgeon: Herchel Gloris LABOR, MD;  [facility]: [facility];  Service: Obstetrics;  Laterality: N/A;   LAPAROSCOPIC BILATERAL SALPINGECTOMY Bilateral 03/07/2023   Procedure: LAPAROSCOPIC BILATERAL SALPINGECTOMY;  Surgeon: Jeralyn Crutch, MD;  Location: [facility];  Service: Gynecology;  Laterality: Bilateral;   NO PAST SURGERIES      Family History  Problem Relation Age of Onset   Healthy Mother    Healthy Father    Hearing loss Neg Hx     Social History Reviewed with no changes to be made today.   Outpatient Medications Prior to Visit  Medication Sig Dispense Refill   acetaminophen  (TYLENOL ) 500 MG tablet Take 1 tablet (500 mg total) by mouth every 6 (six) hours as needed. (Patient not taking: Reported on 06/08/2024) 30 tablet 1   ibuprofen  (ADVIL ) 600 MG tablet Take 1 tablet (600 mg total) by mouth every 6 (six) hours as needed. (Patient not taking: Reported on 06/08/2024) 60 tablet 3   Moringa Oleifera (MORINGA PO) Take by mouth. (Patient not taking: Reported on 06/08/2024)     oxyCODONE  (OXY IR/ROXICODONE ) 5 MG immediate release tablet Take 1 tablet (5 mg total) by mouth every 4 (four)  hours as needed for severe pain or breakthrough pain. (Patient not taking: Reported on 06/08/2024) 12 tablet 0   polyethylene glycol (MIRALAX  / GLYCOLAX ) 17 g packet Take 17 g by mouth daily. (Patient not taking: Reported on 06/08/2024) 14 each 1   No facility-administered medications prior to visit.    Allergies[1]     Objective:    BP 110/71 (BP Location: Left Arm, Patient Position: Sitting, Cuff Size: Normal)   Pulse 66   Ht 5'  1 (1.549 m)   Wt 137 lb (62.1 kg)   LMP 06/01/2024 (Exact Date)   SpO2 100%   BMI 25.89 kg/m  Wt Readings from Last 3 Encounters:  06/08/24 137 lb (62.1 kg)  12/19/23 140 lb 12.8 oz (63.9 kg)  03/07/23 138 lb 6.4 oz (62.8 kg)    Physical Exam Vitals and nursing note reviewed.  Constitutional:      Appearance: She is well-developed.  HENT:     Head: Normocephalic and atraumatic.  Cardiovascular:     Rate and Rhythm: Normal rate and regular rhythm.     Heart sounds: Normal heart sounds. No murmur heard.    No friction rub. No gallop.  Pulmonary:     Effort: Pulmonary effort is normal. No tachypnea or respiratory distress.     Breath sounds: Normal breath sounds. No decreased breath sounds, wheezing, rhonchi or rales.  Chest:     Chest wall: Tenderness present. No mass, deformity or swelling.  Breasts:    Right: Normal.     Left: Normal.       Comments: Bilateral pockets of fatty tissue Musculoskeletal:        General: Normal range of motion.  Lymphadenopathy:     Upper Body:     Right upper body: No supraclavicular, axillary or pectoral adenopathy.     Left upper body: No supraclavicular, axillary or pectoral adenopathy.  Skin:    General: Skin is warm and dry.  Neurological:     Mental Status: She is alert and oriented to person, place, and time.     Coordination: Coordination normal.  Psychiatric:        Behavior: Behavior normal. Behavior is cooperative.        Thought Content: Thought content normal.        Judgment: Judgment normal.          Patient has been counseled extensively about nutrition and exercise as well as the importance of adherence with medications and regular follow-up. The patient was given clear instructions to go to ER or return to medical center if symptoms don't improve, worsen or new problems develop. The patient verbalized understanding.   Follow-up: Return if symptoms worsen or fail to improve.   Haze LELON Servant, FNP-BC [facility] [place], [place] [phone]   06/24/2024, 10:36 PM     [1] No Known Allergies  "

## 2024-06-08 NOTE — Patient Instructions (Signed)
 [facility] [address]  [phone]

## 2024-06-15 ENCOUNTER — Other Ambulatory Visit: Payer: Self-pay | Admitting: Nurse Practitioner

## 2024-06-15 DIAGNOSIS — Z23 Encounter for immunization: Secondary | ICD-10-CM

## 2024-06-15 DIAGNOSIS — M7989 Other specified soft tissue disorders: Secondary | ICD-10-CM

## 2024-06-20 ENCOUNTER — Ambulatory Visit
Admission: RE | Admit: 2024-06-20 | Discharge: 2024-06-20 | Disposition: A | Source: Ambulatory Visit | Attending: Nurse Practitioner

## 2024-06-20 ENCOUNTER — Ambulatory Visit

## 2024-06-20 ENCOUNTER — Ambulatory Visit: Payer: Self-pay | Admitting: Nurse Practitioner

## 2024-06-20 DIAGNOSIS — M7989 Other specified soft tissue disorders: Secondary | ICD-10-CM

## 2024-06-24 ENCOUNTER — Encounter: Payer: Self-pay | Admitting: Nurse Practitioner

## 2024-06-25 NOTE — Progress Notes (Signed)
 Vaccine documented.

## 2024-07-03 ENCOUNTER — Telehealth: Payer: Self-pay | Admitting: Nurse Practitioner

## 2024-07-03 NOTE — Telephone Encounter (Signed)
Pt sch appt today

## 2024-07-04 ENCOUNTER — Ambulatory Visit: Payer: Self-pay | Admitting: Nurse Practitioner

## 2024-07-04 ENCOUNTER — Encounter: Payer: Self-pay | Admitting: Nurse Practitioner

## 2024-07-04 VITALS — BP 108/71 | HR 65 | Temp 98.0°F | Ht 61.0 in | Wt 134.8 lb

## 2024-07-04 DIAGNOSIS — Z0000 Encounter for general adult medical examination without abnormal findings: Secondary | ICD-10-CM

## 2024-07-04 DIAGNOSIS — Z0001 Encounter for general adult medical examination with abnormal findings: Secondary | ICD-10-CM

## 2024-07-04 DIAGNOSIS — M79601 Pain in right arm: Secondary | ICD-10-CM

## 2024-07-04 DIAGNOSIS — L309 Dermatitis, unspecified: Secondary | ICD-10-CM

## 2024-07-04 DIAGNOSIS — Z01818 Encounter for other preprocedural examination: Secondary | ICD-10-CM

## 2024-07-04 MED ORDER — IBUPROFEN 600 MG PO TABS: 600.0000 mg | ORAL_TABLET | Freq: Four times a day (QID) | ORAL | 3 refills | Status: AC | PRN

## 2024-07-04 NOTE — Progress Notes (Unsigned)
 "  Assessment & Plan:  Shirley Baker was seen today for medical clearance.  Diagnoses and all orders for this visit:  Pre-operative clearance -     CBC -     PT AND PTT -     Comprehensive metabolic panel  Dermatitis -     Ambulatory referral to Dermatology  Right arm pain -     ibuprofen  (ADVIL ) 600 MG tablet; Take 1 tablet (600 mg total) by mouth every 6 (six) hours as needed.    Patient has been counseled on age-appropriate routine health concerns for screening and prevention. These are reviewed and up-to-date. Referrals have been placed accordingly. Immunizations are up-to-date or declined.    Subjective:   Chief Complaint  Patient presents with   Medical Clearance    Shirley Baker 34 y.o. female presents to office today      ROS  Past Medical History:  Diagnosis Date   Anemia    Delivery by classical cesarean section 02/10/2020   J incision on uterus due to fetal malpresentation and difficulty with delivery NEEDS DELIVERY AT 36-37 WEEKS FOR SUBSEQUENT PREGNANCIES   Heart murmur    Ovarian cyst    Syncope     Past Surgical History:  Procedure Laterality Date   CESAREAN SECTION N/A 02/10/2020   Procedure: CESAREAN SECTION;  Surgeon: Herchel Gloris LABOR, MD;  [facility];  Service: Obstetrics;  Laterality: N/A;   LAPAROSCOPIC BILATERAL SALPINGECTOMY Bilateral 03/07/2023   Procedure: LAPAROSCOPIC BILATERAL SALPINGECTOMY;  Surgeon: Jeralyn Crutch, MD;  Location: [facility];  Service: Gynecology;  Laterality: Bilateral;   NO PAST SURGERIES      Family History  Problem Relation Age of Onset   Healthy Mother    Healthy Father    Hearing loss Neg Hx     Social History Reviewed with no changes to be made today.   Outpatient Medications Prior to Visit  Medication Sig Dispense Refill   Moringa Oleifera (MORINGA PO) Take by mouth. (Patient not taking: Reported on 06/08/2024)     acetaminophen  (TYLENOL ) 500 MG tablet Take 1 tablet (500 mg total) by  mouth every 6 (six) hours as needed. (Patient not taking: Reported on 06/08/2024) 30 tablet 1   ibuprofen  (ADVIL ) 600 MG tablet Take 1 tablet (600 mg total) by mouth every 6 (six) hours as needed. (Patient not taking: Reported on 06/08/2024) 60 tablet 3   oxyCODONE  (OXY IR/ROXICODONE ) 5 MG immediate release tablet Take 1 tablet (5 mg total) by mouth every 4 (four) hours as needed for severe pain or breakthrough pain. (Patient not taking: Reported on 06/08/2024) 12 tablet 0   polyethylene glycol (MIRALAX  / GLYCOLAX ) 17 g packet Take 17 g by mouth daily. (Patient not taking: Reported on 06/08/2024) 14 each 1   No facility-administered medications prior to visit.    Allergies[1]     Objective:    BP 108/71 (BP Location: Right Arm, Patient Position: Sitting, Cuff Size: Normal)   Pulse 65   Temp 98 F (36.7 C) (Oral)   Ht 5' 1 (1.549 m)   Wt 134 lb 12.8 oz (61.1 kg)   LMP 06/27/2024 (Approximate)   SpO2 97%   BMI 25.47 kg/m  Wt Readings from Last 3 Encounters:  07/04/24 134 lb 12.8 oz (61.1 kg)  06/08/24 137 lb (62.1 kg)  12/19/23 140 lb 12.8 oz (63.9 kg)    Physical Exam       Patient has been counseled extensively about nutrition and exercise as well  as the importance of adherence with medications and regular follow-up. The patient was given clear instructions to go to ER or return to medical center if symptoms don't improve, worsen or new problems develop. The patient verbalized understanding.   Follow-up: No follow-ups on file.   Haze LELON Servant, FNP-BC [facility] [place], [place] [phone]   07/04/2024, 9:48 AM    [1] No Known Allergies  "

## 2024-07-05 ENCOUNTER — Ambulatory Visit: Payer: Self-pay | Admitting: Nurse Practitioner

## 2024-07-05 ENCOUNTER — Encounter: Payer: Self-pay | Admitting: Nurse Practitioner

## 2024-07-05 LAB — COMPREHENSIVE METABOLIC PANEL WITH GFR
ALT: 11 [IU]/L (ref 0–32)
AST: 16 [IU]/L (ref 0–40)
Albumin: 4.1 g/dL (ref 3.9–4.9)
Alkaline Phosphatase: 45 [IU]/L (ref 41–116)
BUN/Creatinine Ratio: 16 (ref 9–23)
BUN: 11 mg/dL (ref 6–20)
Bilirubin Total: 0.3 mg/dL (ref 0.0–1.2)
CO2: 21 mmol/L (ref 20–29)
Calcium: 9.6 mg/dL (ref 8.7–10.2)
Chloride: 107 mmol/L — ABNORMAL HIGH (ref 96–106)
Creatinine, Ser: 0.69 mg/dL (ref 0.57–1.00)
Globulin, Total: 2.4 g/dL (ref 1.5–4.5)
Glucose: 85 mg/dL (ref 70–99)
Potassium: 4.2 mmol/L (ref 3.5–5.2)
Sodium: 141 mmol/L (ref 134–144)
Total Protein: 6.5 g/dL (ref 6.0–8.5)
eGFR: 117 mL/min/{1.73_m2}

## 2024-07-05 LAB — CBC
Hematocrit: 44.6 % (ref 34.0–46.6)
Hemoglobin: 14.5 g/dL (ref 11.1–15.9)
MCH: 31.1 pg (ref 26.6–33.0)
MCHC: 32.5 g/dL (ref 31.5–35.7)
MCV: 96 fL (ref 79–97)
Platelets: 274 10*3/uL (ref 150–450)
RBC: 4.66 x10E6/uL (ref 3.77–5.28)
RDW: 12.7 % (ref 11.7–15.4)
WBC: 4.7 10*3/uL (ref 3.4–10.8)

## 2024-07-05 LAB — PT AND PTT
INR: 1 (ref 0.9–1.2)
Prothrombin Time: 10.8 s (ref 9.1–12.0)
aPTT: 26 s (ref 24–33)

## 2024-07-11 ENCOUNTER — Ambulatory Visit
